# Patient Record
Sex: Female | Born: 1995 | Race: White | Hispanic: No | Marital: Married | Smoking: Former smoker
Health system: Southern US, Community
[De-identification: ages and names within clinical notes are randomized; demographics above are authoritative.]

## PROBLEM LIST (undated history)

## (undated) ENCOUNTER — Inpatient Hospital Stay: Payer: Self-pay

## (undated) DIAGNOSIS — J309 Allergic rhinitis, unspecified: Secondary | ICD-10-CM

## (undated) DIAGNOSIS — N83209 Unspecified ovarian cyst, unspecified side: Secondary | ICD-10-CM

## (undated) DIAGNOSIS — R102 Pelvic and perineal pain unspecified side: Secondary | ICD-10-CM

## (undated) DIAGNOSIS — Z8619 Personal history of other infectious and parasitic diseases: Secondary | ICD-10-CM

## (undated) DIAGNOSIS — N939 Abnormal uterine and vaginal bleeding, unspecified: Secondary | ICD-10-CM

## (undated) HISTORY — DX: Pelvic and perineal pain: R10.2

## (undated) HISTORY — DX: Unspecified ovarian cyst, unspecified side: N83.209

## (undated) HISTORY — PX: WISDOM TOOTH EXTRACTION: SHX21

## (undated) HISTORY — DX: Allergic rhinitis, unspecified: J30.9

## (undated) HISTORY — PX: TONSILLECTOMY: SUR1361

## (undated) HISTORY — PX: KNEE SURGERY: SHX244

## (undated) HISTORY — DX: Personal history of other infectious and parasitic diseases: Z86.19

## (undated) HISTORY — DX: Abnormal uterine and vaginal bleeding, unspecified: N93.9

## (undated) HISTORY — DX: Pelvic and perineal pain unspecified side: R10.20

---
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
## 2013-09-16 ENCOUNTER — Encounter: Payer: Self-pay | Admitting: *Deleted

## 2013-09-22 ENCOUNTER — Ambulatory Visit: Payer: Self-pay | Admitting: General Surgery

## 2014-03-12 ENCOUNTER — Ambulatory Visit: Payer: Self-pay | Admitting: Nurse Practitioner

## 2014-05-01 NOTE — Op Note (Signed)
PATIENT NAME:  Colleen Lang, Colleen Lang MR#:  [id number] DATE OF BIRTH:  [birth year]  DATE OF PROCEDURE:  05/09/2012  PREOPERATIVE DIAGNOSIS: Patellar subluxation, right knee.   POSTOPERATIVE DIAGNOSIS: Patellar subluxation, right knee.   PROCEDURE: Arthroscopy, lateral release, arthroscopic lateral release with open medial reefing.   ANESTHESIA:  General  SURGEON: Leitha SchullerMichael J. Meliana Canner, MD   DESCRIPTION OF PROCEDURE: The patient was brought to the operating room and after adequate anesthesia was obtained, the right leg was prepped and draped in the usual sterile fashion. Appropriate patient identification and timeout procedures were completed. Using prior arthroscopic portals, an inferolateral portal was made and the arthroscope was introduced. After initial inspection with probing, the medial and lateral menisci were intact as was the anterior cruciate ligament. There are no loose bodies. Looking at the patellofemoral joint, there was significant subluxation of the patella laterally with significant tilt. Lateral release was carried out at this point using the ArthroCare wand and this gave some improvement to allow alignment. The tourniquet was raised at the start of the case. At this point, a medial incision was made approximately 2 cm in length and the medial retinaculum incised. Sutures were placed in a pants-over-vest manner with 0 Ethibond to tighten the medial capsule. Looking at this through the arthroscope before tying, it did appear that the patella was in the midline without patellar tilt. The sutures were tightened and then oversewn with #1 Vicryl, 3-0 Vicryl subcutaneously with subcuticular 4-0 Monocryl and Dermabond for the skin incisions. Pre- and postprocedure pictures were obtained,   ESTIMATED BLOOD LOSS: Minimal.   COMPLICATIONS: None.   SPECIMEN: None.   IMPLANTS: None.   TOURNIQUET TIME: 33 minutes at 300 mmHg.     ____________________________ Leitha SchullerMichael J. Rhona Fusilier,  MD mjm:cc Lang: 05/09/2012 18:43:41 ET T: 05/09/2012 22:47:50 ET JOB#: [id number]  cc: Leitha SchullerMichael J. Aquila Delaughter, MD, <Dictator> Leitha SchullerMICHAEL J Juron Vorhees MD ELECTRONICALLY SIGNED 05/11/2012 14:18

## 2014-08-19 ENCOUNTER — Encounter: Payer: Self-pay | Admitting: Obstetrics and Gynecology

## 2014-08-19 ENCOUNTER — Ambulatory Visit (INDEPENDENT_AMBULATORY_CARE_PROVIDER_SITE_OTHER): Payer: Medicaid Other | Admitting: Obstetrics and Gynecology

## 2014-08-19 VITALS — BP 119/76 | HR 103 | Ht 66.0 in | Wt 135.9 lb

## 2014-08-19 DIAGNOSIS — N926 Irregular menstruation, unspecified: Secondary | ICD-10-CM | POA: Diagnosis not present

## 2014-08-19 DIAGNOSIS — Z8742 Personal history of other diseases of the female genital tract: Secondary | ICD-10-CM | POA: Diagnosis not present

## 2014-08-19 DIAGNOSIS — R102 Pelvic and perineal pain unspecified side: Secondary | ICD-10-CM

## 2014-08-19 DIAGNOSIS — N921 Excessive and frequent menstruation with irregular cycle: Secondary | ICD-10-CM

## 2014-08-19 LAB — POCT URINALYSIS DIPSTICK
Bilirubin, UA: NEGATIVE
Blood, UA: NEGATIVE
Glucose, UA: NEGATIVE
Ketones, UA: NEGATIVE
NITRITE UA: NEGATIVE
Protein, UA: NEGATIVE
SPEC GRAV UA: 1.02
UROBILINOGEN UA: 0.2
pH, UA: 6

## 2014-08-19 LAB — POCT URINE PREGNANCY: PREG TEST UR: NEGATIVE

## 2014-08-19 MED ORDER — LEVONORGESTREL-ETHINYL ESTRAD 0.15-30 MG-MCG PO TABS
1.0000 | ORAL_TABLET | Freq: Every day | ORAL | Status: DC
Start: 2014-08-19 — End: 2014-10-22

## 2014-08-19 NOTE — Progress Notes (Signed)
Subjective:     Patient ID: Colleen Lang is a 19 y.o. female.  Is a referral from [facility].   Chief Complaint: Dysfunctional Uterine Bleeding Patient complains of irregular menses. She had been experiencing bleeding regularly. After her last Depo Provera injection (sometime in January 2016) she began experiencing daily bleeding x 3 months.  Reports having an ultrasound done, which noted that she had an ovarian cyst (cannot recall which side).  Notes that after the Depo injection, patient was switched to OCPs (Sprintec) in March 2016.   While  On OCPs, notes having 6 periods, lasting 5 days, over the course of 2 months. Also began noting increasing dysmenorrhea.  Reports cycles are moderate in flow, without passage of clots.  (Patient's last menstrual period was 07/19/2014 (approximate).  Reports negative UPT during workup last month with PCP. Does report recent intercourse ~ 3 days ago.   Additional Complaints: Abdominal Pain Patient also with complaints for evaluation of abdominal pain. The pain is described as dull and pressure-like, and is 6/10 in intensity. Pain is located in the deep pelvis area, left>right, without radiation. Onset was gradual occurring 1 month ago. Symptoms have been unchanged since. Aggravating factors: intercourse. Alleviating factors: none. Associated symptoms: none. The patient denies anorexia, constipation, diarrhea, dysuria, fever, frequency, nausea and vomiting.   Menstrual History: Obstetric History   G1   P1   T1   P0   A0   TAB0   SAB0   E0   M0   L1     # Outcome Date GA Lbr Len/2nd Weight Sex Delivery Anes PTL Lv  1 Term 2014   9 lb 1.6 oz (4.128 kg) M Vag-Spont   Y      Menarche age: 1  Patient's last menstrual period was 07/19/2014 (approximate).   H/o chlamydia 2 years ago, treated.   Past Medical History  Diagnosis Date  . Abnormal uterine bleeding (AUB)     x 4 months  . Pelvic pain in female   . AR (allergic rhinitis)   .  Ovarian cyst     seen on u/s 6 months ago- per pt they were small  . Breast feeding status of mother     breast feeding 73 year old  . History of chlamydia infection 2014   Past Surgical History  Procedure Laterality Date  . Knee surgery      x2  . Wisdom tooth extraction    . Tonsillectomy     Family History  Problem Relation Age of Onset  . Diabetes Paternal Grandmother   . Diabetes Paternal Grandfather   . Cancer Neg Hx   . Ovarian cancer Neg Hx   . Breast cancer Neg Hx   . Heart disease Neg Hx    Social History   Social History  . Marital Status: Single    Spouse Name: N/A  . Number of Children: N/A  . Years of Education: N/A   Occupational History  . Not on file.   Social History Main Topics  . Smoking status: Light Tobacco Smoker -- 0.25 packs/day    Types: Cigarettes  . Smokeless tobacco: Not on file  . Alcohol Use: No  . Drug Use: No  . Sexual Activity: Yes    Birth Control/ Protection: Pill   Other Topics Concern  . Not on file   Social History Narrative    Outpatient Encounter Prescriptions as of 08/19/2014  Medication Sig Note  . cetirizine (ZYRTEC) 10 MG  tablet TAKE 1 TABLET ORALLY NIGHTLY AT BEDTIME FOR ALLERGY SYMPTOMS 08/19/2014: Received from: External Pharmacy  . SPRINTEC 28 0.25-35 MG-MCG tablet TAKE 1 TABLET BY MOUTH AT THE SAME TIME EACH DAY 08/19/2014: Received from: External Pharmacy    Allergies  Allergen Reactions  . Ultram [Tramadol Hcl] Anaphylaxis    Hard to breathe    Review of Systems Constitutional: negative for chills, fatigue, fevers and sweats Eyes: negative for irritation, redness and visual disturbance Ears, nose, mouth, throat, and face: negative for hearing loss, nasal congestion, snoring and tinnitus Respiratory: negative for asthma, cough, sputum Cardiovascular: negative for chest pain, dyspnea, exertional chest pressure/discomfort, irregular heart beat, palpitations and syncope Gastrointestinal: positive for  abdominal pain, Negative for change in bowel habits, nausea and vomiting Genitourinary: positive for abnormal menstrual periods, painful intercourse, genital lesions, sexual problems and vaginal discharge, dysuria and urinary incontinence Integument/breast: negative for breast lump, breast tenderness and nipple discharge Hematologic/lymphatic: negative for bleeding and easy bruising Musculoskeletal:negative for back pain and muscle weakness Neurological: negative for dizziness, headaches, vertigo and weakness Endocrine: negative for diabetic symptoms including polydipsia, polyuria and skin dryness Allergic/Immunologic: negative for hay fever and urticaria     Objective:    BP 119/76 mmHg  Pulse 103  Ht  (1.676 m)  Wt 135 lb 14.4 oz (61.644 kg)  BMI 21.95 kg/m2  LMP 07/19/2014 (Approximate) General appearance: alert and no distress Abdomen: soft, non-tender; bowel sounds normal; no masses,  no organomegaly  Pelvic: cervix normal in appearance, external genitalia normal, no cervical motion tenderness, positive findings: adnexal fullness and tenderness on left side. , rectovaginal septum normal, uterus normal size, shape, and consistency and vagina normal without discharge Extremities: extremities normal, atraumatic, no cyanosis or edema  Skin: Skin color, texture, turgor normal. No rashes or lesions Neurologic: Grossly normal    Assessment:     1. Pelvic pain in female   2. Irregular menses   3. Menometrorrhagia   4. History of ovarian cyst         Plan:   1. Pelvic pain - intermittent.  Advised to continue taking NSAIDs as needed.  2. Irregular menses - patient switched from Depo Provera, with irregular cycles, initiated on Sprintec, still with irregular bleeding. No ultrasound report received from referring PCP.  Will get records and compare with new ultrasound ordered. Switched from [employer] to [facility] [facility].  3. H/o ovarian cyst - patient cannot recall which side cyst was on.   Will note on ultrasound, if still present.  4. RTC in 1-2 weeks after ultrasound.    Hildred Laser, MD [facility] 08/19/2014 10:48 PM

## 2014-08-19 NOTE — Progress Notes (Deleted)
GYNECOLOGY PROGRESS NOTE  Subjective:    Patient ID: Colleen Lang, female    DOB: May 23, 1995, 19 y.o.   MRN: [id number]  HPI  Patient is a 19 y.o. G63P1001 female who presents for   {Common ambulatory SmartLinks:19316}  Review of Systems {ros; complete:30496}   Objective:   Blood pressure 119/76, pulse 103, height  (1.676 m), weight 135 lb 14.4 oz (61.644 kg), last menstrual period 07/19/2014. General appearance: {general exam:16600} Abdomen: {abdominal exam:16834} Pelvic: {pelvic exam:16852::"cervix normal in appearance","external genitalia normal","no adnexal masses or tenderness","no cervical motion tenderness","rectovaginal septum normal","uterus normal size, shape, and consistency","vagina normal without discharge"} Extremities: {extremity exam:5109} Neurologic: {neuro exam:17854}   Assessment:    Plan:

## 2014-08-19 NOTE — Progress Notes (Signed)
Patient ID: Colleen Lang, female   DOB: 02/15/1995, 19 y.o.   MRN: [id number] Refer from [facility]- BTB x 4-5 months- no cycle x 1 month U/s 3-4 months ago showed small ovarian cyst Using sprintec x 3 months previously on depo C/o of pelvic pain-  D/c- creamy, white, no odor- at times

## 2014-08-20 ENCOUNTER — Encounter: Payer: Self-pay | Admitting: Nurse Practitioner

## 2014-08-20 LAB — GC/CHLAMYDIA PROBE AMP
Chlamydia trachomatis, NAA: NEGATIVE
Neisseria gonorrhoeae by PCR: NEGATIVE

## 2014-08-20 LAB — URINE CULTURE: ORGANISM ID, BACTERIA: NO GROWTH

## 2014-08-21 ENCOUNTER — Ambulatory Visit: Payer: Medicaid Other

## 2014-08-21 DIAGNOSIS — R102 Pelvic and perineal pain: Secondary | ICD-10-CM | POA: Diagnosis not present

## 2014-08-21 DIAGNOSIS — N926 Irregular menstruation, unspecified: Secondary | ICD-10-CM | POA: Diagnosis not present

## 2014-08-26 ENCOUNTER — Telehealth: Payer: Self-pay | Admitting: Obstetrics and Gynecology

## 2014-08-26 NOTE — Telephone Encounter (Signed)
PT CAME IN LAST WEEK AND SAW YOU AND THEN CAME IN LAST Friday FOR [place] AND SHE WAS JUST WONDERING ABOUT THE RESULTS OF THE [place]

## 2014-08-26 NOTE — Telephone Encounter (Signed)
Patient was supposed to have an upcoming appointment either this week or  in the next week to discuss her results per my last note, but I don't see one scheduled.  Her ultrasound is negative, but she was supposed to be following up to discuss what to do regarding her abnormal bleeding.  We can inform her of her results, and then have her come in sometime in the next few weeks (non-urgent).

## 2014-08-27 NOTE — Telephone Encounter (Signed)
Called patient and let her know of the results and she is going to call in tomorrow and set appt up when she gets her work schedule

## 2014-08-27 NOTE — Telephone Encounter (Signed)
Ok. Thank you.

## 2014-09-02 ENCOUNTER — Ambulatory Visit (INDEPENDENT_AMBULATORY_CARE_PROVIDER_SITE_OTHER): Payer: Medicaid Other | Admitting: Obstetrics and Gynecology

## 2014-09-02 VITALS — BP 97/67 | HR 89 | Resp 16 | Ht 66.0 in | Wt 137.0 lb

## 2014-09-02 DIAGNOSIS — R102 Pelvic and perineal pain unspecified side: Secondary | ICD-10-CM

## 2014-09-02 DIAGNOSIS — Z8742 Personal history of other diseases of the female genital tract: Secondary | ICD-10-CM

## 2014-09-02 DIAGNOSIS — N926 Irregular menstruation, unspecified: Secondary | ICD-10-CM | POA: Diagnosis not present

## 2014-09-02 NOTE — Progress Notes (Signed)
GYNECOLOGY PROGRESS NOTE  Subjective:    Patient ID: Colleen Lang, female    DOB: 1995-09-17, 19 y.o.   MRN: [id number]  HPI  Patient is a 19 y.o. G42P1001 female who presents for f/u after ultrasound for pelvic pain, f/u of h/o ovarian cyst (patient could not recall which side) and irregular menses. Was switched from Sprintec to [place] last visit.  Has not had a menses yet on new meds.   The following portions of the patient's history were reviewed and updated as appropriate: allergies, current medications, past family history, past medical history, past social history, past surgical history and problem list.  Review of Systems Pertinent items are noted in HPI.   Objective:   Blood pressure 97/67, pulse 89, resp. rate 16, height  (1.676 m), weight 137 lb (62.143 kg), last menstrual period 07/19/2014. General appearance: alert and no distress Remainder of exam deferred.    Imaging:  Pelvic Ultrasound 08/21/2014    Findings:  The uterus measures 10.2 x 6.4 x 4.3 cm. Echo texture is homogenous without evidence of focal masses.  The Endometrium measures 11.1 mm.  Right Ovary measures 3.4 x 1.9 x 2.0 cm. It is normal in appearance. Left Ovary measures 4.0 x 2.0 x 2.5 cm. It is normal appearance. Survey of the adnexa demonstrates no adnexal masses. There is a small free fluid in the cul de sac.  Assessment:   Pelvic pain H/o ovarian cyst (unsure laterality) Irregular menses  Plan:    To continue current pain regimen No ovarian cyst noted on today's sono, has resolved.  Continue Portia (combined OCPs).  To f/u by phone in next several weeks once menses occurs to see if OCPs are helping.    Hildred Laser, MD [facility]

## 2014-10-02 ENCOUNTER — Telehealth: Payer: Self-pay | Admitting: Obstetrics and Gynecology

## 2014-10-02 MED ORDER — DOXYLAMINE-PYRIDOXINE 10-10 MG PO TBEC: 10.0000 mg | DELAYED_RELEASE_TABLET | Freq: Every day | ORAL | Status: DC

## 2014-10-02 NOTE — Telephone Encounter (Signed)
Pt in 5 weeks or so pregnant. She is having trouble sleeping, unable to eat due to nausea. NO vomiting. Feels lite headed at time. Having a pain in upper abd with a lot of heart burn. Able to tolerate liquids. One occasion of brown spotting. Pt aware to eat 5-6 small meals. Avoid greasy/fried foods. Diclegis rx erx. tums for heart burn. Pt aware if sx are not relieved she may want to come in sooner than 10/3 appt.

## 2014-10-02 NOTE — Telephone Encounter (Signed)
Pt is coming 10/3 for nurse ob intake, bur pt has pain in lower abd, both sides and undr ribs, 3 days ago light spotting. The pain is doubling over pain and wants to know can she come in to see Dr Cam Hai than 10/3

## 2014-10-07 ENCOUNTER — Ambulatory Visit: Payer: Medicaid Other | Admitting: Obstetrics and Gynecology

## 2014-10-07 ENCOUNTER — Ambulatory Visit: Payer: Medicaid Other

## 2014-10-07 DIAGNOSIS — Z3490 Encounter for supervision of normal pregnancy, unspecified, unspecified trimester: Secondary | ICD-10-CM

## 2014-10-08 ENCOUNTER — Other Ambulatory Visit: Payer: Self-pay

## 2014-10-08 LAB — URINALYSIS, ROUTINE W REFLEX MICROSCOPIC
BILIRUBIN UA: NEGATIVE
Glucose, UA: NEGATIVE
KETONES UA: NEGATIVE
LEUKOCYTES UA: NEGATIVE
Nitrite, UA: NEGATIVE
PROTEIN UA: NEGATIVE
RBC UA: NEGATIVE
SPEC GRAV UA: 1.016 (ref 1.005–1.030)
Urobilinogen, Ur: 0.2 mg/dL (ref 0.2–1.0)
pH, UA: 7 (ref 5.0–7.5)

## 2014-10-08 LAB — CBC WITH DIFFERENTIAL/PLATELET
BASOS: 0 %
Basophils Absolute: 0 10*3/uL (ref 0.0–0.2)
EOS (ABSOLUTE): 0 10*3/uL (ref 0.0–0.4)
EOS: 0 %
HEMATOCRIT: 37.4 % (ref 34.0–46.6)
HEMOGLOBIN: 13.1 g/dL (ref 11.1–15.9)
IMMATURE GRANS (ABS): 0 10*3/uL (ref 0.0–0.1)
IMMATURE GRANULOCYTES: 0 %
LYMPHS: 20 %
Lymphocytes Absolute: 1.9 10*3/uL (ref 0.7–3.1)
MCH: 33 pg (ref 26.6–33.0)
MCHC: 35 g/dL (ref 31.5–35.7)
MCV: 94 fL (ref 79–97)
MONOCYTES: 7 %
Monocytes Absolute: 0.6 10*3/uL (ref 0.1–0.9)
NEUTROS PCT: 73 %
Neutrophils Absolute: 6.9 10*3/uL (ref 1.4–7.0)
Platelets: 243 10*3/uL (ref 150–379)
RBC: 3.97 x10E6/uL (ref 3.77–5.28)
RDW: 13.3 % (ref 12.3–15.4)
WBC: 9.5 10*3/uL (ref 3.4–10.8)

## 2014-10-08 LAB — HEP, RPR, HIV PANEL
HEP B S AG: NEGATIVE
HIV SCREEN 4TH GENERATION: NONREACTIVE
RPR: NONREACTIVE

## 2014-10-08 LAB — RUBELLA SCREEN: Rubella Antibodies, IGG: 3.92 index (ref >0.99)

## 2014-10-08 LAB — BETA HCG QUANT (REF LAB): hCG Quant: 136610 m[IU]/mL

## 2014-10-08 LAB — ABO AND RH: Rh Factor: POSITIVE

## 2014-10-08 LAB — VARICELLA ZOSTER ANTIBODY, IGG: Varicella zoster IgG: 241 index (ref >165)

## 2014-10-08 LAB — TOXOPLASMA ANTIBODIES- IGG AND  IGM
Toxoplasma Antibody- IgM: <3 AU/mL (ref 0.0–7.9)
Toxoplasma IgG Ratio: <3 IU/mL (ref 0.0–7.1)

## 2014-10-08 LAB — ANTIBODY SCREEN: Antibody Screen: NEGATIVE

## 2014-10-09 ENCOUNTER — Other Ambulatory Visit: Payer: Self-pay | Admitting: Obstetrics and Gynecology

## 2014-10-09 ENCOUNTER — Ambulatory Visit: Payer: Medicaid Other

## 2014-10-09 DIAGNOSIS — Z3490 Encounter for supervision of normal pregnancy, unspecified, unspecified trimester: Secondary | ICD-10-CM

## 2014-10-09 LAB — GC/CHLAMYDIA PROBE AMP
CHLAMYDIA, DNA PROBE: NEGATIVE
Neisseria gonorrhoeae by PCR: NEGATIVE

## 2014-10-09 LAB — URINE CULTURE

## 2014-10-15 ENCOUNTER — Telehealth: Payer: Self-pay

## 2014-10-15 NOTE — Telephone Encounter (Signed)
Had brown spotting a couple weeks ago, and started again now with brown spotting when wipes. No bright red bleeding. Cramping like she wants to have a bowel movement or period like. Bowel movement 2 hrs again and some constipation. Pt reassured. I spoke with Dr. Valentino Saxon and she advised for pt to rest, not lift anything heavy, may feel more "growing pains" with twins, if any bright red vaginal bleeding or cramps more than period like to contact office.

## 2014-10-22 ENCOUNTER — Encounter: Payer: Self-pay | Admitting: Obstetrics and Gynecology

## 2014-10-22 ENCOUNTER — Ambulatory Visit (INDEPENDENT_AMBULATORY_CARE_PROVIDER_SITE_OTHER): Payer: Medicaid Other | Admitting: Obstetrics and Gynecology

## 2014-10-22 VITALS — BP 109/72 | HR 111 | Wt 133.0 lb

## 2014-10-22 DIAGNOSIS — Z3481 Encounter for supervision of other normal pregnancy, first trimester: Secondary | ICD-10-CM | POA: Diagnosis not present

## 2014-10-22 DIAGNOSIS — Z1379 Encounter for other screening for genetic and chromosomal anomalies: Secondary | ICD-10-CM

## 2014-10-22 DIAGNOSIS — O30031 Twin pregnancy, monochorionic/diamniotic, first trimester: Secondary | ICD-10-CM

## 2014-10-22 DIAGNOSIS — Z3491 Encounter for supervision of normal pregnancy, unspecified, first trimester: Secondary | ICD-10-CM

## 2014-10-22 DIAGNOSIS — O30033 Twin pregnancy, monochorionic/diamniotic, third trimester: Secondary | ICD-10-CM | POA: Insufficient documentation

## 2014-10-22 MED ORDER — VITAMIN B-6 25 MG PO TABS: 25.0000 mg | ORAL_TABLET | Freq: Three times a day (TID) | ORAL | Status: DC

## 2014-10-22 MED ORDER — DOXYLAMINE SUCCINATE (SLEEP) 25 MG PO TABS: 12.5000 mg | ORAL_TABLET | Freq: Every day | ORAL | Status: DC

## 2014-10-22 NOTE — Progress Notes (Signed)
OB/GYN INITIAL PRENATAL VISIT Subjective:    Colleen Lang is being seen today for her first obstetrical visit.  This is not a planned pregnancy. Patient was on OCPs at time of discovery of pregnancy (had switched from Depo Provera). She is a 19 y.o. G2P1001 at [redacted]w[redacted]d gestation with twin monochorionic, diamniotic pregnancy, with LMP unknown, Estimated Date of Delivery: 05/05/15 by 10 week sono. Her obstetrical history is significant for none. Relationship with FOB: spouse, living together. Patient does intend to breast feed. Pregnancy history fully reviewed.  Menstrual History: Obstetric History   G2   P1   T1   P0   A0   TAB0   SAB0   E0   M0   L1     # Outcome Date GA Lbr Len/2nd Weight Sex Delivery Anes PTL Lv  2 Current           1 Term 2014   9 lb 1.6 oz (4.128 kg) M Vag-Spont   Y     Menarche age: 18 H/o chlamydia 2 years ago, treated.  No previous pap history.    Past Medical History  Diagnosis Date  . Abnormal uterine bleeding (AUB)     x 4 months  . AR (allergic rhinitis)   . Ovarian cyst     seen on u/s 6 months ago- per pt they were small  . History of chlamydia infection 2014    Past Surgical History  Procedure Laterality Date  . Knee surgery      x2  . Wisdom tooth extraction    . Tonsillectomy      Family History  Problem Relation Age of Onset  . Diabetes Paternal Grandmother   . Diabetes Paternal Grandfather   . Cancer Neg Hx   . Ovarian cancer Neg Hx   . Breast cancer Neg Hx   . Heart disease Neg Hx    Social History   Social History  . Marital Status: Single    Spouse Name: N/A  . Number of Children: N/A  . Years of Education: N/A   Occupational History  . Not on file.   Social History Main Topics  . Smoking status: Light Tobacco Smoker -- 0.25 packs/day    Types: Cigarettes  . Smokeless tobacco: Not on file  . Alcohol Use: No  . Drug Use: No  . Sexual Activity: Yes    Birth Control/ Protection: None   Other Topics Concern  . Not on  file   Social History Narrative    Current Outpatient Prescriptions on File Prior to Visit  Medication Sig Dispense Refill  . cetirizine (ZYRTEC) 10 MG tablet TAKE 1 TABLET ORALLY NIGHTLY AT BEDTIME FOR ALLERGY SYMPTOMS  3   No current facility-administered medications on file prior to visit.    Allergies  Allergen Reactions  . Ultram [Tramadol Hcl] Anaphylaxis    Hard to breathe     Review of Systems General:Not Present- Fever, Weight Loss and Weight Gain. Skin:Not Present- Rash. HEENT:Not Present- Blurred Vision, Headache and Bleeding Gums. Respiratory:Not Present- Difficulty Breathing. Breast:Not Present- Breast Mass. Cardiovascular:Not Present- Chest Pain, Elevated Blood Pressure, Fainting / Blacking Out and Shortness of Breath. Gastrointestinal:Present- Nausea and Vomiting (mild). Not Present- Abdominal Pain, Constipation. Female Genitourinary: Present - vaginal spotting (last week, dark red blood); Not Present- Frequency, Painful Urination, Pelvic Pain, , Vaginal Discharge, Contractions, regular, Fetal Movements Decreased, Urinary Complaints and Vaginal Fluid. Musculoskeletal:Not Present- Back Pain and Leg Cramps. Neurological:Not Present- Dizziness. Psychiatric:Not Present- Depression.  Objective:    BP 109/72 mmHg  Pulse 111  Wt 133 lb (60.328 kg)  LMP  (LMP Unknown)  Body mass index is 21.48 kg/(m^2).  General Appearance:    Alert, cooperative, no distress, appears stated age  Head:    Normocephalic, without obvious abnormality, atraumatic  Eyes:    PERRL, conjunctiva/corneas clear, EOM's intact, both eyes  Ears:    Normal external ear canals, both ears  Nose:   Nares normal, septum midline, mucosa normal, no drainage or sinus tenderness  Throat:   Lips, mucosa, and tongue normal; teeth and gums normal  Neck:   Supple, symmetrical, trachea midline, no adenopathy; thyroid: no enlargement/tenderness/nodules; no carotid bruit or JVD  Back:      Symmetric, no curvature, ROM normal, no CVA tenderness  Lungs:     Clear to auscultation bilaterally, respirations unlabored  Chest Wall:    No tenderness or deformity   Heart:    Regular rate and rhythm, S1 and S2 normal, no murmur, rub or gallop  Breast Exam:    No tenderness, masses, or nipple abnormality  Abdomen:     Soft, non-tender, bowel sounds active all four quadrants, no masses, no organomegaly.  FH 13.  FHT 151 (Twin A), 160 (Twin B)  bpm.  Genitalia:    Pelvic:external genitalia normal, vagina with lesions, discharge, or tenderness, rectovaginal septum  normal. Cervix normal in appearance, no cervical motion tenderness, no adnexal masses or tenderness.  Pregnancy positive findings: uterine enlargement: 13wk size, nontender.   Rectal:    Normal external sphincter.  No hemorrhoids appreciated. Internal exam not done.   Extremities:   Extremities normal, atraumatic, no cyanosis or edema  Pulses:   2+ and symmetric all extremities  Skin:   Skin color, texture, turgor normal, no rashes or lesions  Lymph nodes:   Cervical, supraclavicular, and axillary nodes normal  Neurologic:   CNII-XII intact, normal strength, sensation and reflexes throughout    Assessment:    Pregnancy at 12 and 1/7 weeks   Monochorionic, diamniotic twin gestation Nausea/vomiting of pregnancy (mild)   Plan:   Initial labs reviewed. Prenatal vitamins recommended. Also recommend 1 mg folic acid supplementation for twin gestation. Problem list reviewed and updated. Optional genetic testing discussed, 1st trimester screen: ordered. New OB counseling:  The patient has been given an overview regarding routine prenatal care. Recommendations regarding diet, weight gain, and exercise in pregnancy were given.  Prenatal testing, and ultrasound use in pregnancy were reviewed.  Benefits of Breast Feeding were discussed. The patient is encouraged to consider nursing her baby post partum. Diclegis not covered by Medicaid,  will prescribe Doxylamine and Vitamin B6.  Follow up in 4 weeks or OB visit, 1 week for 1st trimester screen.   Hildred LaserAnika Adynn Caseres, MD [facility]

## 2014-10-23 ENCOUNTER — Encounter: Payer: Self-pay | Admitting: Obstetrics and Gynecology

## 2014-10-23 NOTE — Addendum Note (Signed)
Addended by: Jackquline DenmarkIDGEWAY, Omare Bilotta W on: 10/23/2014 01:07 PM   Modules accepted: Orders

## 2014-10-28 ENCOUNTER — Ambulatory Visit: Payer: Medicaid Other

## 2014-10-28 DIAGNOSIS — Z3491 Encounter for supervision of normal pregnancy, unspecified, first trimester: Secondary | ICD-10-CM

## 2014-10-28 DIAGNOSIS — Z1379 Encounter for other screening for genetic and chromosomal anomalies: Secondary | ICD-10-CM | POA: Diagnosis not present

## 2014-10-28 DIAGNOSIS — Z3481 Encounter for supervision of other normal pregnancy, first trimester: Secondary | ICD-10-CM | POA: Diagnosis not present

## 2014-10-29 ENCOUNTER — Other Ambulatory Visit: Payer: Self-pay | Admitting: Obstetrics and Gynecology

## 2014-10-30 ENCOUNTER — Telehealth: Payer: Self-pay | Admitting: Obstetrics and Gynecology

## 2014-10-30 LAB — PAP IG W/ RFLX HPV ASCU: PAP SMEAR COMMENT: 0

## 2014-10-30 LAB — HPV DNA PROBE HIGH RISK, AMPLIFIED: HPV, high-risk: NEGATIVE

## 2014-10-30 NOTE — Telephone Encounter (Signed)
Pt states she has had a h/a for about 2 weeks on/off. Constant for 4 days. Hurts to look at things. Taking tylenol q 4 not helping. No h/o of mirgaines. She feels ok if she is sleeping or lying down. She states she is sneezing a lot also.  ? Allergies Advsied to push fluids. Take tylenol as directed. May want to add a zyrtec. If no better in a few days she will need to be seen. Pt voices understanding.

## 2014-10-30 NOTE — Telephone Encounter (Signed)
PT CALLED AND SHE IS PREGNANT WITH TWINS, IN [place], SHE HAS HAD A MIGRAIN FOR 4 DAYS AND TODAY IT IS THE WORSE MAKING HER VISION BLURRY, SHE WANTED TO KNOW WHAT SHE CAN TAKE.

## 2014-10-31 LAB — FIRST TRIMESTER SCREEN W/NT
CRL: 67.1 mm
CROWN RUMP LENGTH TWIN B: 69.6 mm
DIA MOM: 2
DIA VALUE: 474.7 pg/mL
GEST AGE-COLLECT: 13.1 wk
HCG MOM: 2.2
Maternal Age At EDD: 21.5 years
NT MoM Twin B: 1.44
NT TWIN B: 1.8 mm
NUCHAL TRANSLUCENCY MOM: 1.22
NUMBER OF FETUSES: 2
Nuchal Translucency: 1.5 mm
PAPP-A MOM: 1.32
PAPP-A Value: 1914.8 ng/mL
WEIGHT: 133 [lb_av]
hCG Value: 196.9 IU/mL

## 2014-11-04 ENCOUNTER — Telehealth: Payer: Self-pay | Admitting: Obstetrics and Gynecology

## 2014-11-04 DIAGNOSIS — G43001 Migraine without aura, not intractable, with status migrainosus: Secondary | ICD-10-CM

## 2014-11-04 MED ORDER — ACETAMINOPHEN-CODEINE #3 300-30 MG PO TABS: 1.0000 | ORAL_TABLET | Freq: Four times a day (QID) | ORAL | Status: DC | PRN

## 2014-11-04 NOTE — Telephone Encounter (Signed)
Pt is [redacted] wks pregnant with twins, she is still having migraines and has done all she was told to do. She said it is unbareable at this point. She wants to know what is the next step.

## 2014-11-04 NOTE — Telephone Encounter (Signed)
Can have T#3 prescribed for migraines.  Can call in to pharmacy.  #30, 1 refill, q 4-6 hrs as needed.  May take 1-2 tablets.

## 2014-11-04 NOTE — Telephone Encounter (Signed)
Patient notified, medication was called in to the pharmacy

## 2014-11-19 ENCOUNTER — Other Ambulatory Visit: Payer: Self-pay | Admitting: Obstetrics and Gynecology

## 2014-11-19 ENCOUNTER — Ambulatory Visit (INDEPENDENT_AMBULATORY_CARE_PROVIDER_SITE_OTHER): Payer: Medicaid Other | Admitting: Obstetrics and Gynecology

## 2014-11-19 VITALS — BP 109/71 | HR 118 | Wt 140.3 lb

## 2014-11-19 DIAGNOSIS — O30032 Twin pregnancy, monochorionic/diamniotic, second trimester: Secondary | ICD-10-CM

## 2014-11-19 DIAGNOSIS — Z3492 Encounter for supervision of normal pregnancy, unspecified, second trimester: Secondary | ICD-10-CM

## 2014-11-19 LAB — POCT URINALYSIS DIPSTICK
Bilirubin, UA: NEGATIVE
Glucose, UA: NEGATIVE
Ketones, UA: NEGATIVE
Leukocytes, UA: NEGATIVE
Nitrite, UA: NEGATIVE
Protein, UA: NEGATIVE
Spec Grav, UA: >=1.03
Urobilinogen, UA: NEGATIVE
pH, UA: 6

## 2014-11-19 NOTE — Progress Notes (Signed)
ROB: Patient doing well, notes nausea/vomiting has resolved.  For serum AFP today.  RTC in 4 weeks.  For anatomy scan at that time.

## 2014-11-25 LAB — AFP, QUAD SCREEN
DIA Mom Value: 1.46
DIA Value (EIA): 268.93 pg/mL
DSR (By Age)    1 IN: 1139
DSR (SECOND TRIMESTER) 1 IN: 5475
GESTATIONAL AGE AFP: 16.1 wk
MATERNAL AGE AT EDD: 21.5 a
MSAFP MOM: 1.18
MSAFP: 40.2 ng/mL
MSHCG MOM: 1.23
MSHCG: 51973 m[IU]/mL
Osb Risk: 6916
T18 (By Age): 1:4436 {titer}
Test Results:: NEGATIVE
WEIGHT: 140 [lb_av]
uE3 Mom: 1.59
uE3 Value: 1.41 ng/mL

## 2014-11-27 ENCOUNTER — Telehealth: Payer: Self-pay

## 2014-11-27 NOTE — Telephone Encounter (Signed)
-----   Message from Hildred LaserAnika Cherry, MD sent at 11/25/2014 12:10 PM EST ----- Please inform patient of normal AFP screen

## 2014-11-27 NOTE — Telephone Encounter (Signed)
Pt informed of normal AFP results.

## 2014-12-06 ENCOUNTER — Emergency Department
Admission: EM | Admit: 2014-12-06 | Discharge: 2014-12-06 | Disposition: A | Discharge status: Home / Self Care | Payer: Medicaid Other | Attending: Emergency Medicine | Admitting: Emergency Medicine

## 2014-12-06 ENCOUNTER — Encounter: Payer: Self-pay | Admitting: Emergency Medicine

## 2014-12-06 DIAGNOSIS — O9989 Other specified diseases and conditions complicating pregnancy, childbirth and the puerperium: Secondary | ICD-10-CM | POA: Insufficient documentation

## 2014-12-06 DIAGNOSIS — Z3A19 19 weeks gestation of pregnancy: Secondary | ICD-10-CM | POA: Insufficient documentation

## 2014-12-06 DIAGNOSIS — Z87891 Personal history of nicotine dependence: Secondary | ICD-10-CM | POA: Insufficient documentation

## 2014-12-06 DIAGNOSIS — R55 Syncope and collapse: Secondary | ICD-10-CM | POA: Diagnosis not present

## 2014-12-06 DIAGNOSIS — Z79899 Other long term (current) drug therapy: Secondary | ICD-10-CM | POA: Diagnosis not present

## 2014-12-06 DIAGNOSIS — R000 Tachycardia, unspecified: Secondary | ICD-10-CM | POA: Insufficient documentation

## 2014-12-06 LAB — COMPREHENSIVE METABOLIC PANEL
ALBUMIN: 3.5 g/dL (ref 3.5–5.0)
ALT: 11 U/L — ABNORMAL LOW (ref 14–54)
ANION GAP: 6 (ref 5–15)
AST: 14 U/L — AB (ref 15–41)
Alkaline Phosphatase: 43 U/L (ref 38–126)
BILIRUBIN TOTAL: 0.2 mg/dL — AB (ref 0.3–1.2)
BUN: 9 mg/dL (ref 6–20)
CHLORIDE: 105 mmol/L (ref 101–111)
CO2: 25 mmol/L (ref 22–32)
Calcium: 9 mg/dL (ref 8.9–10.3)
Creatinine, Ser: 0.58 mg/dL (ref 0.44–1.00)
GFR calc Af Amer: >60 mL/min (ref >60)
GFR calc non Af Amer: >60 mL/min (ref >60)
GLUCOSE: 87 mg/dL (ref 65–99)
POTASSIUM: 3.8 mmol/L (ref 3.5–5.1)
SODIUM: 136 mmol/L (ref 135–145)
TOTAL PROTEIN: 6.5 g/dL (ref 6.5–8.1)

## 2014-12-06 LAB — CBC
HEMATOCRIT: 35.2 % (ref 35.0–47.0)
HEMOGLOBIN: 11.9 g/dL — AB (ref 12.0–16.0)
MCH: 32.7 pg (ref 26.0–34.0)
MCHC: 33.8 g/dL (ref 32.0–36.0)
MCV: 96.7 fL (ref 80.0–100.0)
PLATELETS: 231 10*3/uL (ref 150–440)
RBC: 3.64 MIL/uL — AB (ref 3.80–5.20)
RDW: 13.8 % (ref 11.5–14.5)
WBC: 11.4 10*3/uL — ABNORMAL HIGH (ref 3.6–11.0)

## 2014-12-06 LAB — URINALYSIS COMPLETE WITH MICROSCOPIC (ARMC ONLY)
Bilirubin Urine: NEGATIVE
Glucose, UA: NEGATIVE mg/dL
Hgb urine dipstick: NEGATIVE
KETONES UR: NEGATIVE mg/dL
LEUKOCYTES UA: NEGATIVE
Nitrite: NEGATIVE
PH: 7 (ref 5.0–8.0)
PROTEIN: NEGATIVE mg/dL
SPECIFIC GRAVITY, URINE: 1.002 — AB (ref 1.005–1.030)

## 2014-12-06 LAB — HCG, QUANTITATIVE, PREGNANCY: HCG, BETA CHAIN, QUANT, S: 40043 m[IU]/mL — AB (ref <5)

## 2014-12-06 MED ORDER — SODIUM CHLORIDE 0.9 % IV BOLUS (SEPSIS)
1000.0000 mL | INTRAVENOUS | Status: AC
  Administered 2014-12-06: 1000 mL via INTRAVENOUS

## 2014-12-06 NOTE — ED Notes (Signed)
Pt states she has had 2 episodes of dizziness since becoming pregnant. Denies any dizziness at this time.

## 2014-12-06 NOTE — ED Provider Notes (Signed)
[facility] Emergency Department Provider Note  ____________________________________________  Time seen: Approximately 7:02 PM  I have reviewed the triage vital signs and the nursing notes.   HISTORY  Chief Complaint Near Syncope    HPI Colleen Lang is a 19 y.o. female G2 P1 at approximately 6419 weeks gestation with a twin pregnancy [facility].  She presents because she has had 2 episodes of near-syncope today.  She denies fever/chills, chest pain, shortness of breath, abdominal pain, pelvic pain, vaginal bleeding, dysuria.  She has had a little bit of nasal congestion recently and a mild nonproductive cough but otherwise has been feeling fine.  She did not completely pass out and did not have any fall or other injury.  He describes her symptoms as mild, acute onset and intermittent.  When eating and drinking normally recently.  She has no other significant past medical history.   Past Medical History  Diagnosis Date  . Abnormal uterine bleeding (AUB)     x 4 months  . Pelvic pain in female   . AR (allergic rhinitis)   . Ovarian cyst     seen on u/s 6 months ago- per pt they were small  . Breast feeding status of mother     breast feeding 19 year old  . History of chlamydia infection 2014    Patient Active Problem List   Diagnosis Date Noted  . Monochorionic diamniotic twin gestation in first trimester 10/22/2014  . Menometrorrhagia 08/19/2014  . History of ovarian cyst 08/19/2014    Past Surgical History  Procedure Laterality Date  . Knee surgery      x2  . Wisdom tooth extraction    . Tonsillectomy      Current Outpatient Rx  Name  Route  Sig  Dispense  Refill  . acetaminophen-codeine (TYLENOL #3) 300-30 MG tablet   Oral   Take 1 tablet by mouth every 6 (six) hours as needed for moderate pain.   30 tablet   1   . cetirizine (ZYRTEC) 10 MG tablet      TAKE 1 TABLET ORALLY NIGHTLY AT BEDTIME FOR ALLERGY  SYMPTOMS      3   . doxylamine, Sleep, (UNISOM) 25 MG tablet   Oral   Take 0.5 tablets (12.5 mg total) by mouth at bedtime. Patient not taking: Reported on 11/19/2014   30 tablet   0   . Prenatal Vit-Fe Fumarate-FA (PRENATAL VITAMIN PO)   Oral   Take by mouth.         . vitamin B-6 (PYRIDOXINE) 25 MG tablet   Oral   Take 1 tablet (25 mg total) by mouth 3 (three) times daily. Patient not taking: Reported on 11/19/2014   90 tablet   1     Dispense as written.     Allergies Ultram and Other  Family History  Problem Relation Age of Onset  . Diabetes Paternal Grandmother   . Diabetes Paternal Grandfather   . Cancer Neg Hx   . Ovarian cancer Neg Hx   . Breast cancer Neg Hx   . Heart disease Neg Hx     Social History Social History  Substance Use Topics  . Smoking status: Former Smoker -- 0.25 packs/day    Types: Cigarettes  . Smokeless tobacco: None  . Alcohol Use: No    Review of Systems Constitutional: No fever/chills Eyes: No visual changes. ENT: No sore throat. Cardiovascular: Denies chest pain. Respiratory: Denies shortness of  breath. Gastrointestinal: No abdominal pain.  No nausea, no vomiting.  No diarrhea.  No constipation. Genitourinary: Negative for dysuria. Musculoskeletal: Negative for back pain. Skin: Negative for rash. Neurological: Negative for headaches, focal weakness or numbness.  2 episodes of near-syncope today  10-point ROS otherwise negative.  ____________________________________________   PHYSICAL EXAM:  VITAL SIGNS: ED Triage Vitals  Enc Vitals Group     BP 12/06/14 1720 120/76 mmHg     Pulse Rate 12/06/14 1720 117     Resp 12/06/14 1720 20     Temp 12/06/14 1720 98.1 F (36.7 C)     Temp Source 12/06/14 1720 Oral     SpO2 12/06/14 1720 97 %     Weight 12/06/14 1720 145 lb (65.772 kg)     Height 12/06/14 1720  (1.702 m)     Head Cir --      Peak Flow --      Pain Score --      Pain Loc --      Pain Edu? --       Excl. in GC? --     Constitutional: Alert and oriented. Well appearing and in no acute distress. Eyes: Conjunctivae are normal. PERRL. EOMI. Head: Atraumatic. Nose: No congestion/rhinnorhea that I can appreciate on exam. Mouth/Throat: Mucous membranes are moist.  Oropharynx non-erythematous. Neck: No stridor.   Cardiovascular: Mild tachycardia, regular rhythm. Grossly normal heart sounds.  Good peripheral circulation. Respiratory: Normal respiratory effort.  No retractions. Lungs CTAB. Gastrointestinal: Soft and nontender. No distention. No abdominal bruits. No CVA tenderness. Musculoskeletal: No lower extremity tenderness nor edema.  No joint effusions. Neurologic:  Normal speech and language. No gross focal neurologic deficits are appreciated.  Skin:  Skin is warm, dry and intact. No rash noted.   ____________________________________________   LABS (all labs ordered are listed, but only abnormal results are displayed)  Labs Reviewed  CBC - Abnormal; Notable for the following:    WBC 11.4 (*)    RBC 3.64 (*)    Hemoglobin 11.9 (*)    All other components within normal limits  HCG, QUANTITATIVE, PREGNANCY - Abnormal; Notable for the following:    hCG, Beta Chain, Quant, S 40043 (*)    All other components within normal limits  COMPREHENSIVE METABOLIC PANEL - Abnormal; Notable for the following:    AST 14 (*)    ALT 11 (*)    Total Bilirubin 0.2 (*)    All other components within normal limits  URINALYSIS COMPLETEWITH MICROSCOPIC ([facility] ONLY) - Abnormal; Notable for the following:    Color, Urine STRAW (*)    APPearance CLEAR (*)    Specific Gravity, Urine 1.002 (*)    Bacteria, UA RARE (*)    Squamous Epithelial / LPF 6-30 (*)    All other components within normal limits   ____________________________________________  EKG  Not indicated ____________________________________________  RADIOLOGY   No results  found.  ____________________________________________   PROCEDURES  Procedure(s) performed: None  Critical Care performed: No ____________________________________________   INITIAL IMPRESSION / ASSESSMENT AND PLAN / ED COURSE  Pertinent labs & imaging results that were available during my care of the patient were reviewed by me and considered in my medical decision making (see chart for details).  The patient has mild tachycardia and 2 near syncopal episodes today at [redacted] weeks gestation with a twin pregnancy.  She has no other signs or symptoms that are concerning for serious or emergent medical condition.  I am giving her  a liter of normal saline while checking basic labs and I will reassess, but I suspect her symptoms are result of mild volume depletion.  The patient understands and agrees with this plan of care.  ----------------------------------------- 8:07 PM on 12/06/2014 -----------------------------------------  Labs unremarkable.  Patient states she feels better after a liter of fluids.  I gave my usual and customary return precautions.     ____________________________________________  FINAL CLINICAL IMPRESSION(S) / ED DIAGNOSES  Final diagnoses:  Near syncope      NEW MEDICATIONS STARTED DURING THIS VISIT:  New Prescriptions   No medications on file     Loleta Rose, MD 12/06/14 2007

## 2014-12-06 NOTE — Discharge Instructions (Signed)
You have been seen today in the Emergency Department (ED)  for nearly passing out.  Your workup was reassuring.  Your symptoms may be due to dehydration, so it is important that you drink plenty of non-alcoholic fluids.  We gave you a liter of IV fluids in the Emergency Department.  Please call your regular doctor as soon as possible to schedule the next available clinic appointment to follow up with him/her regarding your visit to the ED and your symptoms.  Return to the Emergency Department (ED)  if you develop any new or worsening symptoms that concern you.

## 2014-12-06 NOTE — ED Notes (Signed)
States had nausea while at store, felt weak in line but no syncope, second episode at home. [redacted] weeks pregnant with twins.

## 2014-12-08 ENCOUNTER — Ambulatory Visit (INDEPENDENT_AMBULATORY_CARE_PROVIDER_SITE_OTHER): Payer: Medicaid Other | Admitting: Obstetrics and Gynecology

## 2014-12-08 VITALS — BP 96/60 | HR 101 | Wt 147.0 lb

## 2014-12-08 DIAGNOSIS — O2653 Maternal hypotension syndrome, third trimester: Secondary | ICD-10-CM | POA: Insufficient documentation

## 2014-12-08 DIAGNOSIS — E860 Dehydration: Secondary | ICD-10-CM | POA: Diagnosis not present

## 2014-12-08 DIAGNOSIS — O2652 Maternal hypotension syndrome, second trimester: Secondary | ICD-10-CM | POA: Diagnosis not present

## 2014-12-08 LAB — POCT URINALYSIS DIPSTICK
Bilirubin, UA: NEGATIVE
Glucose, UA: NEGATIVE
Ketones, UA: NEGATIVE
NITRITE UA: NEGATIVE
PH UA: 7
Protein, UA: NEGATIVE
SPEC GRAV UA: 1.015
UROBILINOGEN UA: NEGATIVE

## 2014-12-08 NOTE — Progress Notes (Signed)
Problem OB:  Patient following up from an ER visit in which [place] [employer] [place]  Patient states that while seen in the ER she was noted to be dehydrated (received IVF) and BPs were elevated. Review of notes and vitals today note opposite (low BPs).  Discussed hypotension in pregnancy, staying adequately hydrated, being aware of recurrent symptoms and sitting down quickly when onset of symptoms occurs.  RTC in 2 weeks for regularly scheduled visit and anatomy scan.

## 2014-12-08 NOTE — Patient Instructions (Signed)

## 2014-12-18 ENCOUNTER — Ambulatory Visit (INDEPENDENT_AMBULATORY_CARE_PROVIDER_SITE_OTHER): Payer: Medicaid Other

## 2014-12-18 ENCOUNTER — Ambulatory Visit (INDEPENDENT_AMBULATORY_CARE_PROVIDER_SITE_OTHER): Payer: Medicaid Other | Admitting: Obstetrics and Gynecology

## 2014-12-18 VITALS — BP 102/63 | HR 92 | Wt 140.4 lb

## 2014-12-18 DIAGNOSIS — Z3482 Encounter for supervision of other normal pregnancy, second trimester: Secondary | ICD-10-CM

## 2014-12-18 DIAGNOSIS — O30032 Twin pregnancy, monochorionic/diamniotic, second trimester: Secondary | ICD-10-CM | POA: Diagnosis not present

## 2014-12-18 DIAGNOSIS — Z3492 Encounter for supervision of normal pregnancy, unspecified, second trimester: Secondary | ICD-10-CM

## 2014-12-18 LAB — POCT URINALYSIS DIPSTICK
Bilirubin, UA: NEGATIVE
Blood, UA: NEGATIVE
Glucose, UA: NEGATIVE
KETONES UA: NEGATIVE
NITRITE UA: NEGATIVE
PH UA: 8.5
PROTEIN UA: NEGATIVE
Spec Grav, UA: 1.01
UROBILINOGEN UA: NEGATIVE

## 2014-12-18 NOTE — Progress Notes (Signed)
ROB: Doing well, no complaints.  S/p normal anatomy scan today.  RTC in 4 weeks.  Will need to begin growth scans and weekly NSTs for mono-di twin pregnancy.

## 2015-01-10 NOTE — L&D Delivery Note (Signed)
Delivery Summary for Margareth D Abdella  Labor Events:   Preterm labor:   Rupture date:   Rupture time:   Rupture type: Artificial  Fluid Color: Clear  Induction:   Augmentation:   Complications:   Cervical ripening:          Delivery:   Episiotomy:   Lacerations:   Repair suture:   Repair # of packets:   Blood loss (ml): 250   Information for the patient's newborn:  Karlyne GreenspanReams, GirlA Britney [[id number]][[id number]]    Delivery 04/09/2015 5:13 PM by  Vaginal, Spontaneous Delivery Sex:  female Gestational Age: 5256w2d Delivery Clinician:  Hildred LaserAnika Sidra [place] Living?: Yes        APGARS  One minute Five minutes Ten minutes  Skin color: 1   1      Heart rate: 2   2      Grimace: 2   2      Muscle tone: 2   2      Breathing: 2   2      Totals: 9  9      Presentation/position: Vertex     Resuscitation: None  Cord information: 3 vessels   Disposition of cord blood: No    Blood gases sent? No Complications: None  Placenta: Delivered: 04/09/2015 5:26 PM  Spontaneous  Intact appearance Newborn Measurements: Weight: 5 lb 5.2 oz (2415 g)  Height: 18.5"  Head circumference:    Chest circumference:    Other providers: Delivery Nurse Vergie LivingJillian C Maricle  Additional  information: Forceps:   Vacuum:   Breech:   Observed anomalies      Information for the patient's newborn:  Hebert SohoReams, GirlB Humaira [[id number]][[id number]]    Delivery 04/09/2015 5:21 PM by  Vaginal, Spontaneous Delivery Sex:  female Gestational Age: 3656w2d Delivery Clinician:  Hildred LaserAnika Zakhi [place] Living?: Yes        APGARS  One minute Five minutes Ten minutes  Skin color: 1   1      Heart rate: 2   2      Grimace: 2   2      Muscle tone: 2   2      Breathing: 2   2      Totals: 9  9      Presentation/position: Vertex     Resuscitation: None  Cord information: 3 vessels   Disposition of cord blood:     Blood gases sent?  Complications:   Placenta: Delivered: 04/09/2015 5:26 PM  Spontaneous  Intact appearance Newborn Measurements: Weight: 6 lb 7.4 oz  (2930 g)  Height: 19.49"  Head circumference:    Chest circumference:    Other providers: Delivery Nurse Transition RN Vergie LivingJillian C Maricle   Additional  information: Forceps:   Vacuum:   Breech:   Observed anomalies          Karlyne GreenspanReams, GirlA Phaedra [[id number]][[id number]]  Delivery Note At 5:13 PM a viable and healthy female was delivered via Vaginal, Spontaneous Delivery (Presentation: Vertex; LOA position).  APGAR: 9, 9; weight 5 lb 5.2 oz (2415 g).   Placenta status: Intact, Spontaneous.  Cord: 3 vessels with the following complications: None.  Cord pH: not obtained  Anesthesia: None  Episiotomy: None Lacerations: None Suture Repair: None Est. Blood Loss (mL):  250 ml (for total delivery)  Mom to postpartum.  Baby to Couplet care / Skin to Skin.  Hildred Lasernika Ezequiel Macauley 04/09/2015, 7:02 PM     Hebert SohoReams, GirlB Adithi [[id number]][[id number]]  Delivery Note At 5:21 PM  a viable and healthy female was delivered via Vaginal, Spontaneous Delivery (Presentation: Vertex; LOA position).  APGAR: 9, 9; weight 6 lb 7.4 oz (2930 g).   Placenta status: Intact, Spontaneous.  Cord: 3 vessels with the following complications: .  Cord pH: Not obtained  Anesthesia: None  Episiotomy: None Lacerations: None Suture Repair: None Est. Blood Loss (mL):  250 ml (for total delivery)  Mom to postpartum.  Baby to Couplet care / Skin to Skin.  Hildred Laser 04/09/2015, 7:02 PM

## 2015-01-12 ENCOUNTER — Ambulatory Visit (INDEPENDENT_AMBULATORY_CARE_PROVIDER_SITE_OTHER): Payer: Medicaid Other | Admitting: Obstetrics and Gynecology

## 2015-01-12 VITALS — BP 100/64 | HR 121 | Wt 158.5 lb

## 2015-01-12 DIAGNOSIS — O30032 Twin pregnancy, monochorionic/diamniotic, second trimester: Secondary | ICD-10-CM

## 2015-01-12 DIAGNOSIS — R000 Tachycardia, unspecified: Secondary | ICD-10-CM | POA: Insufficient documentation

## 2015-01-12 LAB — POCT URINALYSIS DIPSTICK
Bilirubin, UA: NEGATIVE
Glucose, UA: NEGATIVE
Ketones, UA: NEGATIVE
LEUKOCYTES UA: NEGATIVE
NITRITE UA: NEGATIVE
PH UA: 6
RBC UA: NEGATIVE
Spec Grav, UA: 1.025
Urobilinogen, UA: NEGATIVE

## 2015-01-12 MED ORDER — FOLIC ACID 1 MG PO TABS: 1.0000 mg | ORAL_TABLET | Freq: Every day | ORAL | Status: DC

## 2015-01-12 MED ORDER — ASPIRIN EC 81 MG PO TBEC: 81.0000 mg | DELAYED_RELEASE_TABLET | Freq: Every day | ORAL | Status: DC

## 2015-01-12 NOTE — Progress Notes (Signed)
ROB: Patient notes episodes of increased fatigue, and then alternatively notes days of increased energy.  Denies any associated symptoms.  Answered questions regarding serial growth ultrasounds, and NSTs for pregnancy to start at 24 weeks. Discussed delivery plan of 34-[redacted] weeks gestation. Discussed delivery of twins based on fetal presentation.   [facility] and possibly co-management based on ultrasound and NST findings. Reiterated use of daily low dose aspirin. Also discussed weight monitoring (gained 18 lbs in 4 weeks). RTC in 4 weeks, for glucola, Tdap, and blood consents at that time.

## 2015-01-13 ENCOUNTER — Other Ambulatory Visit: Payer: Self-pay | Admitting: Obstetrics and Gynecology

## 2015-01-13 ENCOUNTER — Ambulatory Visit (INDEPENDENT_AMBULATORY_CARE_PROVIDER_SITE_OTHER): Payer: Medicaid Other

## 2015-01-13 DIAGNOSIS — O30032 Twin pregnancy, monochorionic/diamniotic, second trimester: Secondary | ICD-10-CM

## 2015-01-13 DIAGNOSIS — Z1379 Encounter for other screening for genetic and chromosomal anomalies: Secondary | ICD-10-CM

## 2015-01-13 DIAGNOSIS — Z3491 Encounter for supervision of normal pregnancy, unspecified, first trimester: Secondary | ICD-10-CM

## 2015-01-19 ENCOUNTER — Telehealth: Payer: Self-pay | Admitting: Obstetrics and Gynecology

## 2015-01-19 NOTE — Telephone Encounter (Signed)
Called pt she states that there was an rx at the pharmacy for her for Folic Acid and she wasn't sure what this was or why she needed to take it, also she questions if she needs this plus baby aspirin. Explained to pt that she needs the extra folic acid because she is a twin pregnancy and that she does need to take the baby aspirin also as these 2 medications are for 2 different indications. Pt gave verbal understanding.

## 2015-01-19 NOTE — Telephone Encounter (Signed)
PT CALLED AND DR CHERRY TOLD HER TO GO AND PICK UP BABY ASPRIN BUT SHE JUST GOT AROUND TO DOING THAT DUE TO THE SNOW BUT WHEN SHE WENT THERE IT WAS FOLIC ACID THAT WAS CALLED IN, SO DOES SHE NEED TO GET THE ASPRIN TOO OR JUST TAKE THE FOLIC ACID?

## 2015-01-21 ENCOUNTER — Inpatient Hospital Stay
Admission: RE | Admit: 2015-01-21 | Discharge: 2015-01-21 | Disposition: A | Discharge status: Home / Self Care | Payer: No Typology Code available for payment source | Attending: Obstetrics and Gynecology | Admitting: Obstetrics and Gynecology

## 2015-01-21 DIAGNOSIS — Z029 Encounter for administrative examinations, unspecified: Secondary | ICD-10-CM | POA: Insufficient documentation

## 2015-02-01 ENCOUNTER — Ambulatory Visit: Payer: No Typology Code available for payment source

## 2015-02-04 ENCOUNTER — Observation Stay
Admission: RE | Admit: 2015-02-04 | Discharge: 2015-02-04 | Disposition: A | Discharge status: Home / Self Care | Payer: Medicaid Other | Attending: Obstetrics and Gynecology | Admitting: Obstetrics and Gynecology

## 2015-02-04 ENCOUNTER — Encounter: Payer: Self-pay | Admitting: *Deleted

## 2015-02-04 DIAGNOSIS — Z3A27 27 weeks gestation of pregnancy: Secondary | ICD-10-CM | POA: Diagnosis not present

## 2015-02-04 DIAGNOSIS — O30032 Twin pregnancy, monochorionic/diamniotic, second trimester: Principal | ICD-10-CM | POA: Insufficient documentation

## 2015-02-04 DIAGNOSIS — O30033 Twin pregnancy, monochorionic/diamniotic, third trimester: Secondary | ICD-10-CM | POA: Diagnosis present

## 2015-02-04 NOTE — OB Triage Note (Addendum)
Pt. Here for scheduled bi-weekly NST; high risk mono, di di twins.

## 2015-02-04 NOTE — Progress Notes (Deleted)
Erroneous Note

## 2015-02-04 NOTE — Progress Notes (Addendum)
MD notified of increased pulse (120's-130's), pt. With no c/o of chest pains, SOB, Vaginal bleeding or discomfort. Pt. Is not sure why she has an increase pulse.  MD reviewed fetal tracing, reactive, pt. Has no complaints. Twin A has baseline rate in 150 bpm, Twin B has a baseline of 125 bpm Twin A & Twin B mod. varibility with 10X10 and 15X15 accel.; Uterine irritability noted.  Pt. Discharged to home.

## 2015-02-06 NOTE — Final Progress Note (Signed)
L&D OB Triage Note  Colleen Lang is a 20 y.o. G32P1001 female at [redacted]w[redacted]d, EDD Estimated Date of Delivery: 05/05/15 who presented to triage for scheduled NST for monochorionic diamniotic twin gestation. Vital signs stable. An NST was performed and has been reviewed by MD.  NST INTERPRETATION: Indications: multiple gestation  Mode: External Baseline Rate (A): 150 bpm. Baseline Rate (B): 145 bpm. Variability: Moderate (x 2) Accelerations: 15 x 15 (x 2)       Contraction Frequency (min): irritability   Impression: reactive   Plan: NST performed was reviewed and was found to be reactive. She was discharged home   Continue routine prenatal care. Follow up with OB/GYN as previously scheduled.  Continue twice weekly NSTs.     Hildred Laser, MD

## 2015-02-08 ENCOUNTER — Observation Stay
Admission: RE | Admit: 2015-02-08 | Discharge: 2015-02-08 | Disposition: A | Discharge status: Home / Self Care | Payer: Medicaid Other | Attending: Obstetrics and Gynecology | Admitting: Obstetrics and Gynecology

## 2015-02-08 ENCOUNTER — Encounter: Payer: Self-pay | Admitting: *Deleted

## 2015-02-08 DIAGNOSIS — O30032 Twin pregnancy, monochorionic/diamniotic, second trimester: Principal | ICD-10-CM | POA: Insufficient documentation

## 2015-02-08 DIAGNOSIS — Z3A27 27 weeks gestation of pregnancy: Secondary | ICD-10-CM | POA: Diagnosis not present

## 2015-02-08 NOTE — OB Triage Note (Signed)
Weekly NST. Elaina Hoops

## 2015-02-09 ENCOUNTER — Ambulatory Visit (INDEPENDENT_AMBULATORY_CARE_PROVIDER_SITE_OTHER): Payer: Medicaid Other | Admitting: Obstetrics and Gynecology

## 2015-02-09 ENCOUNTER — Other Ambulatory Visit: Payer: Self-pay | Admitting: Obstetrics and Gynecology

## 2015-02-09 VITALS — BP 96/61 | HR 100 | Wt 169.6 lb

## 2015-02-09 DIAGNOSIS — O30032 Twin pregnancy, monochorionic/diamniotic, second trimester: Secondary | ICD-10-CM

## 2015-02-09 DIAGNOSIS — Z23 Encounter for immunization: Secondary | ICD-10-CM | POA: Diagnosis not present

## 2015-02-09 LAB — POCT URINALYSIS DIPSTICK
BILIRUBIN UA: NEGATIVE
Blood, UA: NEGATIVE
Glucose, UA: NEGATIVE
KETONES UA: NEGATIVE
NITRITE UA: NEGATIVE
PH UA: 6
Spec Grav, UA: >=1.03
Urobilinogen, UA: NEGATIVE

## 2015-02-09 MED ORDER — CONCEPT DHA 53.5-38-1 MG PO CAPS: 1.0000 | ORAL_CAPSULE | Freq: Every day | ORAL | Status: DC

## 2015-02-09 NOTE — Final Progress Note (Signed)
L&D OB Triage Note  MAUD RUBENDALL is a 20 y.o. G79P1001 female at [redacted]w[redacted]d, EDD Estimated Date of Delivery: 05/05/15 who presented to triage for scheduled NST for monochorionic diamniotic twin gestation. Vital signs stable except for noted tachycardia (120s).  Patient asymptomatic. An NST was performed and has been reviewed by MD.  NST INTERPRETATION: Indications: multiple gestation  Mode: External Baseline Rate (A): 135 bpm. Baseline Rate (B): 140 bpm. Variability: Moderate (x 2) Accelerations: 15 x 15 (x 2)     Contraction Frequency (min): irritability   Impression: reactive   Plan: NST performed was reviewed and was found to be reactive. She was discharged home   Continue routine prenatal care. Follow up with OB/GYN as previously scheduled. If tachycardia is persistent, will have patient f/u outpatient with Cardiology. Continue twice weekly NSTs.     Hildred Laser, MD

## 2015-02-09 NOTE — Progress Notes (Signed)
ROB: Patient notes occasional palpitations.  Notes son recently admitted to hospital for asthma/pneumonia.  Denies any URI symptoms currently. Missed [facility] appt due to her son's hospitalization.  Rescheduled for this Thursday.  For growth scan in 1 week.  for glucola, Tdap, and blood consents. RTC in 2 weeks. If tachycardia/palpitations persist, may benefit from referral to Cardiology.  Continue twice weekly NSTs on L&D.

## 2015-02-10 ENCOUNTER — Other Ambulatory Visit: Payer: Self-pay | Admitting: Obstetrics and Gynecology

## 2015-02-10 DIAGNOSIS — O99019 Anemia complicating pregnancy, unspecified trimester: Secondary | ICD-10-CM | POA: Insufficient documentation

## 2015-02-10 LAB — HEMOGLOBIN AND HEMATOCRIT, BLOOD
Hematocrit: 29.6 % — ABNORMAL LOW (ref 34.0–46.6)
Hemoglobin: 10 g/dL — ABNORMAL LOW (ref 11.1–15.9)

## 2015-02-10 LAB — GLUCOSE, 1 HOUR GESTATIONAL: GESTATIONAL DIABETES SCREEN: 91 mg/dL (ref 65–139)

## 2015-02-10 MED ORDER — FUSION PLUS PO CAPS: 1.0000 | ORAL_CAPSULE | Freq: Every day | ORAL | Status: DC

## 2015-02-11 ENCOUNTER — Ambulatory Visit
Admission: RE | Admit: 2015-02-11 | Discharge: 2015-02-11 | Disposition: A | Discharge status: Home / Self Care | Payer: Medicaid Other | Source: Ambulatory Visit | Attending: Maternal & Fetal Medicine | Admitting: Maternal & Fetal Medicine

## 2015-02-11 ENCOUNTER — Ambulatory Visit: Payer: Medicaid Other

## 2015-02-11 ENCOUNTER — Telehealth: Payer: Self-pay | Admitting: *Deleted

## 2015-02-11 VITALS — BP 116/66 | HR 94 | Wt 170.6 lb

## 2015-02-11 DIAGNOSIS — Z833 Family history of diabetes mellitus: Secondary | ICD-10-CM | POA: Insufficient documentation

## 2015-02-11 DIAGNOSIS — Z87891 Personal history of nicotine dependence: Secondary | ICD-10-CM | POA: Diagnosis not present

## 2015-02-11 DIAGNOSIS — O99019 Anemia complicating pregnancy, unspecified trimester: Secondary | ICD-10-CM | POA: Diagnosis not present

## 2015-02-11 DIAGNOSIS — R000 Tachycardia, unspecified: Secondary | ICD-10-CM | POA: Insufficient documentation

## 2015-02-11 DIAGNOSIS — O30003 Twin pregnancy, unspecified number of placenta and unspecified number of amniotic sacs, third trimester: Secondary | ICD-10-CM

## 2015-02-11 DIAGNOSIS — O30033 Twin pregnancy, monochorionic/diamniotic, third trimester: Secondary | ICD-10-CM | POA: Insufficient documentation

## 2015-02-11 DIAGNOSIS — Z0189 Encounter for other specified special examinations: Secondary | ICD-10-CM | POA: Insufficient documentation

## 2015-02-11 DIAGNOSIS — Z3A28 28 weeks gestation of pregnancy: Secondary | ICD-10-CM | POA: Diagnosis not present

## 2015-02-11 DIAGNOSIS — O09893 Supervision of other high risk pregnancies, third trimester: Secondary | ICD-10-CM | POA: Diagnosis not present

## 2015-02-11 LAB — TSH: TSH: 3.118 u[IU]/mL (ref 0.350–4.500)

## 2015-02-11 MED ORDER — FERROUS SULFATE 325 (65 FE) MG PO TBEC: 325.0000 mg | DELAYED_RELEASE_TABLET | Freq: Three times a day (TID) | ORAL | Status: DC

## 2015-02-11 NOTE — Telephone Encounter (Signed)
Notified pt, she states [facility] had called over to our office and changed medication

## 2015-02-11 NOTE — Progress Notes (Addendum)
[facility] Consultation   Chief Complaint: monochorionic diamniotic twin gestation  HPI: [name] is a 20 y.o. G2P1001 at [redacted]w[redacted]d by [redacted]w[redacted]d ultrasound performed at [facility] who presents in consultation from  Dr. Valentino Lang for monochorionic diamniotic twin gestation  This pregnancy the patient had normal first trimester screening on 10/28/2014.  She had normal quad screening on 11/27/2014.  Her last ultrasound for growth was 01/13/2015.  The patient is currently complaining of light-headedness accompanied by a high pulse rate.    Obstetric History:  Obstetric History   G2   P1   T1   P0   A0   TAB0   SAB0   E0   M0   L1     # Outcome Date GA Lbr Len/2nd Weight Sex Delivery Anes PTL Lv  2 Current           1 Term 2014   9 lb 1.6 oz (4.128 kg) M Vag-Spont   Y      Gynecologic History:   Abnormal uterine bleeding, pelvic pain, ovarian cyst, history of chlamydia infection 2014.  Past Medical History: Patient  has a past medical history of Abnormal uterine bleeding (AUB); Pelvic pain in female; AR (allergic rhinitis); Ovarian cyst; Breast feeding status of mother; and History of chlamydia infection (2014).   Past Surgical History: She  has past surgical history that includes Knee surgery; Wisdom tooth extraction; and Tonsillectomy.   Medications: PN vitamins.     Allergies: Patient is allergic to ultram and other.   Social History: Patient  reports that she has quit smoking. Her smoking use included Cigarettes. She smoked 0.25 packs per day. She does not have any smokeless tobacco history on file. She reports that she does not drink alcohol or use illicit drugs.   Family History: family history includes Diabetes in her paternal grandfather and paternal grandmother. There is no history of Cancer, Ovarian cancer, Breast cancer, or Heart disease.   Review of Systems A full 12 point review of systems was negative or as noted in the History of Present  Illness.  Physical Exam: BP 116/66 mmHg  Pulse 94  Wt 170 lb 9.6 oz (77.384 kg)  SpO2 98%  LMP  (LMP Unknown)   [place] today: Twin A:  BREECH, EFW  1128 g  30th %tile, MVP - 4.1cm.   Normal anatomy or visualized previously.  No previa.  No evidence of hydrops. Twin B:  Transverse, EFW 1220  g  47th %tile, MVP - 4.9  Normal anatomy or visualized previously.  No previa. No evidence of hydrops.   No evidence of TTTS  02/09/2015 - Glucose screen -91; Hb 10.0, Hct 29.6  Asessement: 20 yo gravida 2 para 1001 at [redacted]w[redacted]d with: 1. Monodi twins . Maternal tachycardia in setting of anemia with Hb of 10  Recommendations: 1. Monodi twins -Discussed risks of pregnancy as follows: -TTTS  which is less likely to develop the further the pregnancy progresses. -Increased risk of preeclampsia and GHTN as well as PTD with twin gestation -9 fold increased risk of congenital heart anomalies in [facility] twins -Discussed management plan as follows: -Hydrops check q2 weeks for TTTS -Growth q4 weeks -NST/AFI at 32 weeks, at least once weekly (she is currently undergoing twice weekly testing at [facility]) -Fetal echo  -Delivery 37+ weeks -As she has had one prior SVD, she would be a good candidate for VD and breech extraction as necessary. Can discuss further as pregnancy continues or with her  providers. 2. Maternal tachycardia in setting of anemia with Hb of 10 - TSH today - EKG  - FeSO4 325 mg up to tid as tolerated  Total time spent with the patient was 40 minutes with greater than 50% spent in counseling and coordination of care.  We appreciate this inconsult and will be happy to be involved in the ongoing care of Colleen Lang in anyway her obstetricians desire.  Argentina Ponder, MD [facility]

## 2015-02-11 NOTE — Telephone Encounter (Signed)
-----   Message from [place], [place] sent at 02/10/2015  7:11 PM EST ----- Please let her know she passed her gluola but is anemic- I sent in Rx for fusion plus, I want her to take one daily Monday thru Friday each week until delivery

## 2015-02-11 NOTE — Addendum Note (Signed)
Encounter addended by: Lady Deutscher, MD on: 02/11/2015  1:05 PM<BR>     Documentation filed: Notes Section

## 2015-02-12 ENCOUNTER — Ambulatory Visit
Admission: RE | Admit: 2015-02-12 | Discharge: 2015-02-12 | Disposition: A | Discharge status: Home / Self Care | Payer: Medicaid Other | Source: Ambulatory Visit | Attending: Nurse Practitioner | Admitting: Nurse Practitioner

## 2015-02-12 ENCOUNTER — Other Ambulatory Visit: Payer: Self-pay | Admitting: Obstetrics and Gynecology

## 2015-02-12 DIAGNOSIS — Z029 Encounter for administrative examinations, unspecified: Secondary | ICD-10-CM | POA: Insufficient documentation

## 2015-02-12 DIAGNOSIS — Z0374 Encounter for suspected problem with fetal growth ruled out: Secondary | ICD-10-CM

## 2015-02-17 ENCOUNTER — Ambulatory Visit (INDEPENDENT_AMBULATORY_CARE_PROVIDER_SITE_OTHER): Payer: Medicaid Other

## 2015-02-17 DIAGNOSIS — O30032 Twin pregnancy, monochorionic/diamniotic, second trimester: Secondary | ICD-10-CM | POA: Diagnosis not present

## 2015-02-17 DIAGNOSIS — Z0374 Encounter for suspected problem with fetal growth ruled out: Secondary | ICD-10-CM

## 2015-02-19 DIAGNOSIS — Z3493 Encounter for supervision of normal pregnancy, unspecified, third trimester: Secondary | ICD-10-CM | POA: Insufficient documentation

## 2015-02-22 ENCOUNTER — Other Ambulatory Visit: Payer: Self-pay

## 2015-02-22 DIAGNOSIS — O30033 Twin pregnancy, monochorionic/diamniotic, third trimester: Secondary | ICD-10-CM

## 2015-02-24 ENCOUNTER — Ambulatory Visit (INDEPENDENT_AMBULATORY_CARE_PROVIDER_SITE_OTHER): Payer: Medicaid Other | Admitting: Obstetrics and Gynecology

## 2015-02-24 ENCOUNTER — Telehealth: Payer: Self-pay | Admitting: Obstetrics and Gynecology

## 2015-02-24 VITALS — BP 91/65 | HR 105 | Wt 171.4 lb

## 2015-02-24 DIAGNOSIS — O30033 Twin pregnancy, monochorionic/diamniotic, third trimester: Secondary | ICD-10-CM

## 2015-02-24 DIAGNOSIS — O2653 Maternal hypotension syndrome, third trimester: Secondary | ICD-10-CM

## 2015-02-24 DIAGNOSIS — O99019 Anemia complicating pregnancy, unspecified trimester: Secondary | ICD-10-CM

## 2015-02-24 LAB — POCT URINALYSIS DIPSTICK
Bilirubin, UA: NEGATIVE
GLUCOSE UA: NEGATIVE
Ketones, UA: NEGATIVE
LEUKOCYTES UA: NEGATIVE
NITRITE UA: NEGATIVE
Protein, UA: 30
RBC UA: NEGATIVE
Spec Grav, UA: >=1.03
UROBILINOGEN UA: NEGATIVE
pH, UA: 6.5

## 2015-02-24 MED ORDER — PANTOPRAZOLE SODIUM 20 MG PO TBEC: 20.0000 mg | DELAYED_RELEASE_TABLET | Freq: Two times a day (BID) | ORAL | Status: DC

## 2015-02-24 NOTE — Telephone Encounter (Signed)
Patient called stating she is started spotting while she was shopping at [employer]. She is also having period like cramps and braxton hicks contractions on and off. Please Advise.

## 2015-02-24 NOTE — Progress Notes (Signed)
ROB: Patient notes lower pelvic pain and pressure, as well as back pain.  Advised on Tylenol, warm baths, and pregnancy girdle.  Is s/p visit with [facility] who recommend holding on NSTs until 32 weeks (can perform once or twice weekly).  Also recommend weekly AFI's, growth scans q 4 weeks, and hydrops scan q [redacted] weeks along with fetal echo.  Also ordered EKG and TSH for persistent tachycardia.  TSH wnl, EKG with sinus tachycardia.  Of note, patient does report h/o anxiety disorder, and states that she does feel like she may be having mild panic attacks.  Discussed behavioral management.  Has been seen by a therapist in the past.  Does not want to initiate medications due to needing to wean shortly after initiation for delivery.  Also discussed delivery plans again, with anticipated SVD unless breech presentation of Twin A.  Will continue to plan for delivery between 34-36 weeks.  Will likely administer antenatal steroids at ~32-34 weeks in case of earlier delivery. Last growth scan last week wnl.

## 2015-02-25 ENCOUNTER — Ambulatory Visit
Admission: RE | Admit: 2015-02-25 | Discharge: 2015-02-25 | Disposition: A | Discharge status: Home / Self Care | Payer: Medicaid Other | Source: Ambulatory Visit | Attending: Maternal & Fetal Medicine | Admitting: Maternal & Fetal Medicine

## 2015-02-25 VITALS — BP 117/62 | HR 107 | Wt 173.2 lb

## 2015-02-25 DIAGNOSIS — O30033 Twin pregnancy, monochorionic/diamniotic, third trimester: Secondary | ICD-10-CM | POA: Insufficient documentation

## 2015-02-25 DIAGNOSIS — O99019 Anemia complicating pregnancy, unspecified trimester: Secondary | ICD-10-CM

## 2015-02-25 DIAGNOSIS — Z3A30 30 weeks gestation of pregnancy: Secondary | ICD-10-CM | POA: Diagnosis not present

## 2015-02-25 NOTE — Telephone Encounter (Signed)
Pt calls and states she had 1 episode of spotting after being her on feet at [employer]. Pt states that the spotting was scant in her panties and she noticed it more so when she wiped. After speaking with Dr.cherry, I advised pt that this level of spotting was acceptable. Advised pt to seek care here or at the ER if bleeding became heavy period like bleeding, if she began leaking fluid, or experienced contractions. Pt gave verbal understanding.

## 2015-03-01 ENCOUNTER — Encounter: Payer: Self-pay | Admitting: Nurse Practitioner

## 2015-03-03 ENCOUNTER — Telehealth: Payer: Self-pay | Admitting: Obstetrics and Gynecology

## 2015-03-03 NOTE — Telephone Encounter (Signed)
Contacted patient who notes that she is no longer vomiting, however still feeling pain in her back that radiates to her abdomen and pelvis.  Denies contractions.  Notes that nothing has helped with the pain except warm baths.  Has taken 4 baths today.  Has not tried any medications.  Advised on either ES Tylenol, or can take a T#3 that she uses for migraines.  If pain still significant after these interventions, recommend f/u on L&D for further assessment.

## 2015-03-03 NOTE — Telephone Encounter (Signed)
Please advise 

## 2015-03-03 NOTE — Telephone Encounter (Signed)
Colleen Lang went out to eat last night, she starting throwing up at 230 am till 730 am, very nauseated, heart rate shooting up and chest is tight. Pain in your back stomach and legs. constant pain

## 2015-03-11 ENCOUNTER — Other Ambulatory Visit: Payer: Medicaid Other

## 2015-03-11 ENCOUNTER — Encounter: Payer: Self-pay | Admitting: Obstetrics and Gynecology

## 2015-03-11 ENCOUNTER — Ambulatory Visit (INDEPENDENT_AMBULATORY_CARE_PROVIDER_SITE_OTHER): Payer: Medicaid Other | Admitting: Obstetrics and Gynecology

## 2015-03-11 ENCOUNTER — Ambulatory Visit
Admission: RE | Admit: 2015-03-11 | Discharge: 2015-03-11 | Disposition: A | Discharge status: Home / Self Care | Payer: Medicaid Other | Source: Ambulatory Visit | Attending: Obstetrics and Gynecology | Admitting: Obstetrics and Gynecology

## 2015-03-11 ENCOUNTER — Other Ambulatory Visit: Payer: Self-pay

## 2015-03-11 VITALS — BP 107/66 | HR 91 | Wt 180.2 lb

## 2015-03-11 VITALS — BP 110/65 | HR 91 | Temp 98.4°F | Resp 18 | Wt 181.6 lb

## 2015-03-11 DIAGNOSIS — O30033 Twin pregnancy, monochorionic/diamniotic, third trimester: Secondary | ICD-10-CM

## 2015-03-11 DIAGNOSIS — O30003 Twin pregnancy, unspecified number of placenta and unspecified number of amniotic sacs, third trimester: Secondary | ICD-10-CM

## 2015-03-11 DIAGNOSIS — O1203 Gestational edema, third trimester: Secondary | ICD-10-CM

## 2015-03-11 DIAGNOSIS — Z3A32 32 weeks gestation of pregnancy: Secondary | ICD-10-CM | POA: Diagnosis not present

## 2015-03-11 DIAGNOSIS — O99019 Anemia complicating pregnancy, unspecified trimester: Secondary | ICD-10-CM

## 2015-03-11 DIAGNOSIS — O30032 Twin pregnancy, monochorionic/diamniotic, second trimester: Secondary | ICD-10-CM

## 2015-03-11 LAB — POCT URINALYSIS DIPSTICK
Bilirubin, UA: NEGATIVE
Glucose, UA: NEGATIVE
Ketones, UA: NEGATIVE
Leukocytes, UA: NEGATIVE
Nitrite, UA: NEGATIVE
PH UA: 6.5
SPEC GRAV UA: 1.02
UROBILINOGEN UA: NEGATIVE

## 2015-03-11 NOTE — Patient Instructions (Signed)
Contraception Choices Contraception (birth control) is the use of any methods or devices to prevent pregnancy. Below are some methods to help avoid pregnancy. HORMONAL METHODS   Contraceptive implant. This is a thin, plastic tube containing progesterone hormone. It does not contain estrogen hormone. Your health care provider inserts the tube in the inner part of the upper arm. The tube can remain in place for up to 3 years. After 3 years, the implant must be removed. The implant prevents the ovaries from releasing an egg (ovulation), thickens the cervical mucus to prevent sperm from entering the uterus, and thins the lining of the inside of the uterus.  Progesterone-only injections. These injections are given every 3 months by your health care provider to prevent pregnancy. This synthetic progesterone hormone stops the ovaries from releasing eggs. It also thickens cervical mucus and changes the uterine lining. This makes it harder for sperm to survive in the uterus.  Birth control pills. These pills contain estrogen and progesterone hormone. They work by preventing the ovaries from releasing eggs (ovulation). They also cause the cervical mucus to thicken, preventing the sperm from entering the uterus. Birth control pills are prescribed by a health care provider.Birth control pills can also be used to treat heavy periods.  Minipill. This type of birth control pill contains only the progesterone hormone. They are taken every day of each month and must be prescribed by your health care provider.  Birth control patch. The patch contains hormones similar to those in birth control pills. It must be changed once a week and is prescribed by a health care provider.  Vaginal ring. The ring contains hormones similar to those in birth control pills. It is left in the vagina for 3 weeks, removed for 1 week, and then a new one is put back in place. The patient must be comfortable inserting and removing the ring  from the vagina.A health care provider's prescription is necessary.  Emergency contraception. Emergency contraceptives prevent pregnancy after unprotected sexual intercourse. This pill can be taken right after sex or up to 5 days after unprotected sex. It is most effective the sooner you take the pills after having sexual intercourse. Most emergency contraceptive pills are available without a prescription. Check with your pharmacist. Do not use emergency contraception as your only form of birth control. BARRIER METHODS   Female condom. This is a thin sheath (latex or rubber) that is worn over the penis during sexual intercourse. It can be used with spermicide to increase effectiveness.  Female condom. This is a soft, loose-fitting sheath that is put into the vagina before sexual intercourse.  Diaphragm. This is a soft, latex, dome-shaped barrier that must be fitted by a health care provider. It is inserted into the vagina, along with a spermicidal jelly. It is inserted before intercourse. The diaphragm should be left in the vagina for 6 to 8 hours after intercourse.  Cervical cap. This is a round, soft, latex or plastic cup that fits over the cervix and must be fitted by a health care provider. The cap can be left in place for up to 48 hours after intercourse.  Sponge. This is a soft, circular piece of polyurethane foam. The sponge has spermicide in it. It is inserted into the vagina after wetting it and before sexual intercourse.  Spermicides. These are chemicals that kill or block sperm from entering the cervix and uterus. They come in the form of creams, jellies, suppositories, foam, or tablets. They do not require a   prescription. They are inserted into the vagina with an applicator before having sexual intercourse. The process must be repeated every time you have sexual intercourse. INTRAUTERINE CONTRACEPTION  Intrauterine device (IUD). This is a T-shaped device that is put in a woman's uterus  during a menstrual period to prevent pregnancy. There are 2 types:  Copper IUD. This type of IUD is wrapped in copper wire and is placed inside the uterus. Copper makes the uterus and fallopian tubes produce a fluid that kills sperm. It can stay in place for 10 years.  Hormone IUD. This type of IUD contains the hormone progestin (synthetic progesterone). The hormone thickens the cervical mucus and prevents sperm from entering the uterus, and it also thins the uterine lining to prevent implantation of a fertilized egg. The hormone can weaken or kill the sperm that get into the uterus. It can stay in place for 3-5 years, depending on which type of IUD is used. PERMANENT METHODS OF CONTRACEPTION  Female tubal ligation. This is when the woman's fallopian tubes are surgically sealed, tied, or blocked to prevent the egg from traveling to the uterus.  Hysteroscopic sterilization. This involves placing a small coil or insert into each fallopian tube. Your doctor uses a technique called hysteroscopy to do the procedure. The device causes scar tissue to form. This results in permanent blockage of the fallopian tubes, so the sperm cannot fertilize the egg. It takes about 3 months after the procedure for the tubes to become blocked. You must use another form of birth control for these 3 months.  Female sterilization. This is when the female has the tubes that carry sperm tied off (vasectomy).This blocks sperm from entering the vagina during sexual intercourse. After the procedure, the man can still ejaculate fluid (semen). NATURAL PLANNING METHODS  Natural family planning. This is not having sexual intercourse or using a barrier method (condom, diaphragm, cervical cap) on days the woman could become pregnant.  Calendar method. This is keeping track of the length of each menstrual cycle and identifying when you are fertile.  Ovulation method. This is avoiding sexual intercourse during ovulation.  Symptothermal  method. This is avoiding sexual intercourse during ovulation, using a thermometer and ovulation symptoms.  Post-ovulation method. This is timing sexual intercourse after you have ovulated. Regardless of which type or method of contraception you choose, it is important that you use condoms to protect against the transmission of sexually transmitted infections (STIs). Talk with your health care provider about which form of contraception is most appropriate for you.   This information is not intended to replace advice given to you by your health care provider. Make sure you discuss any questions you have with your health care provider.   Document Released: 12/26/2004 Document Revised: 12/31/2012 Document Reviewed: 06/20/2012 Elsevier Interactive Patient Education 2016 Elsevier Inc.  

## 2015-03-11 NOTE — Progress Notes (Signed)
ROB: Patient complains of swelling in feet bilaterally, painful.  Advised on elevating legs, compression stockings. Nonpitting edema noted on exam. Will begin twice weekly NSTs starting tomorrow (was supposed to perform today after visit, however has growth scan with [facility] following this appointment). Still planning for delivery between 34-36 weeks.  Will administer antenatal steroids at next visit. To perform 3rd trimester labs next visit. Desires to breastfeed. Unsure of contraceptive desires.

## 2015-03-12 ENCOUNTER — Inpatient Hospital Stay
Admission: RE | Admit: 2015-03-12 | Discharge: 2015-03-12 | Disposition: A | Discharge status: Home / Self Care | Payer: Medicaid Other | Attending: Obstetrics and Gynecology | Admitting: Obstetrics and Gynecology

## 2015-03-12 DIAGNOSIS — Z3A34 34 weeks gestation of pregnancy: Secondary | ICD-10-CM | POA: Diagnosis not present

## 2015-03-12 DIAGNOSIS — Z3493 Encounter for supervision of normal pregnancy, unspecified, third trimester: Secondary | ICD-10-CM | POA: Diagnosis not present

## 2015-03-12 DIAGNOSIS — O99019 Anemia complicating pregnancy, unspecified trimester: Secondary | ICD-10-CM

## 2015-03-12 NOTE — Discharge Summary (Signed)
Discharge instructions reviewed with patient including need to schedule routine NST at [facility], fetal kick counts, and when to seek medical attention. All questions answered. Patient discharged home in stable condition, ambulatory with steady gait.

## 2015-03-17 ENCOUNTER — Telehealth: Payer: Self-pay | Admitting: Obstetrics and Gynecology

## 2015-03-17 NOTE — Telephone Encounter (Signed)
Called pt she states that she is having spotting, initially was bright red, then pink, then a brownish color and has now become bright red again. Pt denies contractions, notes that she is a little more crampy today than usual. Bleeding has been occuring since 3am. Of note pt mentions that she had intercourse last night. Pt states that she has changed her panty liner 6 times today. Notes that this has happened before but did not last as long, this concerns pt. Advised pt that bleeding after intercourse was normal. Pt wants reassurance from provider. Please advise.

## 2015-03-17 NOTE — Telephone Encounter (Signed)
Called pt informed her of the information below.  

## 2015-03-17 NOTE — Telephone Encounter (Signed)
Patient called stating she is 33 weeks with twins and bleeding. She wanted to know if it is normal.Please Advise

## 2015-03-17 NOTE — Telephone Encounter (Signed)
Yes, please advise that it is normal after intercourse. The further along she is, the mores vascular her cervix becomes (increased blood flow).  She also may have cramping and pressure.  Can last up to 24 hours. Please advise patient that if the bleeding becomes heavier (more than just spotting), or the cramping intensifies, she should call back to the office, or go to L&D for further evaluation.

## 2015-03-18 ENCOUNTER — Telehealth: Payer: Self-pay | Admitting: Obstetrics and Gynecology

## 2015-03-18 NOTE — Telephone Encounter (Signed)
Please advise. Told pt yesterday that bleeding could last for 24hrs, pt did not mention chest pain yesterday.

## 2015-03-18 NOTE — Telephone Encounter (Signed)
Colleen Lang was told to call back today if she was still bleeding. She woke up light headed and feeling like something was sitting on her chest. She is still bleeding but it has slowed down. Cramping more today. She called at 1:10  Please call her back.

## 2015-03-19 ENCOUNTER — Ambulatory Visit (INDEPENDENT_AMBULATORY_CARE_PROVIDER_SITE_OTHER): Payer: Medicaid Other | Admitting: Obstetrics and Gynecology

## 2015-03-19 VITALS — BP 119/70 | HR 122 | Wt 175.4 lb

## 2015-03-19 DIAGNOSIS — Z36 Encounter for antenatal screening of mother: Secondary | ICD-10-CM

## 2015-03-19 DIAGNOSIS — Z369 Encounter for antenatal screening, unspecified: Secondary | ICD-10-CM

## 2015-03-19 DIAGNOSIS — O30033 Twin pregnancy, monochorionic/diamniotic, third trimester: Secondary | ICD-10-CM | POA: Diagnosis not present

## 2015-03-19 LAB — POCT URINALYSIS DIPSTICK
BILIRUBIN UA: NEGATIVE
Blood, UA: NEGATIVE
GLUCOSE UA: NEGATIVE
KETONES UA: NEGATIVE
LEUKOCYTES UA: NEGATIVE
Nitrite, UA: NEGATIVE
Protein, UA: NEGATIVE
Spec Grav, UA: 1.015
Urobilinogen, UA: NEGATIVE
pH, UA: 6

## 2015-03-19 NOTE — Progress Notes (Signed)
NONSTRESS TEST INTERPRETATION  INDICATIONS: TWINS, PERSISTENT TACHYCARDIA  FHR baseline: BABY A:  HR-120; BABY B: HR-130-140 RESULTS: REACTIVE COMMENTS:    PLAN: 1. Continue fetal kick counts twice a day. 2. Continue antepartum testing as scheduled-Biweekly   Fenton Mallingebbie Nishan Ovens, LPN

## 2015-03-22 ENCOUNTER — Ambulatory Visit (INDEPENDENT_AMBULATORY_CARE_PROVIDER_SITE_OTHER): Payer: Medicaid Other | Admitting: Obstetrics and Gynecology

## 2015-03-22 VITALS — BP 116/68 | HR 108 | Wt 178.5 lb

## 2015-03-22 DIAGNOSIS — Z3490 Encounter for supervision of normal pregnancy, unspecified, unspecified trimester: Secondary | ICD-10-CM

## 2015-03-22 DIAGNOSIS — O30033 Twin pregnancy, monochorionic/diamniotic, third trimester: Secondary | ICD-10-CM | POA: Diagnosis not present

## 2015-03-22 DIAGNOSIS — Z331 Pregnant state, incidental: Secondary | ICD-10-CM

## 2015-03-22 NOTE — Progress Notes (Signed)
NONSTRESS TEST INTERPRETATION  INDICATIONS: TWINS, PERSISTENT TACHYCARDIA  FHR baseline: BABY A: 130; BABY B: 120  RESULTS: REACTIVE COMMENTS: Dr. Valentino Saxonherry verified NST was okay and said pt could go home.    PLAN: 1. Continue fetal kick counts twice a day. 2. Continue antepartum testing as scheduled-Biweekly   Fenton Mallingebbie Novella Abraha, LPN

## 2015-03-22 NOTE — Progress Notes (Signed)
NST performed today was reviewed and was found to be reactive x 2. Few spontaneous shallow variable decelerations noted on Baby A tracing with good variability and quick return to baseline.   Continue recommended antenatal testing and prenatal care.

## 2015-03-22 NOTE — Telephone Encounter (Signed)
If patient experiencing chest pain, would recommend evaluation in the Emergency Room.

## 2015-03-24 ENCOUNTER — Encounter: Payer: Self-pay | Admitting: Obstetrics and Gynecology

## 2015-03-24 ENCOUNTER — Ambulatory Visit (INDEPENDENT_AMBULATORY_CARE_PROVIDER_SITE_OTHER): Payer: Medicaid Other | Admitting: Obstetrics and Gynecology

## 2015-03-24 VITALS — BP 121/79 | HR 108 | Wt 181.9 lb

## 2015-03-24 DIAGNOSIS — Z113 Encounter for screening for infections with a predominantly sexual mode of transmission: Secondary | ICD-10-CM

## 2015-03-24 DIAGNOSIS — Z369 Encounter for antenatal screening, unspecified: Secondary | ICD-10-CM

## 2015-03-24 DIAGNOSIS — O4690 Antepartum hemorrhage, unspecified, unspecified trimester: Secondary | ICD-10-CM | POA: Insufficient documentation

## 2015-03-24 DIAGNOSIS — Z36 Encounter for antenatal screening of mother: Secondary | ICD-10-CM

## 2015-03-24 DIAGNOSIS — O30033 Twin pregnancy, monochorionic/diamniotic, third trimester: Secondary | ICD-10-CM

## 2015-03-24 LAB — POCT URINALYSIS DIPSTICK
Bilirubin, UA: NEGATIVE
GLUCOSE UA: NEGATIVE
Ketones, UA: NEGATIVE
Nitrite, UA: NEGATIVE
PROTEIN UA: NEGATIVE
Spec Grav, UA: 1.01
UROBILINOGEN UA: NEGATIVE
pH, UA: 7

## 2015-03-24 LAB — OB RESULTS CONSOLE GBS: STREP GROUP B AG: POSITIVE

## 2015-03-24 NOTE — Progress Notes (Signed)
ROB: Patient complains of light vaginal bleeding daily over the past week. Notes stronger Deberah PeltonBraxton Hicks. States that she lost her mucus plug ~ 2 days ago. PTL and bleeding precautions given.  [facility], noting appropriate growth (discordance of 10%), both fetuses cephalic, no evidence of TTTS. Recommend delivery between 36-37 weeks barring no complications.  For repeat scan in 1-2 weeks.  Continue antenatal testing. Given dose of antenatal steroids today in light of symptoms and cervical dilation, will return tomorrow for 2nd dose. 3rd trimester labs performed today. RTC in 1 week.

## 2015-03-25 ENCOUNTER — Other Ambulatory Visit: Payer: Self-pay | Admitting: Obstetrics and Gynecology

## 2015-03-25 ENCOUNTER — Ambulatory Visit
Admission: RE | Admit: 2015-03-25 | Discharge: 2015-03-25 | Disposition: A | Discharge status: Home / Self Care | Payer: Medicaid Other | Source: Ambulatory Visit | Attending: Maternal & Fetal Medicine | Admitting: Maternal & Fetal Medicine

## 2015-03-25 ENCOUNTER — Ambulatory Visit (INDEPENDENT_AMBULATORY_CARE_PROVIDER_SITE_OTHER): Payer: Medicaid Other | Admitting: Obstetrics and Gynecology

## 2015-03-25 VITALS — BP 121/71 | HR 123 | Wt 180.5 lb

## 2015-03-25 VITALS — BP 128/76 | HR 110 | Temp 97.8°F | Resp 18 | Wt 182.0 lb

## 2015-03-25 DIAGNOSIS — Z3A34 34 weeks gestation of pregnancy: Secondary | ICD-10-CM | POA: Insufficient documentation

## 2015-03-25 DIAGNOSIS — Z36 Encounter for antenatal screening of mother: Secondary | ICD-10-CM

## 2015-03-25 DIAGNOSIS — O30033 Twin pregnancy, monochorionic/diamniotic, third trimester: Secondary | ICD-10-CM

## 2015-03-25 DIAGNOSIS — Z331 Pregnant state, incidental: Secondary | ICD-10-CM | POA: Diagnosis not present

## 2015-03-25 DIAGNOSIS — Z369 Encounter for antenatal screening, unspecified: Secondary | ICD-10-CM

## 2015-03-25 DIAGNOSIS — O99019 Anemia complicating pregnancy, unspecified trimester: Secondary | ICD-10-CM

## 2015-03-25 DIAGNOSIS — O30032 Twin pregnancy, monochorionic/diamniotic, second trimester: Secondary | ICD-10-CM

## 2015-03-25 DIAGNOSIS — O3433 Maternal care for cervical incompetence, third trimester: Secondary | ICD-10-CM | POA: Diagnosis not present

## 2015-03-25 LAB — POCT URINALYSIS DIPSTICK
Bilirubin, UA: NEGATIVE
GLUCOSE UA: NEGATIVE
Ketones, UA: NEGATIVE
Nitrite, UA: NEGATIVE
Protein, UA: NEGATIVE
Spec Grav, UA: 1.025
UROBILINOGEN UA: NEGATIVE
pH, UA: 6.5

## 2015-03-25 LAB — GC/CHLAMYDIA PROBE AMP
CHLAMYDIA, DNA PROBE: NEGATIVE
Neisseria gonorrhoeae by PCR: NEGATIVE

## 2015-03-25 MED ORDER — BETAMETHASONE SOD PHOS & ACET 6 (3-3) MG/ML IJ SUSP
12.0000 mg | Freq: Once | INTRAMUSCULAR | Status: AC
  Administered 2015-03-25: 12 mg via INTRAMUSCULAR

## 2015-03-25 NOTE — Progress Notes (Signed)
  NONSTRESS TEST INTERPRETATION  INDICATIONS:  MONO-DI TWINS,  FHR baseline:  BABY A: 130     BABY B: 135 RESULTS: REACTIVE COMMENTS:    PLAN: 1. Continue fetal kick counts twice a day. 2. Continue antepartum testing as scheduled-Biweekly   Fenton Mallingebbie Camry Robello, LPN

## 2015-03-27 LAB — CULTURE, BETA STREP (GROUP B ONLY): Strep Gp B Culture: POSITIVE — AB

## 2015-03-28 NOTE — Progress Notes (Signed)
NST performed today was reviewed and was found to be reactive.  Continue recommended antenatal testing and prenatal care.  Patient received 2nd dose of antenatal steroids today. To plan for delivery at 36 weeks.

## 2015-03-29 ENCOUNTER — Ambulatory Visit (INDEPENDENT_AMBULATORY_CARE_PROVIDER_SITE_OTHER): Payer: Medicaid Other | Admitting: Obstetrics and Gynecology

## 2015-03-29 VITALS — BP 118/72 | HR 128 | Wt 185.4 lb

## 2015-03-29 DIAGNOSIS — O4690 Antepartum hemorrhage, unspecified, unspecified trimester: Secondary | ICD-10-CM | POA: Diagnosis not present

## 2015-03-29 DIAGNOSIS — O30033 Twin pregnancy, monochorionic/diamniotic, third trimester: Secondary | ICD-10-CM

## 2015-03-29 NOTE — Progress Notes (Signed)
NONSTRESS TEST INTERPRETATION  INDICATIONS: Twin gestation  FHR baseline: Baby A 140bpm   Baby B 130bpm RESULTS: Reactive  COMMENTS: Initial BP reading elevated repeat wnl    PLAN: 1. Continue fetal kick counts twice a day. 2. Continue antepartum testing as scheduled-Biweekly 3. Pt c/o bilateral edema in feet and ankles, pt also notes that yesterday she was very flushed and red on her chest and face. Pt also notes that she is still having vaginal bleeding. Reiterated labor precautions.   Debbe [place] Zalia Hautala, CMA

## 2015-03-31 ENCOUNTER — Ambulatory Visit (INDEPENDENT_AMBULATORY_CARE_PROVIDER_SITE_OTHER): Payer: Medicaid Other | Admitting: Obstetrics and Gynecology

## 2015-03-31 VITALS — BP 105/71 | HR 97 | Wt 183.2 lb

## 2015-03-31 DIAGNOSIS — R519 Headache, unspecified: Secondary | ICD-10-CM

## 2015-03-31 DIAGNOSIS — O4693 Antepartum hemorrhage, unspecified, third trimester: Secondary | ICD-10-CM

## 2015-03-31 DIAGNOSIS — O30033 Twin pregnancy, monochorionic/diamniotic, third trimester: Secondary | ICD-10-CM

## 2015-03-31 DIAGNOSIS — Z22330 Carrier of Group B streptococcus: Secondary | ICD-10-CM

## 2015-03-31 DIAGNOSIS — O99820 Streptococcus B carrier state complicating pregnancy: Secondary | ICD-10-CM

## 2015-03-31 DIAGNOSIS — R51 Headache: Secondary | ICD-10-CM

## 2015-03-31 LAB — POCT URINALYSIS DIPSTICK
Bilirubin, UA: NEGATIVE
Glucose, UA: NEGATIVE
Ketones, UA: NEGATIVE
NITRITE UA: NEGATIVE
PROTEIN UA: NEGATIVE
SPEC GRAV UA: 1.01
UROBILINOGEN UA: NEGATIVE
pH, UA: 7

## 2015-03-31 NOTE — Progress Notes (Signed)
NST performed today for mono-di twin gestation was reviewed and was found to be reactive.  Continue recommended antenatal testing and prenatal care.  Hildred LaserAnika Osten Janek, MD [facility]

## 2015-03-31 NOTE — Progress Notes (Signed)
ROB: Patient notes bad headache over past 2 days.  Did not take any medication for it, just laid down.  Also reports elevated BP at home 140s/80s. Currently headache is mild.  BPs wnl.  Advised on Tylenol or T#3. Continues to note vaginal bleeding, slightly increased over the weekend, but now none noted today.   Scheduled for IOL on 04/09/2015. Has next [facility] appointment and scan next week.  Continue twice weekly NSTs. Given bleeding, labor precautions.  GBS positive.  Will need antibiotics during labor. RTC in 1 week.

## 2015-03-31 NOTE — Patient Instructions (Signed)
Group B streptococcus (GBS) is a type of bacteria often found in healthy women. GBS is not the same as the bacteria that causes strep throat. You may have GBS in your vagina, rectum, or bladder. GBS does not spread through sexual contact, but it can be passed to a baby during childbirth. This can be dangerous for your baby. It is not dangerous to you and usually does not cause any symptoms. Your health care provider may test you for GBS when your pregnancy is between 35 and 37 weeks. GBS is dangerous only during birth, so there is no need to test for it earlier. It is possible to have GBS during pregnancy and never pass it to your baby. If your test results are positive for GBS, your health care provider may recommend giving you antibiotic medicine during delivery to make sure your baby stays healthy. RISK FACTORS You are more likely to pass GBS to your baby if:   Your water breaks (ruptured membrane) or you go into labor before 37 weeks.  Your water breaks 18 hours before you deliver.  You passed GBS during a previous pregnancy.  You have a urinary tract infection caused by GBS any time during pregnancy.  You have a fever during labor. SYMPTOMS Most women who have GBS do not have any symptoms. If you have a urinary tract infection caused by GBS, you might have frequent or painful urination and fever. Babies who get GBS usually show symptoms within 7 days of birth. Symptoms may include:   Breathing problems.  Heart and blood pressure problems.  Digestive and kidney problems. DIAGNOSIS Routine screening for GBS is recommended for all pregnant women. A health care provider takes a sample of the fluid in your vagina and rectum with a swab. It is then sent to a lab to be checked for GBS. A sample of your urine may also be checked for the bacteria.  TREATMENT If you test positive for GBS, you may need treatment with an antibiotic medicine during labor. As soon as you go into labor, or as soon as  your membranes rupture, you will get the antibiotic medicine through an IV access. You will continue to get the medicine until after you give birth. You do not need antibiotic medicine if you are having a cesarean delivery.If your baby shows signs or symptoms of GBS after birth, your baby can also be treated with an antibiotic medicine. HOME CARE INSTRUCTIONS   Take all antibiotic medicine as prescribed by your health care provider. Only take medicine as directed.   Continue with prenatal visits and care.   Keep all follow-up appointments.  SEEK MEDICAL CARE IF:   You have pain when you urinate.   You have to urinate frequently.   You have a fever.  SEEK IMMEDIATE MEDICAL CARE IF:   Your membranes rupture.  You go into labor.   This information is not intended to replace advice given to you by your health care provider. Make sure you discuss any questions you have with your health care provider.   Document Released: 04/04/2007 Document Revised: 12/31/2012 Document Reviewed: 10/18/2012 Elsevier Interactive Patient Education 2016 Elsevier Inc.  

## 2015-04-01 ENCOUNTER — Ambulatory Visit (INDEPENDENT_AMBULATORY_CARE_PROVIDER_SITE_OTHER): Payer: Medicaid Other | Admitting: Obstetrics and Gynecology

## 2015-04-01 ENCOUNTER — Encounter: Payer: Medicaid Other | Admitting: Obstetrics and Gynecology

## 2015-04-01 VITALS — BP 117/68 | HR 128 | Wt 188.4 lb

## 2015-04-01 DIAGNOSIS — Z36 Encounter for antenatal screening of mother: Secondary | ICD-10-CM

## 2015-04-01 DIAGNOSIS — Z3490 Encounter for supervision of normal pregnancy, unspecified, unspecified trimester: Secondary | ICD-10-CM

## 2015-04-01 DIAGNOSIS — O30033 Twin pregnancy, monochorionic/diamniotic, third trimester: Secondary | ICD-10-CM | POA: Diagnosis not present

## 2015-04-01 DIAGNOSIS — Z331 Pregnant state, incidental: Secondary | ICD-10-CM

## 2015-04-01 DIAGNOSIS — O4690 Antepartum hemorrhage, unspecified, unspecified trimester: Secondary | ICD-10-CM | POA: Diagnosis not present

## 2015-04-01 DIAGNOSIS — Z369 Encounter for antenatal screening, unspecified: Secondary | ICD-10-CM

## 2015-04-01 LAB — POCT URINALYSIS DIPSTICK
Bilirubin, UA: NEGATIVE
Glucose, UA: NEGATIVE
KETONES UA: NEGATIVE
NITRITE UA: NEGATIVE
PH UA: 6
PROTEIN UA: NEGATIVE
Spec Grav, UA: 1.01
Urobilinogen, UA: NEGATIVE

## 2015-04-01 NOTE — Progress Notes (Signed)
NONSTRESS TEST INTERPRETATION  INDICATIONS: MONO-Di TWINS  FHR baseline: Baby A: 130-150    Baby B: 130-140 RESULTS:  reactive COMMENTS:  Contraction x1   PLAN: 1. Continue fetal kick counts twice a day. 2. Continue antepartum testing as scheduled-Biweekly   Fenton Mallingebbie Winn Muehl, LPN

## 2015-04-05 ENCOUNTER — Ambulatory Visit (INDEPENDENT_AMBULATORY_CARE_PROVIDER_SITE_OTHER): Payer: Medicaid Other | Admitting: Obstetrics and Gynecology

## 2015-04-05 VITALS — BP 124/75 | HR 124 | Wt 185.2 lb

## 2015-04-05 DIAGNOSIS — Z1389 Encounter for screening for other disorder: Secondary | ICD-10-CM | POA: Diagnosis not present

## 2015-04-05 DIAGNOSIS — R109 Unspecified abdominal pain: Secondary | ICD-10-CM

## 2015-04-05 DIAGNOSIS — R82998 Other abnormal findings in urine: Secondary | ICD-10-CM

## 2015-04-05 DIAGNOSIS — O30033 Twin pregnancy, monochorionic/diamniotic, third trimester: Secondary | ICD-10-CM

## 2015-04-05 DIAGNOSIS — N390 Urinary tract infection, site not specified: Secondary | ICD-10-CM

## 2015-04-05 LAB — POCT URINALYSIS DIPSTICK
BILIRUBIN UA: NEGATIVE
Glucose, UA: NEGATIVE
KETONES UA: NEGATIVE
Nitrite, UA: NEGATIVE
PROTEIN UA: NEGATIVE
Spec Grav, UA: 1.01
Urobilinogen, UA: NEGATIVE
pH, UA: 5

## 2015-04-05 NOTE — Progress Notes (Signed)
   NONSTRESS TEST INTERPRETATION  INDICATIONS: MONO-DI TWINS  FHR baseline: Baby A: 130-140  Baby B: 130-140 RESULTS: reactive COMMENTS: pt c/o some cramping, scant amt of bleeding. UC sent to lab. Moderate blood and large leuks.   PLAN: 1. Continue fetal kick counts twice a day. 2. Continue antepartum testing as scheduled-Biweekly   Fenton Mallingebbie Cherlynn Popiel, LPN

## 2015-04-07 LAB — CULTURE, OB URINE

## 2015-04-07 LAB — URINE CULTURE, OB REFLEX

## 2015-04-08 ENCOUNTER — Ambulatory Visit
Admission: RE | Admit: 2015-04-08 | Discharge: 2015-04-08 | Disposition: A | Discharge status: Home / Self Care | Payer: Medicaid Other | Source: Ambulatory Visit | Attending: Obstetrics and Gynecology | Admitting: Obstetrics and Gynecology

## 2015-04-08 ENCOUNTER — Ambulatory Visit (INDEPENDENT_AMBULATORY_CARE_PROVIDER_SITE_OTHER): Payer: Medicaid Other | Admitting: Obstetrics and Gynecology

## 2015-04-08 ENCOUNTER — Encounter: Payer: Medicaid Other | Admitting: Obstetrics and Gynecology

## 2015-04-08 ENCOUNTER — Encounter: Payer: Self-pay | Admitting: Obstetrics and Gynecology

## 2015-04-08 VITALS — BP 127/70 | HR 111 | Wt 185.2 lb

## 2015-04-08 DIAGNOSIS — Z22330 Carrier of Group B streptococcus: Secondary | ICD-10-CM

## 2015-04-08 DIAGNOSIS — O30032 Twin pregnancy, monochorionic/diamniotic, second trimester: Secondary | ICD-10-CM

## 2015-04-08 DIAGNOSIS — Z3A36 36 weeks gestation of pregnancy: Secondary | ICD-10-CM

## 2015-04-08 DIAGNOSIS — Z3493 Encounter for supervision of normal pregnancy, unspecified, third trimester: Secondary | ICD-10-CM

## 2015-04-08 DIAGNOSIS — Z3483 Encounter for supervision of other normal pregnancy, third trimester: Secondary | ICD-10-CM

## 2015-04-08 DIAGNOSIS — O30033 Twin pregnancy, monochorionic/diamniotic, third trimester: Secondary | ICD-10-CM

## 2015-04-08 DIAGNOSIS — Z36 Encounter for antenatal screening of mother: Secondary | ICD-10-CM

## 2015-04-08 DIAGNOSIS — O43022 Fetus-to-fetus placental transfusion syndrome, second trimester: Secondary | ICD-10-CM

## 2015-04-08 DIAGNOSIS — O479 False labor, unspecified: Secondary | ICD-10-CM

## 2015-04-08 DIAGNOSIS — O99820 Streptococcus B carrier state complicating pregnancy: Secondary | ICD-10-CM

## 2015-04-08 DIAGNOSIS — Z1389 Encounter for screening for other disorder: Secondary | ICD-10-CM

## 2015-04-08 LAB — POCT URINALYSIS DIPSTICK
Bilirubin, UA: NEGATIVE
GLUCOSE UA: NEGATIVE
Ketones, UA: NEGATIVE
NITRITE UA: NEGATIVE
PROTEIN UA: NEGATIVE
RBC UA: NEGATIVE
Spec Grav, UA: 1.02
UROBILINOGEN UA: NEGATIVE
pH, UA: 7

## 2015-04-08 NOTE — Progress Notes (Addendum)
ROB: Patient still noting irregular contractions.  Seen by [facility] earlier today, notes ultrasound was normal, growth normal for both twins, and vertex/vertex position.  Awaiting report in EPIC. NST performed today was reviewed and was found to be reactive.  Scheduled for IOL tomorrow morning for mono-di twin gestation. Labor and fetal kick count precautions given.   NONSTRESS TEST INTERPRETATION  INDICATIONS: MONO-DI TWINS  FHR baseline: Baby A: 145 Baby B: 140  RESULTS: reactive  COMMENTS: Irregular contractions noted on monitor, q 5-8 min.   PLAN:  1. Continue fetal kick counts twice a day.  2. Scheduled for IOL tomorrow.

## 2015-04-09 ENCOUNTER — Inpatient Hospital Stay: Payer: Medicaid Other | Admitting: Anesthesiology

## 2015-04-09 ENCOUNTER — Encounter: Payer: Self-pay | Admitting: Obstetrics and Gynecology

## 2015-04-09 ENCOUNTER — Inpatient Hospital Stay
Admission: EM | Admit: 2015-04-09 | Discharge: 2015-04-11 | DRG: 774 | Disposition: A | Discharge status: Home / Self Care | Payer: Medicaid Other | Attending: Obstetrics and Gynecology | Admitting: Obstetrics and Gynecology

## 2015-04-09 DIAGNOSIS — Z79899 Other long term (current) drug therapy: Secondary | ICD-10-CM

## 2015-04-09 DIAGNOSIS — Z87891 Personal history of nicotine dependence: Secondary | ICD-10-CM | POA: Diagnosis not present

## 2015-04-09 DIAGNOSIS — Z7982 Long term (current) use of aspirin: Secondary | ICD-10-CM | POA: Diagnosis not present

## 2015-04-09 DIAGNOSIS — Z91018 Allergy to other foods: Secondary | ICD-10-CM | POA: Diagnosis not present

## 2015-04-09 DIAGNOSIS — O99824 Streptococcus B carrier state complicating childbirth: Secondary | ICD-10-CM | POA: Diagnosis present

## 2015-04-09 DIAGNOSIS — R000 Tachycardia, unspecified: Secondary | ICD-10-CM | POA: Diagnosis present

## 2015-04-09 DIAGNOSIS — Z3A36 36 weeks gestation of pregnancy: Secondary | ICD-10-CM

## 2015-04-09 DIAGNOSIS — Z833 Family history of diabetes mellitus: Secondary | ICD-10-CM

## 2015-04-09 DIAGNOSIS — O9902 Anemia complicating childbirth: Principal | ICD-10-CM | POA: Diagnosis present

## 2015-04-09 DIAGNOSIS — Z888 Allergy status to other drugs, medicaments and biological substances status: Secondary | ICD-10-CM

## 2015-04-09 DIAGNOSIS — O99019 Anemia complicating pregnancy, unspecified trimester: Secondary | ICD-10-CM

## 2015-04-09 DIAGNOSIS — O9942 Diseases of the circulatory system complicating childbirth: Secondary | ICD-10-CM | POA: Diagnosis present

## 2015-04-09 DIAGNOSIS — O30033 Twin pregnancy, monochorionic/diamniotic, third trimester: Secondary | ICD-10-CM | POA: Diagnosis present

## 2015-04-09 DIAGNOSIS — Z3483 Encounter for supervision of other normal pregnancy, third trimester: Secondary | ICD-10-CM

## 2015-04-09 DIAGNOSIS — O99013 Anemia complicating pregnancy, third trimester: Secondary | ICD-10-CM | POA: Diagnosis present

## 2015-04-09 DIAGNOSIS — O3483 Maternal care for other abnormalities of pelvic organs, third trimester: Secondary | ICD-10-CM | POA: Diagnosis present

## 2015-04-09 DIAGNOSIS — D649 Anemia, unspecified: Secondary | ICD-10-CM | POA: Diagnosis present

## 2015-04-09 DIAGNOSIS — O99820 Streptococcus B carrier state complicating pregnancy: Secondary | ICD-10-CM

## 2015-04-09 LAB — CBC
HCT: 31.9 % — ABNORMAL LOW (ref 35.0–47.0)
Hemoglobin: 10.9 g/dL — ABNORMAL LOW (ref 12.0–16.0)
MCH: 30.5 pg (ref 26.0–34.0)
MCHC: 34.3 g/dL (ref 32.0–36.0)
MCV: 88.7 fL (ref 80.0–100.0)
Platelets: 182 10*3/uL (ref 150–440)
RBC: 3.59 MIL/uL — AB (ref 3.80–5.20)
RDW: 14.6 % — ABNORMAL HIGH (ref 11.5–14.5)
WBC: 12.1 10*3/uL — ABNORMAL HIGH (ref 3.6–11.0)

## 2015-04-09 LAB — TYPE AND SCREEN
ABO/RH(D): A POS
ANTIBODY SCREEN: NEGATIVE

## 2015-04-09 LAB — ABO/RH: ABO/RH(D): A POS

## 2015-04-09 MED ORDER — FENTANYL CITRATE (PF) 100 MCG/2ML IJ SOLN
50.0000 ug | INTRAMUSCULAR | Status: DC | PRN
  Administered 2015-04-09: 100 ug via INTRAVENOUS
  Filled 2015-04-09: qty 2

## 2015-04-09 MED ORDER — ZOLPIDEM TARTRATE 5 MG PO TABS
5.0000 mg | ORAL_TABLET | Freq: Every evening | ORAL | Status: DC | PRN
Start: 2015-04-09 — End: 2015-04-11

## 2015-04-09 MED ORDER — OXYTOCIN BOLUS FROM INFUSION
500.0000 mL | INTRAVENOUS | Status: DC
  Administered 2015-04-09: 500 mL via INTRAVENOUS

## 2015-04-09 MED ORDER — LIDOCAINE HCL (PF) 1 % IJ SOLN
INTRAMUSCULAR | Status: AC
  Filled 2015-04-09: qty 30

## 2015-04-09 MED ORDER — OXYTOCIN 40 UNITS IN LACTATED RINGERS INFUSION - SIMPLE MED: 2.5000 [IU]/h | INTRAVENOUS | Status: DC

## 2015-04-09 MED ORDER — ONDANSETRON HCL 4 MG/2ML IJ SOLN: 4.0000 mg | INTRAMUSCULAR | Status: DC | PRN

## 2015-04-09 MED ORDER — DIBUCAINE 1 % RE OINT: 1.0000 "application " | TOPICAL_OINTMENT | RECTAL | Status: DC | PRN

## 2015-04-09 MED ORDER — FERROUS SULFATE 325 (65 FE) MG PO TABS
325.0000 mg | ORAL_TABLET | Freq: Two times a day (BID) | ORAL | Status: DC
  Administered 2015-04-10 – 2015-04-11 (×3): 325 mg via ORAL
  Filled 2015-04-09 (×3): qty 1

## 2015-04-09 MED ORDER — OXYTOCIN 40 UNITS IN LACTATED RINGERS INFUSION - SIMPLE MED
INTRAVENOUS | Status: AC
  Filled 2015-04-09: qty 1000

## 2015-04-09 MED ORDER — ACETAMINOPHEN 325 MG PO TABS: 650.0000 mg | ORAL_TABLET | ORAL | Status: DC | PRN

## 2015-04-09 MED ORDER — TERBUTALINE SULFATE 1 MG/ML IJ SOLN
0.2500 mg | Freq: Once | INTRAMUSCULAR | Status: DC | PRN
  Filled 2015-04-09: qty 1

## 2015-04-09 MED ORDER — OXYTOCIN 10 UNIT/ML IJ SOLN
INTRAMUSCULAR | Status: AC
  Filled 2015-04-09: qty 2

## 2015-04-09 MED ORDER — ONDANSETRON HCL 4 MG/2ML IJ SOLN: 4.0000 mg | Freq: Four times a day (QID) | INTRAMUSCULAR | Status: DC | PRN

## 2015-04-09 MED ORDER — LACTATED RINGERS IV SOLN
INTRAVENOUS | Status: DC
  Administered 2015-04-09: 09:00:00 via INTRAVENOUS

## 2015-04-09 MED ORDER — SODIUM CHLORIDE 0.9 % IV SOLN
1.0000 g | INTRAVENOUS | Status: DC
  Administered 2015-04-09: 1 g via INTRAVENOUS
  Filled 2015-04-09: qty 1000

## 2015-04-09 MED ORDER — WITCH HAZEL-GLYCERIN EX PADS: 1.0000 "application " | MEDICATED_PAD | CUTANEOUS | Status: DC | PRN

## 2015-04-09 MED ORDER — CITRIC ACID-SODIUM CITRATE 334-500 MG/5ML PO SOLN: 30.0000 mL | ORAL | Status: DC | PRN

## 2015-04-09 MED ORDER — DIPHENHYDRAMINE HCL 25 MG PO CAPS: 25.0000 mg | ORAL_CAPSULE | Freq: Four times a day (QID) | ORAL | Status: DC | PRN

## 2015-04-09 MED ORDER — LIDOCAINE HCL (PF) 1 % IJ SOLN: 30.0000 mL | INTRAMUSCULAR | Status: DC | PRN

## 2015-04-09 MED ORDER — MISOPROSTOL 200 MCG PO TABS
ORAL_TABLET | ORAL | Status: AC
  Filled 2015-04-09: qty 4

## 2015-04-09 MED ORDER — ONDANSETRON HCL 4 MG PO TABS
4.0000 mg | ORAL_TABLET | ORAL | Status: DC | PRN
Start: 2015-04-09 — End: 2015-04-11

## 2015-04-09 MED ORDER — DOCUSATE SODIUM 100 MG PO CAPS
100.0000 mg | ORAL_CAPSULE | Freq: Two times a day (BID) | ORAL | Status: DC
  Administered 2015-04-10 – 2015-04-11 (×3): 100 mg via ORAL
  Filled 2015-04-09 (×3): qty 1

## 2015-04-09 MED ORDER — PRENATAL MULTIVITAMIN CH
1.0000 | ORAL_TABLET | Freq: Every day | ORAL | Status: DC
  Administered 2015-04-10: 1 via ORAL
  Filled 2015-04-09 (×2): qty 1

## 2015-04-09 MED ORDER — AMMONIA AROMATIC IN INHA
RESPIRATORY_TRACT | Status: AC
  Filled 2015-04-09: qty 10

## 2015-04-09 MED ORDER — LACTATED RINGERS IV SOLN: 500.0000 mL | INTRAVENOUS | Status: DC | PRN

## 2015-04-09 MED ORDER — SIMETHICONE 80 MG PO CHEW: 80.0000 mg | CHEWABLE_TABLET | ORAL | Status: DC | PRN

## 2015-04-09 MED ORDER — BENZOCAINE-MENTHOL 20-0.5 % EX AERO: 1.0000 "application " | INHALATION_SPRAY | CUTANEOUS | Status: DC | PRN

## 2015-04-09 MED ORDER — OXYTOCIN 40 UNITS IN LACTATED RINGERS INFUSION - SIMPLE MED
1.0000 m[IU]/min | INTRAVENOUS | Status: DC
  Administered 2015-04-09: 2 m[IU]/min via INTRAVENOUS
  Filled 2015-04-09 (×2): qty 1000

## 2015-04-09 MED ORDER — LANOLIN HYDROUS EX OINT: TOPICAL_OINTMENT | CUTANEOUS | Status: DC | PRN

## 2015-04-09 MED ORDER — ACETAMINOPHEN 325 MG PO TABS
650.0000 mg | ORAL_TABLET | ORAL | Status: DC | PRN
  Administered 2015-04-09: 650 mg via ORAL
  Filled 2015-04-09 (×2): qty 2

## 2015-04-09 MED ORDER — ACETAMINOPHEN-CODEINE #3 300-30 MG PO TABS
1.0000 | ORAL_TABLET | ORAL | Status: DC | PRN
  Administered 2015-04-10: 2 via ORAL
  Filled 2015-04-09: qty 2

## 2015-04-09 MED ORDER — SODIUM CHLORIDE 0.9 % IV SOLN
2.0000 g | Freq: Once | INTRAVENOUS | Status: AC
  Administered 2015-04-09: 2 g via INTRAVENOUS
  Filled 2015-04-09: qty 2000

## 2015-04-09 MED ORDER — IBUPROFEN 800 MG PO TABS
800.0000 mg | ORAL_TABLET | Freq: Four times a day (QID) | ORAL | Status: DC
  Administered 2015-04-09 – 2015-04-11 (×7): 800 mg via ORAL
  Filled 2015-04-09 (×8): qty 1

## 2015-04-09 NOTE — H&P (Addendum)
Obstetric History and Physical  Colleen Lang is a 20 y.o. G2P1001 with IUP at [redacted]w[redacted]d presenting for scheduled IOL secondary to monochorionic diamniotic twin gestation. Patient states she has been having  irregular, every 5-8 minutes contractions, minimal vaginal bleeding, intact membranes, with active fetal movement.    Prenatal Course Source of Care: [facility] with onset of care at 12 weeks Pregnancy complications or risks: Patient Active Problem List   Diagnosis Date Noted  . GBS (group B Streptococcus carrier), +RV culture, currently pregnant 03/31/2015  . Vaginal bleeding during pregnancy, antepartum 03/24/2015  . Supervision of normal pregnancy in third trimester 02/19/2015  . Anemia affecting pregnancy 02/10/2015  . Sinus tachycardia (HCC) 01/12/2015  . Maternal hypotension syndrome in third trimester 12/08/2014  . Monochorionic diamniotic twin gestation in third trimester 10/22/2014  . Menometrorrhagia 08/19/2014  . History of ovarian cyst 08/19/2014   She plans to breastfeed She desires oral contraceptives (estrogen/progesterone) for postpartum contraception.   Prenatal labs and studies: ABO, Rh: A/Positive/-- (09/28 1430) Antibody: Negative (09/28 1430) Rubella: 3.92 (09/28 1430) RPR: Non Reactive (09/28 1430)  HBsAg: Negative (09/28 1430)  HIV: Non Reactive (09/28 1430)  GBS: Positive  (03/15 0300) 1 hr Glucola 91 (normal) Genetic screening normal Anatomy US normal   Past Medical History  Diagnosis Date  . Abnormal uterine bleeding (AUB)     x 4 months  . Pelvic pain in female   . AR (allergic rhinitis)   . Ovarian cyst     seen on u/s 6 months ago- per pt they were small  . Breast feeding status of mother     breast feeding 35 year old  . History of chlamydia infection 2014    Past Surgical History  Procedure Laterality Date  . Knee surgery      x2  . Wisdom tooth extraction    . Tonsillectomy      OB History  Gravida Para Term Preterm  AB SAB TAB Ectopic Multiple Living  # Outcome Date GA Lbr Len/2nd Weight Sex Delivery Anes PTL Lv  2 Current           1 Term 2014   9 lb 1.6 oz (4.128 kg) M Vag-Spont   Y      Social History   Social History  . Marital Status: Single    Spouse Name: N/A  . Number of Children: N/A  . Years of Education: N/A   Social History Main Topics  . Smoking status: Former Smoker -- 0.25 packs/day    Types: Cigarettes  . Smokeless tobacco: Not on file  . Alcohol Use: No  . Drug Use: No  . Sexual Activity: Yes    Birth Control/ Protection: None   Other Topics Concern  . Not on file   Social History Narrative    Family History  Problem Relation Age of Onset  . Diabetes Paternal Grandmother   . Diabetes Paternal Grandfather   . Cancer Neg Hx   . Ovarian cancer Neg Hx   . Breast cancer Neg Hx   . Heart disease Neg Hx     Prescriptions prior to admission  Medication Sig Dispense Refill Last Dose  . acetaminophen-codeine (TYLENOL #3) 300-30 MG tablet Take 1 tablet by mouth every 6 (six) hours as needed for moderate pain. 30 tablet 1 Taking  . aspirin EC 81 MG tablet Take 1 tablet (81 mg total) by mouth  daily. 300 tablet 1 Taking  . cetirizine (ZYRTEC) 10 MG tablet Reported on 03/25/2015  3 Taking  . doxylamine, Sleep, (UNISOM) 25 MG tablet Take 0.5 tablets (12.5 mg total) by mouth at bedtime. 30 tablet 0 Taking  . ferrous sulfate 325 (65 FE) MG EC tablet Take 1 tablet (325 mg total) by mouth 3 (three) times daily with meals. 90 tablet 3 Taking  . folic acid (FOLVITE) 1 MG tablet Take 1 tablet (1 mg total) by mouth daily. 30 tablet 6 Taking  . Iron-FA-B Cmp-C-Biot-Probiotic (FUSION PLUS) CAPS Take 1 capsule by mouth daily. 60 capsule 2 Taking  . pantoprazole (PROTONIX) 20 MG tablet Take 1 tablet (20 mg total) by mouth 2 (two) times daily. 60 tablet 3 Taking  . Prenat-FeFum-FePo-FA-Omega 3 (CONCEPT DHA) 53.5-38-1 MG CAPS Take 1 capsule by mouth daily. 30 capsule 12  Taking  . PRENATAL 28-0.8 MG TABS Take by mouth. Reported on 03/25/2015   Taking  . vitamin B-6 (PYRIDOXINE) 25 MG tablet Take 1 tablet (25 mg total) by mouth 3 (three) times daily. 90 tablet 1 Taking    Allergies  Allergen Reactions  . Ultram [Tramadol Hcl] Anaphylaxis    Hard to breathe  . Other Rash    honey    Review of Systems: Negative except for what is mentioned in HPI.  Physical Exam: BP 127/88 mmHg  Pulse 117  Temp(Src) 98.1 F (36.7 C) (Axillary)  Ht  (1.702 m)  Wt 185 lb (83.915 kg)  BMI 28.97 kg/m2  LMP  (LMP Unknown) CONSTITUTIONAL: Well-developed, well-nourished female in no acute distress.  HENT:  Normocephalic, atraumatic, External right and left ear normal. Oropharynx is clear and moist EYES: Conjunctivae and EOM are normal. Pupils are equal, round, and reactive to light. No scleral icterus.  NECK: Normal range of motion, supple, no masses SKIN: Skin is warm and dry. No rash noted. Not diaphoretic. No erythema. No pallor. NEUROLOGIC: Alert and oriented to person, place, and time. Normal reflexes, muscle tone coordination. No cranial nerve deficit noted. PSYCHIATRIC: Normal mood and affect. Normal behavior. Normal judgment and thought content. CARDIOVASCULAR: Normal heart rate noted, regular rhythm RESPIRATORY: Effort and breath sounds normal, no problems with respiration noted ABDOMEN: Soft, nontender, nondistended, gravid. MUSCULOSKELETAL: Normal range of motion. No edema and no tenderness. 2+ distal pulses.  Cervical Exam: Dilatation 4 cm   Effacement 60%   Station -2   Presentation: cephalic FHT:  Baseline rate Baby A 130 and  Baby B 135 bpm   Variability moderate  Accelerations present   Decelerations none Contractions: Every 5-7 mins   Pertinent Labs/Studies:   Results for orders placed or performed during the hospital encounter of 04/09/15 (from the past 24 hour(s))  CBC     Status: Abnormal   Collection Time: 04/09/15  9:09 AM  Result Value  Ref Range   WBC 12.1 (H) 3.6 - 11.0 K/uL   RBC 3.59 (L) 3.80 - 5.20 MIL/uL   Hemoglobin 10.9 (L) 12.0 - 16.0 g/dL   HCT 81.1 (L) 91.4 - 78.2 %   MCV 88.7 80.0 - 100.0 fL   MCH 30.5 26.0 - 34.0 pg   MCHC 34.3 32.0 - 36.0 g/dL   RDW 95.6 (H) 21.3 - 08.6 %   Platelets 182 150 - 440 K/uL  Type and screen     Status: None (Preliminary result)   Collection Time: 04/09/15  9:09 AM  Result Value Ref Range   ABO/RH(D) A POS    Antibody  Screen PENDING    Sample Expiration 04/12/2015     Assessment : Colleen Lang is a 20 y.o. G2P1001 at 7319w2d being admitted for scheduled IOL for mono-di twin gestation. Maternal persistent tachycardia  Plan: Labor:  Induction/Augmentation as needed, per protocol.  Continue to monitor maternal tachycardia (likely physiologic from twin pregnancy) FWB: Reassuring fetal heart tracing.  GBS positive.  Will treat with Ampicillin. To get 1st 2 doses prior to initiation of Pitocin.  Delivery plan: Hopeful for vaginal delivery x 2  Hildred LaserAnika Ali Mohl, MD [facility]

## 2015-04-09 NOTE — Anesthesia Preprocedure Evaluation (Signed)
Anesthesia Evaluation  Patient identified by MRN, date of birth, ID band Patient awake    Reviewed: Allergy & Precautions, NPO status , Patient's Chart, lab work & pertinent test results  History of Anesthesia Complications Negative for: history of anesthetic complications  Airway Mallampati: II       Dental   Pulmonary neg pulmonary ROS, former smoker,           Cardiovascular negative cardio ROS       Neuro/Psych negative neurological ROS     GI/Hepatic negative GI ROS, Neg liver ROS,   Endo/Other  negative endocrine ROS  Renal/GU negative Renal ROS     Musculoskeletal   Abdominal   Peds  Hematology negative hematology ROS (+) anemia ,   Anesthesia Other Findings   Reproductive/Obstetrics (+) Pregnancy                             Anesthesia Physical Anesthesia Plan  ASA: II and emergent  Anesthesia Plan:    Post-op Pain Management:    Induction:   Airway Management Planned:   Additional Equipment:   Intra-op Plan:   Post-operative Plan:   Informed Consent: I have reviewed the patients History and Physical, chart, labs and discussed the procedure including the risks, benefits and alternatives for the proposed anesthesia with the patient or authorized representative who has indicated his/her understanding and acceptance.     Plan Discussed with:   Anesthesia Plan Comments:         Anesthesia Quick Evaluation

## 2015-04-09 NOTE — Anesthesia Postprocedure Evaluation (Signed)
Anesthesia Post Note  Patient: Colleen Lang  Procedure(s) Performed: * No procedures listed *  Patient location during evaluation: L&D Level of consciousness: awake and alert Vital Signs Assessment: post-procedure vital signs reviewed and stable Respiratory status: spontaneous breathing and respiratory function stable Cardiovascular status: stable Anesthetic complications: no    Last Vitals:  Filed Vitals:   04/09/15 1800 04/09/15 1806  BP:  128/80  Pulse:  104  Temp: 36.7 C   Resp:      Last Pain:  Filed Vitals:   04/09/15 1806  PainSc: 10-Worst pain ever                 KEPHART,WILLIAM K

## 2015-04-09 NOTE — Progress Notes (Signed)
Intrapartum Progress Note  S: Patient doing well, no complaints.   O: Blood pressure 114/61, pulse 118, temperature 98.5 F (36.9 C), temperature source Axillary, resp. rate 15, height 5\' 7"  (1.702 m), weight 185 lb (83.915 kg). Gen App: NAD, comfortable Abdomen: soft, gravid FHT: baseline  140 (Baby A), 140 (Baby B) bpm.  Accels present.  Decels absent. moderate in degree variability.   Tocometer: contractions q 2-3 minutes (irregular) Cervix: 5/70/-2/cephalic Extremities: Nontender, no edema.  Pitocin: 12 mIU  Labs: No new labs.   Assessment:  1: SIUP at 2826w2d 2. Mono-di twin gestation 843. GBS positive  Plan:  1. Continue IOL with Pitocin 2. AROM with clear fluid.  3. Continue Ampicillin for GBS positive status.  Has received 2 doses prior to AROM.    Hildred LaserAnika Karina Lenderman, MD

## 2015-04-10 LAB — CBC
HEMATOCRIT: 28.2 % — AB (ref 35.0–47.0)
Hemoglobin: 9.4 g/dL — ABNORMAL LOW (ref 12.0–16.0)
MCH: 30.2 pg (ref 26.0–34.0)
MCHC: 33.5 g/dL (ref 32.0–36.0)
MCV: 90.1 fL (ref 80.0–100.0)
Platelets: 152 10*3/uL (ref 150–440)
RBC: 3.13 MIL/uL — ABNORMAL LOW (ref 3.80–5.20)
RDW: 14.6 % — AB (ref 11.5–14.5)
WBC: 12.1 10*3/uL — AB (ref 3.6–11.0)

## 2015-04-10 LAB — RPR: RPR: NONREACTIVE

## 2015-04-10 NOTE — Progress Notes (Signed)
Post Partum Day #1, s/p SVD  Subjective: no complaints, up ad lib, voiding and tolerating PO  Objective: Temp:  [97.8 F (36.6 C)-98.6 F (37 C)] 98.5 F (36.9 C) (04/01 0750) Pulse Rate:  [72-119] 93 (04/01 0750) Resp:  [18] 18 (04/01 0750) BP: (101-134)/(57-90) 105/68 mmHg (04/01 0750) SpO2:  [99 %] 99 % (04/01 0750)  Physical Exam:  General: alert and no distress  Lungs: clear to auscultation bilaterally Breasts: normal appearance, no masses or tenderness Heart: regular rate and rhythm, S1, S2 normal, no murmur, click, rub or gallop Pelvis: Lochia: appropriate, Uterine Fundus: firm Extremities: DVT Evaluation: Negative Homan's sign. No cords or calf tenderness. No significant calf/ankle edema.   Recent Labs  04/09/15 0909 04/10/15 0530  HGB 10.9* 9.4*  HCT 31.9* 28.2*    Assessment/Plan: Plan for discharge tomorrow, Breastfeeding and Contraception OCPs (combined) Anemia, asymptomatic, can continue to treat with PO iron daily.    LOS: 1 day   Hildred LaserAnika Jaci Desanto [facility]

## 2015-04-11 MED ORDER — IBUPROFEN 800 MG PO TABS: 800.0000 mg | ORAL_TABLET | Freq: Four times a day (QID) | ORAL | Status: DC

## 2015-04-11 MED ORDER — DOCUSATE SODIUM 100 MG PO CAPS: 100.0000 mg | ORAL_CAPSULE | Freq: Two times a day (BID) | ORAL | Status: DC

## 2015-04-11 MED ORDER — FERROUS SULFATE 325 (65 FE) MG PO TBEC: 325.0000 mg | DELAYED_RELEASE_TABLET | Freq: Two times a day (BID) | ORAL | Status: DC

## 2015-04-11 NOTE — Progress Notes (Signed)
Post Partum Day #2, s/p SVD  Subjective: no complaints, up ad lib, voiding and tolerating PO  Objective: Blood pressure 131/79, pulse 89, temperature 98.7 F (37.1 C), temperature source Oral, resp. rate 18, height 5\' 7"  (1.702 m), weight 185 lb (83.915 kg), SpO2 98 %, unknown if currently breastfeeding.  Physical Exam:  General: alert and no distress  Lungs: clear to auscultation bilaterally Breasts: normal appearance, no masses or tenderness Heart: regular rate and rhythm, S1, S2 normal, no murmur, click, rub or gallop Pelvis: Lochia: appropriate, Uterine Fundus: firm Extremities: DVT Evaluation: Negative Homan's sign. No cords or calf tenderness. No significant calf/ankle edema.   Recent Labs  04/09/15 0909 04/10/15 0530  HGB 10.9* 9.4*  HCT 31.9* 28.2*    Assessment/Plan: Discharge home, Breastfeeding and Contraception OCPs (combined) Anemia, asymptomatic, can continue to treat with PO iron daily.    LOS: 2 days   Hildred LaserAnika Dacota Ruben [facility]

## 2015-04-11 NOTE — Progress Notes (Signed)
Pt discharged home with infant.  Discharge instructions and follow up appointment given to and reviewed with pt.  Pt verbalized understanding.  Escorted by auxillary. 

## 2015-04-11 NOTE — Discharge Instructions (Signed)

## 2015-04-11 NOTE — Discharge Summary (Addendum)
Obstetric Discharge Summary Reason for Admission: induction of labor, mono-di twin gestation Prenatal Procedures: NST and ultrasound Intrapartum Procedures: spontaneous vaginal delivery Postpartum Procedures: none Complications-Operative and Postpartum: none HEMOGLOBIN  Date Value Ref Range Status  04/10/2015 9.4* 12.0 - 16.0 g/dL Final   HGB  Date Value Ref Range Status  10/01/2012 13.9 12.0-16.0 g/dL Final   HCT  Date Value Ref Range Status  04/10/2015 28.2* 35.0 - 47.0 % Final  10/01/2012 40.8 35.0-47.0 % Final   HEMATOCRIT  Date Value Ref Range Status  02/09/2015 29.6* 34.0 - 46.6 % Final    Physical Exam:  General: alert and no distress Lochia: appropriate Uterine Fundus: firm Incision: None DVT Evaluation: Negative Homan's sign. No cords or calf tenderness. No significant calf/ankle edema.  Discharge Diagnoses: Preterm delivery of mono-di twin gestation, anemia of pregnancy  Discharge Information: Date: 04/11/2015 Activity: pelvic rest Diet: routine Medications: PNV, Ibuprofen, Colace and Iron Condition: stable Instructions: refer to practice specific booklet Discharge to: home   Follow-up Information    Follow up with Hildred LaserAnika Janda Cargo, MD In 6 weeks.   Specialties:  Obstetrics and Gynecology, Radiology   Why:  Postpartum visit   Contact information:   [address] [place] [address] [phone]       Newborn Data:   Karlyne GreenspanReams, GirlA Dashanna [[id number]][[id number]]  Live born female  Birth Weight: 5 lb 5.2 oz (2415 g) APGAR: 9, 9   Hebert SohoReams, GirlB Grecia [[id number]][[id number]]  Live born female  Birth Weight: 6 lb 7.4 oz (2930 g) APGAR: 9, 9  Home with mother.  Hildred Lasernika Jafet Wissing 04/11/2015, 1:14 PM

## 2015-05-10 NOTE — Progress Notes (Signed)
This encounter was created in error - please disregard.

## 2015-05-11 ENCOUNTER — Ambulatory Visit (INDEPENDENT_AMBULATORY_CARE_PROVIDER_SITE_OTHER): Payer: Medicaid Other | Admitting: Obstetrics and Gynecology

## 2015-05-11 ENCOUNTER — Encounter: Payer: Self-pay | Admitting: Obstetrics and Gynecology

## 2015-05-11 VITALS — BP 127/88 | HR 86 | Ht 66.0 in | Wt 153.0 lb

## 2015-05-11 DIAGNOSIS — O9122 Nonpurulent mastitis associated with the puerperium: Secondary | ICD-10-CM

## 2015-05-11 MED ORDER — DICLOXACILLIN SODIUM 250 MG PO CAPS: 250.0000 mg | ORAL_CAPSULE | Freq: Four times a day (QID) | ORAL | Status: DC

## 2015-05-11 NOTE — Progress Notes (Signed)
    OBSTETRICS CLINIC PROGRESS NOTE  Subjective:    Patient ID: Ila McgillShayla D [place], female    DOB: October 02, 1995, 20 y.o.   MRN: [id number]  HPI  Patient is a 20 y.o. 192P1103 female who presents for complaints of right breast pain and redness for 2 days.  Patient notes that yesterday she noted streaking in left axillary region down toward the breast.  Today notes that streaking has resolved however notes a red rim around the breast.  Is beginning to notice slight redness of left breast as well today, but denies tenderness. Denies fevers and chills.  Is currently breast and bottle feeding.   The following portions of the patient's history were reviewed and updated as appropriate: allergies, current medications, past family history, past medical history, past social history, past surgical history and problem list.  Review of Systems Pertinent items noted in HPI and remainder of comprehensive ROS otherwise negative.   Objective:   Blood pressure 127/88, pulse 86, height 5\' 6"  (1.676 m), weight 153 lb (69.4 kg), currently breastfeeding. General appearance: alert and no distress Breasts: No nipple retraction or dimpling, Normal to palpation without dominant masses, positive findings: reddening of skin around areaola of right breast.  Left breast appears normal except small faint area of redness at 10 o'clock. Tenderness of right breast. Left breast non-tender.    Assessment:   Postpartum mastitis  Plan:   Patient with postpartum mastitis.  Discussed continuing breastfeeding regularly.  Will prescribe Dicloxacillin to take 4 x daily for 7 days.  Patient has final postpartum appointment on 05/20/15. Will f/u then.    Hildred LaserAnika Jeweline Reif, MD [facility]

## 2015-05-20 ENCOUNTER — Ambulatory Visit: Payer: Medicaid Other | Admitting: Obstetrics and Gynecology

## 2015-05-23 ENCOUNTER — Encounter (HOSPITAL_COMMUNITY): Payer: Self-pay | Admitting: *Deleted

## 2015-05-23 ENCOUNTER — Emergency Department (HOSPITAL_COMMUNITY)
Admission: EM | Admit: 2015-05-23 | Discharge: 2015-05-23 | Disposition: A | Discharge status: Home / Self Care | Payer: Medicaid Other | Attending: Emergency Medicine | Admitting: Emergency Medicine

## 2015-05-23 DIAGNOSIS — Z8619 Personal history of other infectious and parasitic diseases: Secondary | ICD-10-CM | POA: Diagnosis not present

## 2015-05-23 DIAGNOSIS — Z8742 Personal history of other diseases of the female genital tract: Secondary | ICD-10-CM | POA: Diagnosis not present

## 2015-05-23 DIAGNOSIS — Z79899 Other long term (current) drug therapy: Secondary | ICD-10-CM | POA: Diagnosis not present

## 2015-05-23 DIAGNOSIS — Z792 Long term (current) use of antibiotics: Secondary | ICD-10-CM | POA: Insufficient documentation

## 2015-05-23 DIAGNOSIS — Y998 Other external cause status: Secondary | ICD-10-CM | POA: Insufficient documentation

## 2015-05-23 DIAGNOSIS — Y9389 Activity, other specified: Secondary | ICD-10-CM | POA: Insufficient documentation

## 2015-05-23 DIAGNOSIS — S80211A Abrasion, right knee, initial encounter: Secondary | ICD-10-CM | POA: Insufficient documentation

## 2015-05-23 DIAGNOSIS — Z87891 Personal history of nicotine dependence: Secondary | ICD-10-CM | POA: Insufficient documentation

## 2015-05-23 DIAGNOSIS — Z8709 Personal history of other diseases of the respiratory system: Secondary | ICD-10-CM | POA: Insufficient documentation

## 2015-05-23 DIAGNOSIS — M25561 Pain in right knee: Secondary | ICD-10-CM

## 2015-05-23 DIAGNOSIS — Y92410 Unspecified street and highway as the place of occurrence of the external cause: Secondary | ICD-10-CM | POA: Diagnosis not present

## 2015-05-23 DIAGNOSIS — S8991XA Unspecified injury of right lower leg, initial encounter: Secondary | ICD-10-CM | POA: Diagnosis present

## 2015-05-23 MED ORDER — IBUPROFEN 600 MG PO TABS: 600.0000 mg | ORAL_TABLET | Freq: Four times a day (QID) | ORAL | Status: DC | PRN
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
## 2015-05-27 ENCOUNTER — Ambulatory Visit (INDEPENDENT_AMBULATORY_CARE_PROVIDER_SITE_OTHER): Payer: Medicaid Other | Admitting: Obstetrics and Gynecology

## 2015-05-27 ENCOUNTER — Encounter: Payer: Self-pay | Admitting: Obstetrics and Gynecology

## 2015-05-27 DIAGNOSIS — Z3009 Encounter for other general counseling and advice on contraception: Secondary | ICD-10-CM

## 2015-05-27 DIAGNOSIS — S8991XA Unspecified injury of right lower leg, initial encounter: Secondary | ICD-10-CM

## 2015-05-27 DIAGNOSIS — O9081 Anemia of the puerperium: Secondary | ICD-10-CM
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
## 2015-05-28 ENCOUNTER — Telehealth: Payer: Self-pay

## 2015-05-28 LAB — HEMOGLOBIN AND HEMATOCRIT, BLOOD
HEMATOCRIT: 40 % (ref 34.0–46.6)
Hemoglobin: 12.9 g/dL (ref 11.1–15.9)

## 2015-05-28 NOTE — Telephone Encounter (Signed)
-----   Message from Hildred LaserAnika Cherry, MD sent at 05/28/2015  9:19 AM EDT ----- Please inform patient that she is no longer anemic.  Hgb is normal.

## 2015-05-28 NOTE — Telephone Encounter (Signed)
Called pt informed her of information below.  

## 2015-06-16 ENCOUNTER — Ambulatory Visit (INDEPENDENT_AMBULATORY_CARE_PROVIDER_SITE_OTHER): Payer: Medicaid Other | Admitting: Obstetrics and Gynecology

## 2015-06-16 VITALS — BP 115/78 | HR 96 | Ht 66.0 in | Wt 149.0 lb

## 2015-06-16 DIAGNOSIS — Z30017 Encounter for initial prescription of implantable subdermal contraceptive: Secondary | ICD-10-CM

## 2015-06-16 DIAGNOSIS — Z30018 Encounter for initial prescription of other contraceptives: Secondary | ICD-10-CM

## 2015-06-16 DIAGNOSIS — R399 Unspecified symptoms and signs involving the genitourinary system: Secondary | ICD-10-CM

## 2015-06-16 LAB — POCT URINALYSIS DIPSTICK
Bilirubin, UA: NEGATIVE
Glucose, UA: NEGATIVE
Ketones, UA: NEGATIVE
Leukocytes, UA: NEGATIVE
NITRITE UA: NEGATIVE
PH UA: 6
Protein, UA: NEGATIVE
RBC UA: NEGATIVE
SPEC GRAV UA: 1.015
Urobilinogen, UA: NEGATIVE

## 2015-06-16 NOTE — Progress Notes (Signed)
    GYNECOLOGY CLINIC PROCEDURE NOTE  Colleen Lang is a 20 y.o. [phone] [id number] here for Nexplanon insertion. No previous pap history.  Patient has concerns about possible UTI, notes mild occasional dysuria.Marland Kitchen.  Nexplanon Insertion Procedure Patient identified, informed consent performed, consent signed.   Patient does understand that irregular bleeding is a very common side effect of this medication. She was advised to have backup contraception for one week after placement. Pregnancy test in clinic today was negative.  Appropriate time out taken.  Patient's left arm was prepped and draped in the usual sterile fashion. The ruler used to measure and mark insertion area.  Patient was prepped with alcohol swab and then injected with 3 ml of 1% lidocaine.  She was prepped with betadine, Nexplanon removed from packaging,  Device confirmed in needle, then inserted full length of needle and withdrawn per handbook instructions. Nexplanon was able to palpated in the patient's arm; patient palpated the insert herself. There was minimal blood loss.  Patient insertion site covered with guaze and a pressure bandage to reduce any bruising.  The patient tolerated the procedure well and was given post procedure instructions.  To f/u in 3 months for annual exam.    Results for orders placed or performed in visit on 06/16/15  Urine culture  Result Value Ref Range   Urine Culture, Routine Final report    Urine Culture result 1 No growth   POCT urinalysis dipstick  Result Value Ref Range   Color, UA yellow    Clarity, UA clear    Glucose, UA neg    Bilirubin, UA neg    Ketones, UA neg    Spec Grav, UA 1.015    Blood, UA neg    pH, UA 6.0    Protein, UA neg    Urobilinogen, UA negative    Nitrite, UA neg    Leukocytes, UA Negative Negative     Hildred LaserAnika Monty Spicher, MD [facility]

## 2015-06-18 LAB — URINE CULTURE: ORGANISM ID, BACTERIA: NO GROWTH

## 2015-08-11 ENCOUNTER — Telehealth: Payer: Self-pay | Admitting: Obstetrics and Gynecology

## 2015-08-11 NOTE — Telephone Encounter (Signed)
PT CALLED AND SHE IS STATING THAT SHE IS HAVING REALLY BAD HEADACHES AND BLURRED VISION,  SHE IS GOING TO THE EYE DOCTOR TO GET CHECKED BUT SHE WASN'T SURE IF THIS WAS A SIDE AFFECT FROM THE NEXPLANON, AND ALSO SHE IS HAVING REALLY HIGH ANXIETY FROM THE TWINS CRYING ALL DAY AND FEELS SHE MIGHT BE HAVING SOME POST PARTUM, SHE DIDN'T KNOW IF SHE NEEDED TO COME IN FOR ALL THIS OR IF SOMETHING COULD BE CALLED IN FOR THE ANXIETY.

## 2015-08-11 NOTE — Telephone Encounter (Signed)
Called pt she states that she has been having really bad headaches and is unsure if this is related to her Nexplanon. Advised pt that this is not typically a Nexplanon related side effect. Pt states that she going to proceed with going to eye doctor. Advised to follow up with PCP if no improvement from symptoms. Pt also states she believes she is experiencing some postpartum depression, advised pt to schedule appt. Pt to come tomorrow at 1:45pm.

## 2015-08-12 ENCOUNTER — Encounter: Payer: Self-pay | Admitting: Obstetrics and Gynecology

## 2015-08-12 ENCOUNTER — Ambulatory Visit (INDEPENDENT_AMBULATORY_CARE_PROVIDER_SITE_OTHER): Payer: Medicaid Other | Admitting: Obstetrics and Gynecology

## 2015-08-12 VITALS — BP 113/74 | HR 90 | Ht 67.0 in | Wt 151.6 lb

## 2015-08-12 DIAGNOSIS — F53 Postpartum depression: Principal | ICD-10-CM

## 2015-08-12 DIAGNOSIS — O99345 Other mental disorders complicating the puerperium: Secondary | ICD-10-CM

## 2015-08-12 MED ORDER — SERTRALINE HCL 50 MG PO TABS: 50.0000 mg | ORAL_TABLET | Freq: Every day | ORAL | 2 refills | Status: DC

## 2015-08-12 NOTE — Progress Notes (Signed)
    GYNECOLOGY PROGRESS NOTE  Subjective:    Colleen Lang ID: Colleen Lang, female    DOB: [birth year], 20 y.o.   MRN: [id number]  HPI  Colleen Lang is a 20 y.o. G28P1103 female who presents for evaluation for postpartum depression.   symptoms include labile mood changes, tearfulness, occasional anhedonia.   Colleen Lang notes that she does still get up to take care of her twins however it is very difficult. Reports that FOB works third shift, and then sleeps during the day, leaving her to deal with both infants all day. Colleen Lang does note that she has some other family support at home however support is limited. PHQ-9 score is 6  The following portions of the Colleen Lang's history were reviewed and updated as appropriate: allergies, current medications, past family history, past medical history, past social history, past surgical history and problem list.  Review of Systems Pertinent items noted in HPI and remainder of comprehensive ROS otherwise negative.   Objective:   Blood pressure 113/74, pulse 90, height 5\' 7"  (1.702 m), weight 151 lb 9.6 oz (68.8 kg), last menstrual period 07/13/2015, currently breastfeeding. General appearance: alert and no distress Psychological: normal affect, does appear sad, tearful.  Normal appearance. Denies SI/HI   Assessment:   Postpartum depression (mild)  Plan:   Colleen Lang was symptoms of mild postpartum depression. Based on pH she U-9 questionnaire Colleen Lang would likely benefit from psychotherapy with or without medications. Discussion had on management options with Colleen Lang. Colleen Lang knows that she does not feel comfortable initiating psychotherapy at this time (notes that she has difficulties opening up to strange people). Discussed option of initiation of medication. Colleen Lang is okay with plan. We'll start on Zoloft 50 mg. Discussed that this is a safe medications to take while breast-feeding. Colleen Lang will follow up in 6 weeks to reassess symptoms. Will also be due for an  annual exam at that time. Discussed that she may not notice a change in symptoms for the first 2 weeks however should notice a response thereafter. Encouraged to follow up sooner if symptoms worsen.   A total of 15 minutes were spent face-to-face with the Colleen Lang during this encounter and over half of that time dealt with counseling and coordination of care.   Hildred Laser, MD [facility]

## 2015-08-15 NOTE — Patient Instructions (Signed)
Postpartum Depression and Baby Blues °The postpartum period begins right after the birth of a baby. During this time, there is often a great amount of joy and excitement. It is also a time of many changes in the life of the parents. Regardless of how many times a mother gives birth, each child brings new challenges and dynamics to the family. It is not unusual to have feelings of excitement along with confusing shifts in moods, emotions, and thoughts. All mothers are at risk of developing postpartum depression or the "baby blues." These mood changes can occur right after giving birth, or they may occur many months after giving birth. The baby blues or postpartum depression can be mild or severe. Additionally, postpartum depression can go away rather quickly, or it can be a long-term condition.  °CAUSES °Raised hormone levels and the rapid drop in those levels are thought to be a main cause of postpartum depression and the baby blues. A number of hormones change during and after pregnancy. Estrogen and progesterone usually decrease right after the delivery of your baby. The levels of thyroid hormone and various cortisol steroids also rapidly drop. Other factors that play a role in these mood changes include major life events and genetics.  °RISK FACTORS °If you have any of the following risks for the baby blues or postpartum depression, know what symptoms to watch out for during the postpartum period. Risk factors that may increase the likelihood of getting the baby blues or postpartum depression include: °· Having a personal or family history of depression.   °· Having depression while being pregnant.   °· Having premenstrual mood issues or mood issues related to oral contraceptives. °· Having a lot of life stress.   °· Having marital conflict.   °· Lacking a social support network.   °· Having a baby with special needs.   °· Having health problems, such as diabetes.   °SIGNS AND SYMPTOMS °Symptoms of baby blues  include: °· Brief changes in mood, such as going from extreme happiness to sadness. °· Decreased concentration.   °· Difficulty sleeping.   °· Crying spells, tearfulness.   °· Irritability.   °· Anxiety.   °Symptoms of postpartum depression typically begin within the first month after giving birth. These symptoms include: °· Difficulty sleeping or excessive sleepiness.   °· Marked weight loss.   °· Agitation.   °· Feelings of worthlessness.   °· Lack of interest in activity or food.   °Postpartum psychosis is a very serious condition and can be dangerous. Fortunately, it is rare. Displaying any of the following symptoms is cause for immediate medical attention. Symptoms of postpartum psychosis include:  °· Hallucinations and delusions.   °· Bizarre or disorganized behavior.   °· Confusion or disorientation.   °DIAGNOSIS  °A diagnosis is made by an evaluation of your symptoms. There are no medical or lab tests that lead to a diagnosis, but there are various questionnaires that a health care provider may use to identify those with the baby blues, postpartum depression, or psychosis. Often, a screening tool called the Edinburgh Postnatal Depression Scale is used to diagnose depression in the postpartum period.  °TREATMENT °The baby blues usually goes away on its own in 1-2 weeks. Social support is often all that is needed. You will be encouraged to get adequate sleep and rest. Occasionally, you may be given medicines to help you sleep.  °Postpartum depression requires treatment because it can last several months or longer if it is not treated. Treatment may include individual or group therapy, medicine, or both to address any social, physiological, and psychological   factors that may play a role in the depression. Regular exercise, a healthy diet, rest, and social support may also be strongly recommended.  °Postpartum psychosis is more serious and needs treatment right away. Hospitalization is often needed. °HOME CARE  INSTRUCTIONS °· Get as much rest as you can. Nap when the baby sleeps.   °· Exercise regularly. Some women find yoga and walking to be beneficial.   °· Eat a balanced and nourishing diet.   °· Do little things that you enjoy. Have a cup of tea, take a bubble bath, read your favorite magazine, or listen to your favorite music. °· Avoid alcohol.   °· Ask for help with household chores, cooking, grocery shopping, or running errands as needed. Do not try to do everything.   °· Talk to people close to you about how you are feeling. Get support from your partner, family members, friends, or other new moms. °· Try to stay positive in how you think. Think about the things you are grateful for.   °· Do not spend a lot of time alone.   °· Only take over-the-counter or prescription medicine as directed by your health care provider. °· Keep all your postpartum appointments.   °· Let your health care provider know if you have any concerns.   °SEEK MEDICAL CARE IF: °You are having a reaction to or problems with your medicine. °SEEK IMMEDIATE MEDICAL CARE IF: °· You have suicidal feelings.   °· You think you may harm the baby or someone else. °MAKE SURE YOU: °· Understand these instructions. °· Will watch your condition. °· Will get help right away if you are not doing well or get worse. °  °This information is not intended to replace advice given to you by your health care provider. Make sure you discuss any questions you have with your health care provider. °  °Document Released: 09/30/2003 Document Revised: 12/31/2012 Document Reviewed: 10/07/2012 °Elsevier Interactive Patient Education ©2016 [employer] ° °

## 2015-09-16 ENCOUNTER — Encounter: Payer: Medicaid Other | Admitting: Obstetrics and Gynecology

## 2015-11-10 ENCOUNTER — Ambulatory Visit (INDEPENDENT_AMBULATORY_CARE_PROVIDER_SITE_OTHER): Payer: Medicaid Other | Admitting: Obstetrics and Gynecology

## 2015-11-10 ENCOUNTER — Encounter: Payer: Self-pay | Admitting: Obstetrics and Gynecology

## 2015-11-10 VITALS — BP 123/82 | HR 96 | Ht 67.0 in | Wt 155.0 lb

## 2015-11-10 DIAGNOSIS — Z3009 Encounter for other general counseling and advice on contraception: Secondary | ICD-10-CM

## 2015-11-10 DIAGNOSIS — Z113 Encounter for screening for infections with a predominantly sexual mode of transmission: Secondary | ICD-10-CM

## 2015-11-10 DIAGNOSIS — Z01419 Encounter for gynecological examination (general) (routine) without abnormal findings: Secondary | ICD-10-CM

## 2015-11-10 DIAGNOSIS — N912 Amenorrhea, unspecified: Secondary | ICD-10-CM

## 2015-11-10 DIAGNOSIS — R21 Rash and other nonspecific skin eruption: Secondary | ICD-10-CM

## 2015-11-10 NOTE — Progress Notes (Signed)
GYNECOLOGY ANNUAL PHYSICAL EXAM PROGRESS NOTE  Subjective:    Colleen Lang is a 20 y.o. 832P1103 female who presents for an annual exam. The patient is sexually active. The patient wears seatbelts: yes. The patient participates in regular exercise: no. Has the patient ever been transfused or tattooed?: no. The patient reports that there is not domestic violence in her life.    The patient has the following complaints today:  1.  Patient complains of itchy area on the vagina on right labia, also noting some internal vaginal irritation. Of note patient states that she recently found out about her husband's infidelity, and if it is believed that she may have an STD desires to be tested.  Gynecologic History No LMP recorded (lmp unknown).Patient currently still breastfeeding.  Menarche age: [age] Contraception: none History of STI's: Denies Last Pap:  10/2014. Results were: abnormal.  ASCUS, but HPV HR neg.  Denies h/o abnormal pap smears.   Obstetric History   G2   P2   T1   P1   A0   L3    SAB0   TAB0   Ectopic0   Multiple1   Live Births3     # Outcome Date GA Lbr Len/2nd Weight Sex Delivery Anes PTL Lv  2A Preterm 04/09/15 6651w2d / 00:22 5 lb 5.2 oz (2.415 kg) F Vag-Spont None  LIV     Name: Waldroup,GIRLA Elliette     Apgar1:  9                Apgar5: 9  2B Preterm 04/09/15 6151w2d / 00:30 6 lb 7.4 oz (2.93 kg) F Vag-Spont None  LIV     Name: Pichardo,GIRLB Leza     Apgar1:  9                Apgar5: 9  1 Term 2014   9 lb 1.6 oz (4.128 kg) M Vag-Spont   LIV      Past Medical History:  Diagnosis Date  . Abnormal uterine bleeding (AUB)    x 4 months  . AR (allergic rhinitis)   . Breast feeding status of mother    breast feeding 20 year old  . History of chlamydia infection 2014  . Ovarian cyst    seen on u/s 6 months ago- per pt they were small  . Pelvic pain in female     Past Surgical History:  Procedure Laterality Date  . KNEE SURGERY     x2  . TONSILLECTOMY    . WISDOM  TOOTH EXTRACTION      Family History  Problem Relation Age of Onset  . Diabetes Paternal Grandmother   . Diabetes Paternal Grandfather   . Cancer Neg Hx   . Ovarian cancer Neg Hx   . Breast cancer Neg Hx   . Heart disease Neg Hx     Social History   Social History  . Marital status: Single    Spouse name: N/A  . Number of children: N/A  . Years of education: N/A   Occupational History  . Not on file.   Social History Main Topics  . Smoking status: Current Some Day Smoker    Packs/day: 0.25    Types: Cigarettes  . Smokeless tobacco: Former NeurosurgeonUser  . Alcohol use No  . Drug use: No  . Sexual activity: Yes    Birth control/ protection: None, Implant   Other Topics Concern  . Not on file   Social History  Narrative  . No narrative on file    Current Outpatient Prescriptions on File Prior to Visit  Medication Sig Dispense Refill  . etonogestrel (NEXPLANON) 68 MG IMPL implant 1 each by Subdermal route once.     No current facility-administered medications on file prior to visit.     Allergies  Allergen Reactions  . Ultram [Tramadol Hcl] Anaphylaxis    Hard to breathe  . Other Rash    honey    Review of Systems Constitutional: negative for chills, fatigue, fevers and sweats Eyes: negative for irritation, redness and visual disturbance Ears, nose, mouth, throat, and face: negative for hearing loss, nasal congestion, snoring and tinnitus Respiratory: negative for asthma, cough, sputum Cardiovascular: negative for chest pain, dyspnea, exertional chest pressure/discomfort, irregular heart beat, palpitations and syncope Gastrointestinal: negative for abdominal pain, change in bowel habits, nausea and vomiting Genitourinary:  Positive for rash on right labia and vaginal itching.   Negative for abnormal menstrual periods, genital lesions, sexual problems and vaginal discharge, dysuria and urinary incontinence Integument/breast: negative for breast lump, breast tenderness  and nipple discharge Hematologic/lymphatic: negative for bleeding and easy bruising Musculoskeletal:negative for back pain and muscle weakness Neurological: negative for dizziness, headaches, vertigo and weakness Endocrine: negative for diabetic symptoms including polydipsia, polyuria and skin dryness Allergic/Immunologic: negative for hay fever and urticaria       Objective:  Blood pressure 123/82, pulse 96, height 5\' 7"  (1.702 m), weight 155 lb (70.3 kg), currently breastfeeding. Body mass index is 24.28 kg/m.  General Appearance:    Alert, cooperative, no distress, appears stated age  Head:    Normocephalic, without obvious abnormality, atraumatic  Eyes:    PERRL, conjunctiva/corneas clear, EOM's intact, both eyes  Ears:    Normal external ear canals, both ears  Nose:   Nares normal, septum midline, mucosa normal, no drainage or sinus tenderness  Throat:   Lips, mucosa, and tongue normal; teeth and gums normal  Neck:   Supple, symmetrical, trachea midline, no adenopathy; thyroid: no enlargement/tenderness/nodules; no carotid bruit or JVD  Back:     Symmetric, no curvature, ROM normal, no CVA tenderness  Lungs:     Clear to auscultation bilaterally, respirations unlabored  Chest Wall:    No tenderness or deformity   Heart:    Regular rate and rhythm, S1 and S2 normal, no murmur, rub or gallop  Breast Exam:    No tenderness, masses, or nipple abnormality  Abdomen:     Soft, non-tender, bowel sounds active all four quadrants, no masses, no organomegaly.    Genitalia:    Pelvic:external genitalia with small 2 x 2 erythematous region along right labia, no lesions or vesicles visible.  Sparse hair noted on vagina (evidence of recently shaving).  Vagina without lesions.  Moderate white thin discharge present without odor.  Rectovaginal septum  normal. Cervix normal in appearance, no cervical motion tenderness, no adnexal masses or tenderness.  Uterus normal size, shape, mobile, regular  contours, nontender.  Rectal:    Normal external sphincter.  No hemorrhoids appreciated. Internal exam not done.   Extremities:   Extremities normal, atraumatic, no cyanosis or edema  Pulses:   2+ and symmetric all extremities  Skin:   Skin color, texture, turgor normal, no rashes or lesions  Lymph nodes:   Cervical, supraclavicular, and axillary nodes normal  Neurologic:   CNII-XII intact, normal strength, sensation and reflexes throughout   .  Labs:  Lab Results  Component Value Date   WBC 12.1 (H)  04/10/2015   HGB 9.4 (L) 04/10/2015   HCT 40.0 05/27/2015   MCV 90.1 04/10/2015   PLT 152 04/10/2015    Lab Results  Component Value Date   CREATININE 0.58 12/06/2014   BUN 9 12/06/2014   NA 136 12/06/2014   K 3.8 12/06/2014   CL 105 12/06/2014   CO2 25 12/06/2014    Lab Results  Component Value Date   ALT 11 (L) 12/06/2014   AST 14 (L) 12/06/2014   ALKPHOS 43 12/06/2014   BILITOT 0.2 (L) 12/06/2014    Lab Results  Component Value Date   TSH 3.118 02/11/2015     Assessment:    Healthy female exam.   Possible exposure to STD Amenorrhea (secondary to breastfeeding) Contraception Vulvar rash with irritaiton  Plan:     Blood tests: Basic metabolic panel and Comprehensive metabolic panel. Breast self exam technique reviewed and patient encouraged to perform self-exam monthly. Contraception: discussed contraception options with patient, does not want to take anything that will affect her breast milk.  Encouraged condom use, especially in light of her partner's recent infidelity. . Discussed healthy lifestyle modifications. Pap smear up to date. Is ASCUS but HR HPV neg.  Can continue routine screening of q 3-5 years.  STD testing desired.  Patient declines STD serology (HIV, syphylis, Hepatitis panel), only desires vaginal screen.  Nuswab performed. Vaginal rash possibly irritation due to recent shaving, does not appear to be evidence of STD, however will still screen  as noted above.  Discussed findings with patient.  Can place aloe or OTC hydrocortisone cream to area.  Lactational amenorrhea normal, patient still breastfeeding twins.  Discussed that patient can still become pregnant during postpartum period even if she does not have a menstrual cycle.  Declines flu vaccine.  Discussed HPV vaccine.  Patient desires to think about it.  Follow up in 1 year for annual exam.    Hildred Laser, MD [facility]

## 2015-11-12 LAB — CBC
HEMATOCRIT: 40.5 % (ref 34.0–46.6)
Hemoglobin: 13.9 g/dL (ref 11.1–15.9)
MCH: 32.3 pg (ref 26.6–33.0)
MCHC: 34.3 g/dL (ref 31.5–35.7)
MCV: 94 fL (ref 79–97)
PLATELETS: 270 10*3/uL (ref 150–379)
RBC: 4.3 x10E6/uL (ref 3.77–5.28)
RDW: 12.5 % (ref 12.3–15.4)
WBC: 7.7 10*3/uL (ref 3.4–10.8)

## 2015-11-12 LAB — BASIC METABOLIC PANEL
BUN / CREAT RATIO: 21 (ref 9–23)
BUN: 17 mg/dL (ref 6–20)
CALCIUM: 9.8 mg/dL (ref 8.7–10.2)
CHLORIDE: 100 mmol/L (ref 96–106)
CO2: 23 mmol/L (ref 18–29)
Creatinine, Ser: 0.82 mg/dL (ref 0.57–1.00)
GFR, EST AFRICAN AMERICAN: 117 mL/min/{1.73_m2} (ref >59)
GFR, EST NON AFRICAN AMERICAN: 102 mL/min/{1.73_m2} (ref >59)
Glucose: 67 mg/dL (ref 65–99)
POTASSIUM: 3.6 mmol/L (ref 3.5–5.2)
SODIUM: 143 mmol/L (ref 134–144)

## 2015-11-14 LAB — NUSWAB VAGINITIS PLUS (VG+)
CANDIDA ALBICANS, NAA: NEGATIVE
CANDIDA GLABRATA, NAA: NEGATIVE
CHLAMYDIA TRACHOMATIS, NAA: NEGATIVE
NEISSERIA GONORRHOEAE, NAA: NEGATIVE
Trich vag by NAA: NEGATIVE

## 2015-11-23 LAB — HSV(HERPES SMPLX)ABS-I+II(IGG+IGM)-BLD
HSV 1 GLYCOPROTEIN G AB, IGG: 37.7 {index} — AB (ref 0.00–0.90)
HSV 2 Glycoprotein G Ab, IgG: <0.91 index (ref 0.00–0.90)

## 2015-11-23 LAB — SPECIMEN STATUS REPORT

## 2016-07-19 ENCOUNTER — Ambulatory Visit (INDEPENDENT_AMBULATORY_CARE_PROVIDER_SITE_OTHER): Payer: Medicaid Other | Admitting: Obstetrics and Gynecology

## 2016-07-19 ENCOUNTER — Encounter: Payer: Self-pay | Admitting: Obstetrics and Gynecology

## 2016-07-19 VITALS — BP 120/80 | HR 95 | Ht 67.0 in | Wt 145.6 lb

## 2016-07-19 DIAGNOSIS — R350 Frequency of micturition: Secondary | ICD-10-CM

## 2016-07-19 DIAGNOSIS — R102 Pelvic and perineal pain: Secondary | ICD-10-CM

## 2016-07-19 DIAGNOSIS — N930 Postcoital and contact bleeding: Secondary | ICD-10-CM

## 2016-07-19 DIAGNOSIS — N941 Unspecified dyspareunia: Secondary | ICD-10-CM | POA: Diagnosis not present

## 2016-07-19 DIAGNOSIS — Z8742 Personal history of other diseases of the female genital tract: Secondary | ICD-10-CM | POA: Diagnosis not present

## 2016-07-19 LAB — POCT URINALYSIS DIPSTICK
BILIRUBIN UA: NEGATIVE
GLUCOSE UA: NEGATIVE
Ketones, UA: NEGATIVE
LEUKOCYTES UA: NEGATIVE
Nitrite, UA: NEGATIVE
PROTEIN UA: NEGATIVE
SPEC GRAV UA: 1.01 (ref 1.010–1.025)
Urobilinogen, UA: 0.2 E.U./dL
pH, UA: 6 (ref 5.0–8.0)

## 2016-07-19 NOTE — Progress Notes (Signed)
    GYNECOLOGY PROGRESS NOTE  Subjective:    Patient ID: Colleen Lang, female    DOB: [birth year], 21 y.o.   MRN: [id number]  HPI  Patient is a 21 y.o. 172P1103 female who presents for complaints of dysuria, occasional postcoital bleeding, abdominal cramping not associated with menses, and LLQ discomfort.  This has been ongoing for the past several weeks.  Notes that intercourse is also uncomfortable.Has occasional episodes of nausea.  Denies hematuria, abnormal vaginal discharge. Currently using Nexplanon for contraception.  Unsure LMP as patient usually does not have a menstrual cycle.  Does occasionally have spotting. Patient does have a h/o ovarian cysts in the past.    The following portions of the patient's history were reviewed and updated as appropriate: allergies, current medications, past family history, past medical history, past social history, past surgical history and problem list.  Review of Systems Pertinent items noted in HPI and remainder of comprehensive ROS otherwise negative.   Objective:   Blood pressure 120/80, pulse 95, height 5\' 7"  (1.702 m), weight 145 lb 9.6 oz (66 kg), not currently breastfeeding.   General appearance: alert and no distress Abdomen: normal findings: bowel sounds normal, no masses palpable and soft and abnormal findings:  mild tenderness in midline and LLQ Pelvic: external genitalia normal, rectovaginal septum normal.  Vagina with brown discharge.  Cervix normal appearing, no lesions and mild CMT (tenderness of cervical os with Q-tip).  Uterus mobile, nontender, normal shape and size.  Adnexae non-palpable, mild right sided fullness.  Nont-ender bilaterally.  Extremities: extremities normal, atraumatic, no cyanosis or edema Neurologic: Grossly normal   Labs:  Results for orders placed or performed in visit on 07/19/16  POCT urinalysis dipstick  Result Value Ref Range   Color, UA yellow    Clarity, UA clear    Glucose, UA neg    Bilirubin, UA  neg    Ketones, UA neg    Spec Grav, UA 1.010 1.010 - 1.025   Blood, UA SMALL    pH, UA 6.0 5.0 - 8.0   Protein, UA neg    Urobilinogen, UA 0.2 0.2 or 1.0 E.U./dL   Nitrite, UA neg    Leukocytes, UA Negative Negative     Assessment:   UTI symptoms Postcoital bleeding Pelvic pain  H/o ovarian cyst Dyspareunia  Plan:   - Patient with UTI symptoms but normal UA.  Will send culture, advised on increasing hydration. Azo or cranberry juice.  - Postcoital bleeding and dyspareunia, no evidence of cervical lesions or polyps, +CMT noted.  Will perform Nuswab.  - Pelvic discomfort, mostly left-sided.  Patient with h/o ovarian cyst in the past. Will order pelvic ultrasound.  - Will notify patient by phone of all results.    Hildred Laserherry, Nikkole Placzek, MD [facility]

## 2016-07-21 LAB — URINE CULTURE

## 2016-07-24 ENCOUNTER — Ambulatory Visit (INDEPENDENT_AMBULATORY_CARE_PROVIDER_SITE_OTHER): Payer: Medicaid Other

## 2016-07-24 DIAGNOSIS — Z8742 Personal history of other diseases of the female genital tract: Secondary | ICD-10-CM

## 2016-07-26 LAB — NUSWAB VAGINITIS PLUS (VG+)
Candida albicans, NAA: NEGATIVE
Candida glabrata, NAA: NEGATIVE
Chlamydia trachomatis, NAA: NEGATIVE
Neisseria gonorrhoeae, NAA: NEGATIVE
Trich vag by NAA: NEGATIVE

## 2016-11-14 ENCOUNTER — Encounter: Payer: Medicaid Other | Admitting: Obstetrics and Gynecology

## 2017-03-27 ENCOUNTER — Emergency Department
Admission: EM | Admit: 2017-03-27 | Discharge: 2017-03-27 | Disposition: A | Discharge status: Home / Self Care | Payer: Self-pay | Attending: Emergency Medicine | Admitting: Emergency Medicine

## 2017-03-27 ENCOUNTER — Other Ambulatory Visit: Payer: Self-pay

## 2017-03-27 ENCOUNTER — Encounter: Payer: Self-pay | Admitting: Emergency Medicine

## 2017-03-27 ENCOUNTER — Emergency Department: Payer: Self-pay

## 2017-03-27 DIAGNOSIS — N83202 Unspecified ovarian cyst, left side: Secondary | ICD-10-CM | POA: Insufficient documentation

## 2017-03-27 DIAGNOSIS — R8271 Bacteriuria: Secondary | ICD-10-CM | POA: Insufficient documentation

## 2017-03-27 DIAGNOSIS — R102 Pelvic and perineal pain: Secondary | ICD-10-CM | POA: Insufficient documentation

## 2017-03-27 DIAGNOSIS — R52 Pain, unspecified: Secondary | ICD-10-CM

## 2017-03-27 DIAGNOSIS — F17210 Nicotine dependence, cigarettes, uncomplicated: Secondary | ICD-10-CM | POA: Insufficient documentation

## 2017-03-27 DIAGNOSIS — N938 Other specified abnormal uterine and vaginal bleeding: Secondary | ICD-10-CM | POA: Insufficient documentation

## 2017-03-27 LAB — BASIC METABOLIC PANEL
ANION GAP: 8 (ref 5–15)
BUN: 14 mg/dL (ref 6–20)
CALCIUM: 9.5 mg/dL (ref 8.9–10.3)
CHLORIDE: 106 mmol/L (ref 101–111)
CO2: 24 mmol/L (ref 22–32)
Creatinine, Ser: 0.74 mg/dL (ref 0.44–1.00)
GFR calc non Af Amer: >60 mL/min (ref >60)
Glucose, Bld: 127 mg/dL — ABNORMAL HIGH (ref 65–99)
Potassium: 3.5 mmol/L (ref 3.5–5.1)
Sodium: 138 mmol/L (ref 135–145)

## 2017-03-27 LAB — URINALYSIS, COMPLETE (UACMP) WITH MICROSCOPIC
BILIRUBIN URINE: NEGATIVE
GLUCOSE, UA: NEGATIVE mg/dL
Ketones, ur: NEGATIVE mg/dL
Leukocytes, UA: NEGATIVE
NITRITE: NEGATIVE
Protein, ur: NEGATIVE mg/dL
SPECIFIC GRAVITY, URINE: 1.005 (ref 1.005–1.030)
pH: 5 (ref 5.0–8.0)

## 2017-03-27 LAB — CBC WITH DIFFERENTIAL/PLATELET
BASOS ABS: 0.1 10*3/uL (ref 0–0.1)
BASOS PCT: 1 %
Eosinophils Absolute: 0.1 10*3/uL (ref 0–0.7)
Eosinophils Relative: 1 %
HEMATOCRIT: 43 % (ref 35.0–47.0)
HEMOGLOBIN: 14.6 g/dL (ref 12.0–16.0)
Lymphocytes Relative: 34 %
Lymphs Abs: 2.3 10*3/uL (ref 1.0–3.6)
MCH: 32.3 pg (ref 26.0–34.0)
MCHC: 33.9 g/dL (ref 32.0–36.0)
MCV: 95.1 fL (ref 80.0–100.0)
Monocytes Absolute: 0.3 10*3/uL (ref 0.2–0.9)
Monocytes Relative: 5 %
NEUTROS ABS: 4 10*3/uL (ref 1.4–6.5)
NEUTROS PCT: 59 %
Platelets: 226 10*3/uL (ref 150–440)
RBC: 4.52 MIL/uL (ref 3.80–5.20)
RDW: 13 % (ref 11.5–14.5)
WBC: 6.8 10*3/uL (ref 3.6–11.0)

## 2017-03-27 LAB — WET PREP, GENITAL
CLUE CELLS WET PREP: NONE SEEN
SPERM: NONE SEEN
TRICH WET PREP: NONE SEEN
Yeast Wet Prep HPF POC: NONE SEEN

## 2017-03-27 LAB — CHLAMYDIA/NGC RT PCR (ARMC ONLY)
Chlamydia Tr: NOT DETECTED
N gonorrhoeae: NOT DETECTED

## 2017-03-27 LAB — HCG, QUANTITATIVE, PREGNANCY

## 2017-03-27 MED ORDER — KETOROLAC TROMETHAMINE 60 MG/2ML IM SOLN
60.0000 mg | Freq: Once | INTRAMUSCULAR | Status: AC
  Administered 2017-03-27: 60 mg via INTRAMUSCULAR
  Filled 2017-03-27: qty 2

## 2017-03-27 MED ORDER — CEPHALEXIN 500 MG PO CAPS
500.0000 mg | ORAL_CAPSULE | Freq: Once | ORAL | Status: AC
  Administered 2017-03-27: 500 mg via ORAL
  Filled 2017-03-27: qty 1

## 2017-03-27 MED ORDER — CEPHALEXIN 500 MG PO CAPS: 500.0000 mg | ORAL_CAPSULE | Freq: Four times a day (QID) | ORAL | 0 refills | Status: AC

## 2017-03-27 MED ORDER — IBUPROFEN 800 MG PO TABS: 800.0000 mg | ORAL_TABLET | Freq: Three times a day (TID) | ORAL | 0 refills | Status: DC | PRN

## 2017-03-27 NOTE — ED Provider Notes (Signed)
[facility] Emergency Department Provider Note  ____________________________________________  Time seen: Approximately 6:09 PM  I have reviewed the triage vital signs and the nursing notes.   HISTORY  Chief Complaint Vaginal Bleeding    HPI Elder CyphersShayla D Godbey is a 22 y.o. female nonpregnant female with Nexplanon, polycystic ovarian syndrome, history of vaginal polyp status post removal, history of chlamydia infection, presenting with abnormal vaginal bleeding and pelvic pain.  The patient reports that she gave birth to twins 2 years ago and at her 6-week postpartum check she had Nexplanon placed.  She occasionally has intermittent very light bleeding, but is now having abnormal bleeding.  2 months ago, she began to have almost daily bleeding with occasional resolution, pelvic pressure worse with urination, suprapubic discomfort and right pelvic pain.  The patient denies any fever or chills, hematuria, or change in vaginal discharge.  Past Medical History:  Diagnosis Date  . Abnormal uterine bleeding (AUB)    x 4 months  . AR (allergic rhinitis)   . Breast feeding status of mother    breast feeding 22 year old  . History of chlamydia infection 2014  . Ovarian cyst    seen on u/s 6 months ago- per pt they were small  . Pelvic pain in female     Patient Active Problem List   Diagnosis Date Noted  . Sinus tachycardia 01/12/2015  . Menometrorrhagia 08/19/2014  . History of ovarian cyst 08/19/2014    Past Surgical History:  Procedure Laterality Date  . KNEE SURGERY     x2  . TONSILLECTOMY    . WISDOM TOOTH EXTRACTION      Current Outpatient Rx  . Order #: [id number] Class: Print  . Order #: [id number] Class: Historical Med  . Order #: [id number] Class: Print    Allergies Ultram Marcia Brash[tramadol hcl] and Other  Family History  Problem Relation Age of Onset  . Diabetes Paternal Grandmother   . Diabetes Paternal Grandfather   . Cancer Neg Hx   . Ovarian cancer  Neg Hx   . Breast cancer Neg Hx   . Heart disease Neg Hx     Social History Social History   Tobacco Use  . Smoking status: Current Some Day Smoker    Packs/day: 0.25    Types: Cigarettes  . Smokeless tobacco: Former Engineer, waterUser  Substance Use Topics  . Alcohol use: No  . Drug use: No    Review of Systems Constitutional: No fever/chills. ENT:  No congestion or rhinorrhea. Cardiovascular: Denies chest pain. Denies palpitations. Respiratory: Denies shortness of breath.  No cough. Gastrointestinal: Positive suprapubic abdominal pain.  No nausea, no vomiting.  No diarrhea.  No constipation. Genitourinary: Negative for dysuria.  Positive for pelvic pressure, pain in the suprapubic area and right pelvis.  Positive almost daily vaginal bleeding for the past 2 months. Musculoskeletal: Negative for back pain. Skin: Negative for rash. Neurological: Negative for headaches. No focal numbness, tingling or weakness.     ____________________________________________   PHYSICAL EXAM:  VITAL SIGNS: ED Triage Vitals  Enc Vitals Group     BP 03/27/17 1414 132/88     Pulse Rate 03/27/17 1414 100     Resp 03/27/17 1414 18     Temp 03/27/17 1414 98.7 F (37.1 C)     Temp Source 03/27/17 1414 Oral     SpO2 03/27/17 1414 96 %     Weight 03/27/17 1414 150 lb (68 kg)     Height 03/27/17 1414 5\' 7"  (1.702 m)  Head Circumference --      Peak Flow --      Pain Score 03/27/17 1425 9     Pain Loc --      Pain Edu? --      Excl. in GC? --     Constitutional: Alert and oriented. Well appearing and in no acute distress. Answers questions appropriately. Eyes: Conjunctivae are normal.  EOMI. No scleral icterus. Head: Atraumatic. Nose: No congestion/rhinnorhea. Mouth/Throat: Mucous membranes are moist.  Neck: No stridor.  Supple.  JVD.  No meningismus. Cardiovascular: Normal rate, regular rhythm. No murmurs, rubs or gallops.  Respiratory: Normal respiratory effort.  No accessory muscle use or  retractions. Lungs CTAB.  No wheezes, rales or ronchi. Gastrointestinal: Soft, and nondistended.  Tender to palpation in the suprapubic region in the low right pelvis.  No guarding or rebound.  No peritoneal signs. Genitourinary: Normal-appearing external genitalia without lesions. Normal vaginal exam with scant Brown discharge, normal-appearing cervix, normal vaginal wall tissue. Bimanual exam is negative for CMT, positive for right adnexal tenderness to palpation, no palpable masses.  Musculoskeletal: No LE edema. Neurologic:  A&Ox3.  Speech is clear.  Face and smile are symmetric.  EOMI.  Moves all extremities well. Skin:  Skin is warm, dry and intact. No rash noted. Psychiatric: Depressed mood and affect.  ____________________________________________   LABS (all labs ordered are listed, but only abnormal results are displayed)  Labs Reviewed  WET PREP, GENITAL - Abnormal; Notable for the following components:      Result Value   WBC, Wet Prep HPF POC FEW (*)    All other components within normal limits  BASIC METABOLIC PANEL - Abnormal; Notable for the following components:   Glucose, Bld 127 (*)    All other components within normal limits  URINALYSIS, COMPLETE (UACMP) WITH MICROSCOPIC - Abnormal; Notable for the following components:   Color, Urine STRAW (*)    APPearance CLEAR (*)    Hgb urine dipstick MODERATE (*)    Bacteria, UA RARE (*)    Squamous Epithelial / LPF 0-5 (*)    All other components within normal limits  CHLAMYDIA/NGC RT PCR ([facility] ONLY)  CBC WITH DIFFERENTIAL/PLATELET  HCG, QUANTITATIVE, PREGNANCY  POC URINE PREG, ED   ____________________________________________  EKG  Not indicated ____________________________________________  RADIOLOGY  US Pelvic Doppler (torsion R/o Or Mass Arterial Flow)  Result Date: 03/27/2017 CLINICAL DATA:  Pelvic pain for 2 months EXAM: TRANSABDOMINAL AND TRANSVAGINAL ULTRASOUND OF PELVIS DOPPLER ULTRASOUND OF  OVARIES TECHNIQUE: Both transabdominal and transvaginal ultrasound examinations of the pelvis were performed. Transabdominal technique was performed for global imaging of the pelvis including uterus, ovaries, adnexal regions, and pelvic cul-de-sac. It was necessary to proceed with endovaginal exam following the transabdominal exam to visualize the uterus endometrium and ovaries. Color and duplex Doppler ultrasound was utilized to evaluate blood flow to the ovaries. COMPARISON:  Ultrasound 07/24/2016 FINDINGS: Uterus Measurements: 9 x 3.9 x 5 cm.  No fibroids or other mass visualized. Endometrium Thickness: 3 mm.  No focal abnormality visualized. Right ovary Measurements: 4.6 x 2.6 x 3.6 cm. Slightly complicated cyst measuring 2.8 x 1.7 x 2.7 cm. Left ovary Measurements: 3.4 x 2.2 x 1.5 cm. Normal appearance/no adnexal mass. Pulsed Doppler evaluation of both ovaries demonstrates normal low-resistance arterial and venous waveforms. Other findings No abnormal free fluid. IMPRESSION: 1. Negative for ovarian torsion 2. Complex cyst in the left ovary measuring 2.8 cm, suggest 6-12 week sonographic follow-up to ensure resolution. Electronically Signed  By: Jasmine Pang M.D.   On: 03/27/2017 19:16   US Pelvic Complete With Transvaginal  Result Date: 03/27/2017 CLINICAL DATA:  Pelvic pain for 2 months EXAM: TRANSABDOMINAL AND TRANSVAGINAL ULTRASOUND OF PELVIS DOPPLER ULTRASOUND OF OVARIES TECHNIQUE: Both transabdominal and transvaginal ultrasound examinations of the pelvis were performed. Transabdominal technique was performed for global imaging of the pelvis including uterus, ovaries, adnexal regions, and pelvic cul-de-sac. It was necessary to proceed with endovaginal exam following the transabdominal exam to visualize the uterus endometrium and ovaries. Color and duplex Doppler ultrasound was utilized to evaluate blood flow to the ovaries. COMPARISON:  Ultrasound 07/24/2016 FINDINGS: Uterus Measurements: 9 x 3.9 x 5  cm.  No fibroids or other mass visualized. Endometrium Thickness: 3 mm.  No focal abnormality visualized. Right ovary Measurements: 4.6 x 2.6 x 3.6 cm. Slightly complicated cyst measuring 2.8 x 1.7 x 2.7 cm. Left ovary Measurements: 3.4 x 2.2 x 1.5 cm. Normal appearance/no adnexal mass. Pulsed Doppler evaluation of both ovaries demonstrates normal low-resistance arterial and venous waveforms. Other findings No abnormal free fluid. IMPRESSION: 1. Negative for ovarian torsion 2. Complex cyst in the left ovary measuring 2.8 cm, suggest 6-12 week sonographic follow-up to ensure resolution. Electronically Signed   By: Jasmine Pang M.D.   On: 03/27/2017 19:16    ____________________________________________   PROCEDURES  Procedure(s) performed: None  Procedures  Critical Care performed: No ____________________________________________   INITIAL IMPRESSION / ASSESSMENT AND PLAN / ED COURSE  Pertinent labs & imaging results that were available during my care of the patient were reviewed by me and considered in my medical decision making (see chart for details).  22 y.o. female with a history of polycystic ovarian syndrome, Nexplanon, presenting with 2 months of vaginal bleeding and lower pelvic cramping.  Overall, the patient is hemodynamically stable.  Her hemoglobin and hematocrit are reassuring and she is not anemic and does not require transfusion.  She is not having any symptoms of significant hypovolemia or anemia.  The patient has no evidence of infection; she has no significant vaginal discharge and her white blood cell count is normal, she is afebrile.  We will get an ultrasound to evaluate for ovarian cyst, consider ovarian torsion although this pain has been chronic and ongoing for 2 months so this is less likely.  Consider endometriosis, uterine fibroid.  The patient may also have dysfunctional uterine bleeding related or unrelated to her Nexplanon.  She Artie has an appointment with her  gynecologist, Dr. Valentino Saxon, in 6 days.  Symptomatic treatment has been ordered, and I will plan to reevaluate the patient for final disposition.  ____________________________________________  FINAL CLINICAL IMPRESSION(S) / ED DIAGNOSES  Final diagnoses:  Pain  Dysfunctional uterine bleeding  Left ovarian cyst  Bacteriuria         NEW MEDICATIONS STARTED DURING THIS VISIT:  New Prescriptions   CEPHALEXIN (KEFLEX) 500 MG CAPSULE    Take 1 capsule (500 mg total) by mouth 4 (four) times daily for 10 days.   IBUPROFEN (ADVIL,MOTRIN) 800 MG TABLET    Take 1 tablet (800 mg total) by mouth every 8 (eight) hours as needed for mild pain, moderate pain or cramping (with food).      Rockne Menghini, MD 03/27/17 2029

## 2017-03-27 NOTE — ED Triage Notes (Signed)
Pt reports that she has been having vaginal bleeding for the last two months. She has had an Implanad in. She feels like she is having pressure in her perineal. She reports that she has a GYN appt on Monday but can not wait until then

## 2017-03-27 NOTE — ED Notes (Signed)
Pt ambulatory to POV without difficulty with mother. VSS. NAD. Discharge instructions, RX and discharge discussed. All questions answered.

## 2017-03-27 NOTE — Discharge Instructions (Signed)
You may take Tylenol or Motrin for your pain.  Please take the entire course of antibiotics for the bacteria in your urine and let Dr. Valentino Saxonherry know if this helps your symptoms.  Please keep your appointment with Dr. Valentino Saxonherry on Monday.  Return to the emergency department if you develop severe pain, lightheadedness or fainting, nausea or vomiting, fever, or any other symptoms concerning to you.

## 2017-03-28 LAB — POCT PREGNANCY, URINE: PREG TEST UR: NEGATIVE

## 2017-04-02 ENCOUNTER — Ambulatory Visit (INDEPENDENT_AMBULATORY_CARE_PROVIDER_SITE_OTHER): Payer: Medicaid Other | Admitting: Obstetrics and Gynecology

## 2017-04-02 ENCOUNTER — Encounter: Payer: Self-pay | Admitting: Obstetrics and Gynecology

## 2017-04-02 VITALS — BP 114/74 | HR 91 | Ht 67.0 in | Wt 163.1 lb

## 2017-04-02 DIAGNOSIS — Z975 Presence of (intrauterine) contraceptive device: Principal | ICD-10-CM

## 2017-04-02 DIAGNOSIS — Z978 Presence of other specified devices: Secondary | ICD-10-CM

## 2017-04-02 DIAGNOSIS — R102 Pelvic and perineal pain: Secondary | ICD-10-CM

## 2017-04-02 DIAGNOSIS — N83202 Unspecified ovarian cyst, left side: Secondary | ICD-10-CM

## 2017-04-02 DIAGNOSIS — Z3046 Encounter for surveillance of implantable subdermal contraceptive: Secondary | ICD-10-CM

## 2017-04-02 DIAGNOSIS — N921 Excessive and frequent menstruation with irregular cycle: Secondary | ICD-10-CM

## 2017-04-02 NOTE — Progress Notes (Signed)
GYNECOLOGY PROGRESS NOTE  Subjective:    Patient ID: Colleen Lang, female    DOB: 10/19/1995, 22 y.o.   MRN: [id number]  HPI  Patient is a 22 y.o. [id number] female who presents for complaints of irregular menses, cramping with nausea nausea and vomiting for the past 2 months.  She notes that she has been having a menstrual cycle ongoing for the past 2 months. She currently has a Nexplanon in place (inserted 06/2015). She was seen in the Emergency Room last week for similar complaints. She had an ultrasound which noted the presence of a complex 2.8 cm left ovarian cyst.  No other pathology was noted.   The following portions of the patient's history were reviewed and updated as appropriate: allergies, current medications, past family history, past medical history, past social history, past surgical history and problem list.  Review of Systems Pertinent items noted in HPI and remainder of comprehensive ROS otherwise negative.   Objective:   Blood pressure 114/74, pulse 91, height 5\' 7"  (1.702 m), weight 163 lb 1.6 oz (74 kg), not currently breastfeeding. General appearance: alert and no distress Abdomen: soft, non-tender; bowel sounds normal; no masses,  no organomegaly Pelvic: external genitalia normal, rectovaginal septum normal.  Vagina without discharge.  Cervix normal appearing, no lesions and no motion tenderness.  Uterus mobile, nontender, normal shape and size.  Adnexae non-palpable, nontender bilaterally.  Extremities: extremities normal, atraumatic, no cyanosis or edema Neurologic: Grossly normal   Labs:  Results for orders placed or performed during the hospital encounter of 03/27/17  Chlamydia/NGC rt PCR  Result Value Ref Range   Specimen source GC/Chlam ENDOCERVICAL    Chlamydia Tr NOT DETECTED NOT DETECTED   N gonorrhoeae NOT DETECTED NOT DETECTED  Wet prep, genital  Result Value Ref Range   Yeast Wet Prep HPF POC NONE SEEN NONE SEEN   Trich, Wet Prep NONE SEEN NONE  SEEN   Clue Cells Wet Prep HPF POC NONE SEEN NONE SEEN   WBC, Wet Prep HPF POC FEW (A) NONE SEEN   Sperm NONE SEEN   CBC with Differential  Result Value Ref Range   WBC 6.8 3.6 - 11.0 K/uL   RBC 4.52 3.80 - 5.20 MIL/uL   Hemoglobin 14.6 12.0 - 16.0 g/dL   HCT 78.2 95.6 - 21.3 %   MCV 95.1 80.0 - 100.0 fL   MCH 32.3 26.0 - 34.0 pg   MCHC 33.9 32.0 - 36.0 g/dL   RDW 08.6 57.8 - 46.9 %   Platelets 226 150 - 440 K/uL   Neutrophils Relative % 59 %   Neutro Abs 4.0 1.4 - 6.5 K/uL   Lymphocytes Relative 34 %   Lymphs Abs 2.3 1.0 - 3.6 K/uL   Monocytes Relative 5 %   Monocytes Absolute 0.3 0.2 - 0.9 K/uL   Eosinophils Relative 1 %   Eosinophils Absolute 0.1 0 - 0.7 K/uL   Basophils Relative 1 %   Basophils Absolute 0.1 0 - 0.1 K/uL  Basic metabolic panel  Result Value Ref Range   Sodium 138 135 - 145 mmol/L   Potassium 3.5 3.5 - 5.1 mmol/L   Chloride 106 101 - 111 mmol/L   CO2 24 22 - 32 mmol/L   Glucose, Bld 127 (H) 65 - 99 mg/dL   BUN 14 6 - 20 mg/dL   Creatinine, Ser 6.29 0.44 - 1.00 mg/dL   Calcium 9.5 8.9 - 52.8 mg/dL   GFR calc non Af Amer >60 >  60 mL/min   GFR calc Af Amer >60 >60 mL/min   Anion gap 8 5 - 15  hCG, quantitative, pregnancy  Result Value Ref Range   hCG, Beta Chain, Quant, S <1 <5 mIU/mL  Urinalysis, Complete w Microscopic  Result Value Ref Range   Color, Urine STRAW (A) YELLOW   APPearance CLEAR (A) CLEAR   Specific Gravity, Urine 1.005 1.005 - 1.030   pH 5.0 5.0 - 8.0   Glucose, UA NEGATIVE NEGATIVE mg/dL   Hgb urine dipstick MODERATE (A) NEGATIVE   Bilirubin Urine NEGATIVE NEGATIVE   Ketones, ur NEGATIVE NEGATIVE mg/dL   Protein, ur NEGATIVE NEGATIVE mg/dL   Nitrite NEGATIVE NEGATIVE   Leukocytes, UA NEGATIVE NEGATIVE   RBC / HPF 6-30 0 - 5 RBC/hpf   WBC, UA 0-5 0 - 5 WBC/hpf   Bacteria, UA RARE (A) NONE SEEN   Squamous Epithelial / LPF 0-5 (A) NONE SEEN  Pregnancy, urine POC  Result Value Ref Range   Preg Test, Ur NEGATIVE NEGATIVE     Imaging:  CLINICAL DATA:  Pelvic pain for 2 months  EXAM: TRANSABDOMINAL AND TRANSVAGINAL ULTRASOUND OF PELVIS  DOPPLER ULTRASOUND OF OVARIES  TECHNIQUE: Both transabdominal and transvaginal ultrasound examinations of the pelvis were performed. Transabdominal technique was performed for global imaging of the pelvis including uterus, ovaries, adnexal regions, and pelvic cul-de-sac.  It was necessary to proceed with endovaginal exam following the transabdominal exam to visualize the uterus endometrium and ovaries. Color and duplex Doppler ultrasound was utilized to evaluate blood flow to the ovaries.  COMPARISON:  Ultrasound 07/24/2016  FINDINGS: Uterus  Measurements: 9 x 3.9 x 5 cm.  No fibroids or other mass visualized.  Endometrium  Thickness: 3 mm.  No focal abnormality visualized.  Right ovary  Measurements: 4.6 x 2.6 x 3.6 cm. Slightly complicated cyst measuring 2.8 x 1.7 x 2.7 cm.  Left ovary  Measurements: 3.4 x 2.2 x 1.5 cm. Normal appearance/no adnexal mass.  Pulsed Doppler evaluation of both ovaries demonstrates normal low-resistance arterial and venous waveforms.  Other findings  No abnormal free fluid.  IMPRESSION: 1. Negative for ovarian torsion 2. Complex cyst in the left ovary measuring 2.8 cm, suggest 6-12 week sonographic follow-up to ensure resolution.   Assessment:   Irregular bleeding on Nexplanon Nausea/vomiting Pelvic cramping Left ovarian cyst  Plan:   1. Irregular bleeding on Nexplanon. Discussed options for continuing Nexplanon and adding estrogen therapy (either short trial of OCPs or estradiol), or removal of Nexplanon.  Patient desires removal.  See procedure note below.  She notes she desires to wait 1 month before trying a new birth control to allow her body to self-regulate. Is unsure of what she desires next, given handout on all contraceptive options.  Has tried Depo Provera in the past with same  issues of breakthrough bleeding. Advised on looking into a combined therapy (estrogen with progesterone).  2. Pelvic cramping. Advised on Tylenol/Motrin prn for pain. May likely discontinue once Nexplanon is removed.  3. Nausea/vomiting.  Patient with normal electrolytes.  Negative workup in the ER.  Will f/u after Nexplanon removed as may be hormonally related. If nausea/vomiting continues, may need referral to GI.  4. Ovarian cyst (left) - small but complex cyst noted on left ovary.  Not likely cause of any significant pain or bleeding.  Recommend f/u in 6-12 weeks with ultrasound, but [facility] and it is not covered.  Will f/u if symptoms significantly worsen, but otherwise, can  likely be treated with combined contraceptive.   5. F/u in 1 month to initiate a new form of contraception.  Advised on condoms until then.     GYNECOLOGY OFFICE PROCEDURE NOTE  Arwilda Georgia Guilbault is a 22 y.o. [id number] here for Nexplanon removal.  Last pap smear was on 10/2014 and was ASCUS HR HPV neg. See above note for concerns regarding Nexplanon.    Nexplanon Removal Patient identified, informed consent performed, consent signed.   Appropriate time out taken. Nexplanon site identified.  Area prepped in usual sterile fashon. One ml of 1% lidocaine was used to anesthetize the area at the distal end of the implant. A small stab incision was made right beside the implant on the distal portion.  The Nexplanon rod was grasped using hemostats and removed without difficulty.  There was minimal blood loss. There were no complications.  2 ml of 1% lidocaine was injected around the incision for post-procedure analgesia.  Steri-strips were applied over the small incision.  A pressure bandage was applied to reduce any bruising.  The patient tolerated the procedure well and was given post procedure instructions.      Hildred Laser, MD [facility]

## 2017-04-02 NOTE — Progress Notes (Signed)
Irregular cycle, cramping and n/v  x 2 months. Having cycle for 2 months straight.

## 2017-05-03 ENCOUNTER — Encounter: Payer: Medicaid Other | Admitting: Obstetrics and Gynecology

## 2017-07-11 DIAGNOSIS — Z3009 Encounter for other general counseling and advice on contraception: Secondary | ICD-10-CM | POA: Diagnosis not present

## 2017-07-11 DIAGNOSIS — Z0389 Encounter for observation for other suspected diseases and conditions ruled out: Secondary | ICD-10-CM | POA: Diagnosis not present

## 2017-07-11 DIAGNOSIS — Z1388 Encounter for screening for disorder due to exposure to contaminants: Secondary | ICD-10-CM | POA: Diagnosis not present

## 2017-09-25 ENCOUNTER — Other Ambulatory Visit: Payer: Self-pay

## 2017-09-25 ENCOUNTER — Encounter: Payer: Self-pay | Admitting: Emergency Medicine

## 2017-09-25 DIAGNOSIS — R102 Pelvic and perineal pain: Secondary | ICD-10-CM | POA: Diagnosis not present

## 2017-09-25 DIAGNOSIS — N939 Abnormal uterine and vaginal bleeding, unspecified: Secondary | ICD-10-CM | POA: Insufficient documentation

## 2017-09-25 DIAGNOSIS — Z79899 Other long term (current) drug therapy: Secondary | ICD-10-CM | POA: Insufficient documentation

## 2017-09-25 DIAGNOSIS — F17210 Nicotine dependence, cigarettes, uncomplicated: Secondary | ICD-10-CM | POA: Insufficient documentation

## 2017-09-25 LAB — CBC WITH DIFFERENTIAL/PLATELET
BASOS ABS: 0.1 10*3/uL (ref 0–0.1)
BASOS PCT: 1 %
Eosinophils Absolute: 0.1 10*3/uL (ref 0–0.7)
Eosinophils Relative: 2 %
HEMATOCRIT: 39.9 % (ref 35.0–47.0)
HEMOGLOBIN: 14.1 g/dL (ref 12.0–16.0)
Lymphocytes Relative: 34 %
Lymphs Abs: 2.4 10*3/uL (ref 1.0–3.6)
MCH: 34 pg (ref 26.0–34.0)
MCHC: 35.2 g/dL (ref 32.0–36.0)
MCV: 96.7 fL (ref 80.0–100.0)
Monocytes Absolute: 0.6 10*3/uL (ref 0.2–0.9)
Monocytes Relative: 8 %
NEUTROS ABS: 3.9 10*3/uL (ref 1.4–6.5)
NEUTROS PCT: 55 %
Platelets: 208 10*3/uL (ref 150–440)
RBC: 4.13 MIL/uL (ref 3.80–5.20)
RDW: 13.3 % (ref 11.5–14.5)
WBC: 7 10*3/uL (ref 3.6–11.0)

## 2017-09-25 LAB — URINALYSIS, COMPLETE (UACMP) WITH MICROSCOPIC
Bacteria, UA: NONE SEEN
Bilirubin Urine: NEGATIVE
Glucose, UA: NEGATIVE mg/dL
KETONES UR: NEGATIVE mg/dL
Leukocytes, UA: NEGATIVE
Nitrite: NEGATIVE
PROTEIN: NEGATIVE mg/dL
Specific Gravity, Urine: 1.003 — ABNORMAL LOW (ref 1.005–1.030)
pH: 6 (ref 5.0–8.0)

## 2017-09-25 LAB — COMPREHENSIVE METABOLIC PANEL
ALBUMIN: 4.6 g/dL (ref 3.5–5.0)
ALK PHOS: 40 U/L (ref 38–126)
ALT: 14 U/L (ref 0–44)
AST: 19 U/L (ref 15–41)
Anion gap: 7 (ref 5–15)
BILIRUBIN TOTAL: 0.8 mg/dL (ref 0.3–1.2)
BUN: 12 mg/dL (ref 6–20)
CO2: 26 mmol/L (ref 22–32)
CREATININE: 0.82 mg/dL (ref 0.44–1.00)
Calcium: 9.8 mg/dL (ref 8.9–10.3)
Chloride: 108 mmol/L (ref 98–111)
GFR calc Af Amer: >60 mL/min (ref >60)
GFR calc non Af Amer: >60 mL/min (ref >60)
GLUCOSE: 102 mg/dL — AB (ref 70–99)
POTASSIUM: 3.9 mmol/L (ref 3.5–5.1)
Sodium: 141 mmol/L (ref 135–145)
TOTAL PROTEIN: 7.2 g/dL (ref 6.5–8.1)

## 2017-09-25 LAB — HCG, QUANTITATIVE, PREGNANCY: hCG, Beta Chain, Quant, S: <1 m[IU]/mL (ref <5)

## 2017-09-25 LAB — POCT PREGNANCY, URINE: Preg Test, Ur: NEGATIVE

## 2017-09-25 LAB — ABO/RH: ABO/RH(D): A POS

## 2017-09-25 NOTE — ED Triage Notes (Addendum)
Patient ambulatory to triage with steady gait, without difficulty or distress noted; pt reports left lower abd pain accomp by vag bleeding, nonradiating; st hx of same and dx with ovarian cysts; st +home pregnancy test, (approx mo pregnant); vag bleed x 3 days wk ago which resolved; G3 P3(inc twin pregn), hx PCOS

## 2017-09-26 ENCOUNTER — Encounter: Payer: Self-pay | Admitting: Radiology

## 2017-09-26 ENCOUNTER — Emergency Department
Admission: EM | Admit: 2017-09-26 | Discharge: 2017-09-26 | Disposition: A | Discharge status: Home / Self Care | Payer: Medicaid Other | Attending: Student in an Organized Health Care Education/Training Program | Admitting: Student in an Organized Health Care Education/Training Program

## 2017-09-26 ENCOUNTER — Emergency Department: Payer: Medicaid Other

## 2017-09-26 DIAGNOSIS — R102 Pelvic and perineal pain: Secondary | ICD-10-CM | POA: Diagnosis not present

## 2017-09-26 DIAGNOSIS — N939 Abnormal uterine and vaginal bleeding, unspecified: Secondary | ICD-10-CM

## 2017-09-26 MED ORDER — IBUPROFEN 600 MG PO TABS
600.0000 mg | ORAL_TABLET | Freq: Once | ORAL | Status: AC
  Administered 2017-09-26: 600 mg via ORAL

## 2017-09-26 MED ORDER — IBUPROFEN 600 MG PO TABS
ORAL_TABLET | ORAL | Status: AC
  Filled 2017-09-26: qty 1

## 2017-09-26 NOTE — ED Provider Notes (Signed)
[facility] Emergency Department Provider Note    First MD Initiated Contact with Patient 09/26/17 [phone]     (approximate)  I have reviewed the triage vital signs and the nursing notes.   HISTORY  Chief Complaint Abdominal Pain    HPI Colleen Lang is a 22 y.o. female G3, P3 presents the ER for evaluation of vaginal bleeding and left pelvic pain.  Patient has a history of PCOS.  Last normal menstrual cycle was roughly a month and a half ago.  Did have small amount of spotting roughly 2 weeks ago.  Over 1 month ago she did have 3+ home pregnancy test that were weakly positive.  States that today started having crampy pelvic pain as well as vaginal bleeding.  No fainting spells.  Denies any history of bleeding disorder.  States the pain is mild.    Past Medical History:  Diagnosis Date  . Abnormal uterine bleeding (AUB)    x 4 months  . AR (allergic rhinitis)   . Breast feeding status of mother    breast feeding 44 year old  . History of chlamydia infection 2014  . Ovarian cyst    seen on u/s 6 months ago- per pt they were small  . Pelvic pain in female    Family History  Problem Relation Age of Onset  . Diabetes Paternal Grandmother   . Diabetes Paternal Grandfather   . Cancer Neg Hx   . Ovarian cancer Neg Hx   . Breast cancer Neg Hx   . Heart disease Neg Hx    Past Surgical History:  Procedure Laterality Date  . KNEE SURGERY     x2  . TONSILLECTOMY    . WISDOM TOOTH EXTRACTION     Patient Active Problem List   Diagnosis Date Noted  . Sinus tachycardia 01/12/2015  . Menometrorrhagia 08/19/2014  . History of ovarian cyst 08/19/2014      Prior to Admission medications   Medication Sig Start Date End Date Taking? Authorizing Provider  etonogestrel (NEXPLANON) 68 MG IMPL implant 1 each by Subdermal route once.    [provider]  ibuprofen (ADVIL,MOTRIN) 800 MG tablet Take 1 tablet (800 mg total) by mouth every 8 (eight) hours  as needed for mild pain, moderate pain or cramping (with food). 03/27/17   Rockne Menghini, MD    Allergies Ultram Marcia Brash hcl] and Other    Social History Social History   Tobacco Use  . Smoking status: Current Some Day Smoker    Packs/day: 0.25    Types: Cigarettes  . Smokeless tobacco: Former Engineer, water Use Topics  . Alcohol use: No  . Drug use: No    Review of Systems Patient denies headaches, rhinorrhea, blurry vision, numbness, shortness of breath, chest pain, edema, cough, abdominal pain, nausea, vomiting, diarrhea, dysuria, fevers, rashes or hallucinations unless otherwise stated above in HPI. ____________________________________________   PHYSICAL EXAM:  VITAL SIGNS: Vitals:   09/26/17 0224 09/26/17 0257  BP:  115/84  Pulse: 92 90  Resp:  16  Temp:    SpO2: 98% 100%    Constitutional: Alert and oriented.  Eyes: Conjunctivae are normal.  Head: Atraumatic. Nose: No congestion/rhinnorhea. Mouth/Throat: Mucous membranes are moist.   Neck: No stridor. Painless ROM.  Cardiovascular: Normal rate, regular rhythm. Grossly normal heart sounds.  Good peripheral circulation. Respiratory: Normal respiratory effort.  No retractions. Lungs CTAB. Gastrointestinal: Soft and nontender. No distention. No abdominal bruits. No CVA tenderness. Genitourinary: deferred  Musculoskeletal: No lower extremity tenderness nor edema.  No joint effusions. Neurologic:  Normal speech and language. No gross focal neurologic deficits are appreciated. No facial droop Skin:  Skin is warm, dry and intact. No rash noted. Psychiatric: Mood and affect are normal. Speech and behavior are normal.  ____________________________________________   LABS (all labs ordered are listed, but only abnormal results are displayed)  Results for orders placed or performed during the hospital encounter of 09/26/17 (from the past 24 hour(s))  Urinalysis, Complete w Microscopic     Status: Abnormal     Collection Time: 09/25/17  9:30 PM  Result Value Ref Range   Color, Urine STRAW (A) YELLOW   APPearance CLEAR (A) CLEAR   Specific Gravity, Urine 1.003 (L) 1.005 - 1.030   pH 6.0 5.0 - 8.0   Glucose, UA NEGATIVE NEGATIVE mg/dL   Hgb urine dipstick LARGE (A) NEGATIVE   Bilirubin Urine NEGATIVE NEGATIVE   Ketones, ur NEGATIVE NEGATIVE mg/dL   Protein, ur NEGATIVE NEGATIVE mg/dL   Nitrite NEGATIVE NEGATIVE   Leukocytes, UA NEGATIVE NEGATIVE   RBC / HPF 0-5 0 - 5 RBC/hpf   WBC, UA 0-5 0 - 5 WBC/hpf   Bacteria, UA NONE SEEN NONE SEEN   Squamous Epithelial / LPF 0-5 0 - 5   Mucus PRESENT   CBC with Differential     Status: None   Collection Time: 09/25/17  9:30 PM  Result Value Ref Range   WBC 7.0 3.6 - 11.0 K/uL   RBC 4.13 3.80 - 5.20 MIL/uL   Hemoglobin 14.1 12.0 - 16.0 g/dL   HCT 16.1 09.6 - 04.5 %   MCV 96.7 80.0 - 100.0 fL   MCH 34.0 26.0 - 34.0 pg   MCHC 35.2 32.0 - 36.0 g/dL   RDW 40.9 81.1 - 91.4 %   Platelets 208 150 - 440 K/uL   Neutrophils Relative % 55 %   Neutro Abs 3.9 1.4 - 6.5 K/uL   Lymphocytes Relative 34 %   Lymphs Abs 2.4 1.0 - 3.6 K/uL   Monocytes Relative 8 %   Monocytes Absolute 0.6 0.2 - 0.9 K/uL   Eosinophils Relative 2 %   Eosinophils Absolute 0.1 0 - 0.7 K/uL   Basophils Relative 1 %   Basophils Absolute 0.1 0 - 0.1 K/uL  Comprehensive metabolic panel     Status: Abnormal   Collection Time: 09/25/17  9:30 PM  Result Value Ref Range   Sodium 141 135 - 145 mmol/L   Potassium 3.9 3.5 - 5.1 mmol/L   Chloride 108 98 - 111 mmol/L   CO2 26 22 - 32 mmol/L   Glucose, Bld 102 (H) 70 - 99 mg/dL   BUN 12 6 - 20 mg/dL   Creatinine, Ser 7.82 0.44 - 1.00 mg/dL   Calcium 9.8 8.9 - 95.6 mg/dL   Total Protein 7.2 6.5 - 8.1 g/dL   Albumin 4.6 3.5 - 5.0 g/dL   AST 19 15 - 41 U/L   ALT 14 0 - 44 U/L   Alkaline Phosphatase 40 38 - 126 U/L   Total Bilirubin 0.8 0.3 - 1.2 mg/dL   GFR calc non Af Amer >60 >60 mL/min   GFR calc Af Amer >60 >60 mL/min   Anion  gap 7 5 - 15  hCG, quantitative, pregnancy     Status: None   Collection Time: 09/25/17  9:30 PM  Result Value Ref Range   hCG, Beta Chain, Quant, S <1 <5 mIU/mL  ABO/Rh     Status: None   Collection Time: 09/25/17  9:30 PM  Result Value Ref Range   ABO/RH(D)      A POS Performed at [facility], [address] [place], [place] [address]   Pregnancy, urine POC     Status: None   Collection Time: 09/25/17  9:45 PM  Result Value Ref Range   Preg Test, Ur NEGATIVE NEGATIVE   ____________________________________________ ____________________________________________  RADIOLOGY  I personally reviewed all radiographic images ordered to evaluate for the above acute complaints and reviewed radiology reports and findings.  These findings were personally discussed with the patient.  Please see medical record for radiology report.  ____________________________________________   PROCEDURES  Procedure(s) performed:  Procedures    Critical Care performed: no ____________________________________________   INITIAL IMPRESSION / ASSESSMENT AND PLAN / ED COURSE  Pertinent labs & imaging results that were available during my care of the patient were reviewed by me and considered in my medical decision making (see chart for details).   DDX: pcos, torsion, dub, anovulatory cycle, ectopic  Colleen Lang is a 22 y.o. who presents to the ED with vaginal bleeding and crampy left pelvic pain as described above.  Patient is AFVSS in ED. Exam as above. Given current presentation have considered the above differential.  Patient is not pregnant.  Blood work is reassuring.  Ultrasound be ordered given her history of PCOS to rule out torsion.  Clinical Course as of Sep 27 535  Wed Sep 26, 2017  0242 Patient reassessed.  Remains Heema dynamically stable.  Ultrasound shows no evidence of torsion cyst or abdominal pathology.  Again offered pelvic exam the patient deferring this to see her  OB/GYN which I think is reasonable as she is hemodynamically stable.  Also discussed options for managing her vaginal bleeding including Lysteda, high-dose Motrin and oral contraceptive pills.  Patient states that she would prefer to follow-up with her OB/GYN .  discussed signs and symptoms which the patient should seek medical evaluation.   [PR]    Clinical Course User Index [PR] Willy Eddyobinson, Taden Witter, MD     As part of my medical decision making, I reviewed the following data within the electronic MEDICAL RECORD NUMBER Nursing notes reviewed and incorporated, Labs reviewed, notes from prior ED visits.  ____________________________________________   FINAL CLINICAL IMPRESSION(S) / ED DIAGNOSES  Final diagnoses:  Pelvic pain  Episode of heavy vaginal bleeding      NEW MEDICATIONS STARTED DURING THIS VISIT:  Discharge Medication List as of 09/26/2017  2:45 AM       Note:  This document was prepared using Dragon voice recognition software and may include unintentional dictation errors.    Willy Eddyobinson, Grace Haggart, MD 09/26/17 [phone]

## 2017-09-28 ENCOUNTER — Encounter: Payer: Self-pay | Admitting: Obstetrics and Gynecology

## 2017-09-28 ENCOUNTER — Ambulatory Visit (INDEPENDENT_AMBULATORY_CARE_PROVIDER_SITE_OTHER): Payer: Medicaid Other | Admitting: Obstetrics and Gynecology

## 2017-09-28 VITALS — BP 122/79 | HR 93 | Ht 67.0 in | Wt 165.0 lb

## 2017-09-28 DIAGNOSIS — R102 Pelvic and perineal pain: Secondary | ICD-10-CM

## 2017-09-28 DIAGNOSIS — N939 Abnormal uterine and vaginal bleeding, unspecified: Secondary | ICD-10-CM | POA: Diagnosis not present

## 2017-09-28 NOTE — Patient Instructions (Signed)
Preparing for Pregnancy If you are considering becoming pregnant, make an appointment to see your regular health care provider to learn how to prepare for a safe and healthy pregnancy (preconception care). During a preconception care visit, your health care provider will:  Do a complete physical exam, including a Pap test.  Take a complete medical history.  Give you information, answer your questions, and help you resolve problems.  Preconception checklist Medical history  Tell your health care provider about any current or past medical conditions. Your pregnancy or your ability to become pregnant may be affected by chronic conditions, such as diabetes, chronic hypertension, and thyroid problems.  Include your family's medical history as well as your partner's medical history.  Tell your health care provider about any history of STIs (sexually transmitted infections).These can affect your pregnancy. In some cases, they can be passed to your baby. Discuss any concerns that you have about STIs.  If indicated, discuss the benefits of genetic testing. This testing will show whether there are any genetic conditions that may be passed from you or your partner to your baby.  Tell your health care provider about: ? Any problems you have had with conception or pregnancy. ? Any medicines you take. These include vitamins, herbal supplements, and over-the-counter medicines. ? Your history of immunizations. Discuss any vaccinations that you may need.  Diet  Ask your health care provider what to include in a healthy diet that has a balance of nutrients. This is especially important when you are pregnant or preparing to become pregnant.  Ask your health care provider to help you reach a healthy weight before pregnancy. ? If you are overweight, you may be at higher risk for certain complications, such as high blood pressure, diabetes, and preterm birth. ? If you are underweight, you are more likely  to have a baby who has a low birth weight.  Lifestyle, work, and home  Let your health care provider know: ? About any lifestyle habits that you have, such as alcohol use, drug use, or smoking. ? About recreational activities that may put you at risk during pregnancy, such as downhill skiing and certain exercise programs. ? Tell your health care provider about any international travel, especially any travel to places with an active Zika virus outbreak. ? About harmful substances that you may be exposed to at work or at home. These include chemicals, pesticides, radiation, or even litter boxes. ? If you do not feel safe at home.  Mental health  Tell your health care provider about: ? Any history of mental health conditions, including feelings of depression, sadness, or anxiety. ? Any medicines that you take for a mental health condition. These include herbs and supplements.  Home instructions to prepare for pregnancy Lifestyle  Eat a balanced diet. This includes fresh fruits and vegetables, whole grains, lean meats, low-fat dairy products, healthy fats, and foods that are high in fiber. Ask to meet with a nutritionist or registered dietitian for assistance with meal planning and goals.  Get regular exercise. Try to be active for at least 30 minutes a day on most days of the week. Ask your health care provider which activities are safe during pregnancy.  Do not use any products that contain nicotine or tobacco, such as cigarettes and e-cigarettes. If you need help quitting, ask your health care provider.  Do not drink alcohol.  Do not take illegal drugs.  Maintain a healthy weight. Ask your health care provider what weight range is   right for you.  General instructions  Keep an accurate record of your menstrual periods. This makes it easier for your health care provider to determine your baby's due date.  Begin taking prenatal vitamins and folic acid supplements daily as directed by  your health care provider.  Manage any chronic conditions, such as high blood pressure and diabetes, as told by your health care provider. This is important.  How do I know that I am pregnant? You may be pregnant if you have been sexually active and you miss your period. Symptoms of early pregnancy include:  Mild cramping.  Very light vaginal bleeding (spotting).  Feeling unusually tired.  Nausea and vomiting (morning sickness).  If you have any of these symptoms and you suspect that you might be pregnant, you can take a home pregnancy test. These tests check for a hormone in your urine (human chorionic gonadotropin, or hCG). A woman's body begins to make this hormone during early pregnancy. These tests are very accurate. Wait until at least the first day after you miss your period to take one. If the test shows that you are pregnant (you get a positive result), call your health care provider to make an appointment for prenatal care. What should I do if I become pregnant?  Make an appointment with your health care provider as soon as you suspect you are pregnant.  Do not use any products that contain nicotine, such as cigarettes, chewing tobacco, and e-cigarettes. If you need help quitting, ask your health care provider.  Do not drink alcoholic beverages. Alcohol is related to a number of birth defects.  Avoid toxic odors and chemicals.  You may continue to have sexual intercourse if it does not cause pain or other problems, such as vaginal bleeding. This information is not intended to replace advice given to you by your health care provider. Make sure you discuss any questions you have with your health care provider. Document Released: 12/09/2007 Document Revised: 08/24/2015 Document Reviewed: 07/18/2015 Elsevier Interactive Patient Education  2018 [employer]  

## 2017-09-28 NOTE — Progress Notes (Signed)
GYNECOLOGY PROGRESS NOTE  Subjective:    Patient ID: Colleen Lang, female    DOB: 05/10/1995, 22 y.o.   MRN: [id number]  HPI  Patient is a 22 y.o. [phone] female with a h/o PCOS who presents for f/u from recent Emergency Room visit (09/24/2017) for complaints of abnormal uterine bleeding and pelvic pain. At that time she complained of a heavier than normal amount of bleeding than her normal cycle.  She states that her last normal menstrual cycle was in July, and did not have a cycle in August (however did have ~ 3 days of spotting).  Reports 3+ home pregnancy tests that were faintly positive last month.  She had a negative pregnancy test (BHCG) in the ER. She currently denies any cramping.  Still noting some light bleeding on today. Denies nausea/vomiting, fevers, chills. She had a Nexplanon that was removed in March 2019.   Of note, patient states that she and her current partner are attempting to conceive. She is worried that she may be having difficulty becoming pregnant now that she has been diagnosed with PCOS, and was easily able to become pregnant in the past. She and her partner have been trying for 2 months.    The following portions of the patient's history were reviewed and updated as appropriate: allergies, current medications, past family history, past medical history, past social history, past surgical history and problem list.  Review of Systems Pertinent items noted in HPI and remainder of comprehensive ROS otherwise negative.   Objective:   Blood pressure 122/79, pulse 93, height 5\' 7"  (1.702 m), weight 165 lb (74.8 kg), last menstrual period 09/25/2017, unknown if currently breastfeeding. General appearance: alert and no distress Abdomen: normal findings: bowel sounds normal, no masses palpable, no organomegaly and soft and abnormal findings:  mild tenderness of left lower abdomen Pelvic: external genitalia normal, rectovaginal septum normal.  Vagina with small amount of  red-brown blood.  Cervix normal appearing, no lesions and no motion tenderness.  Uterus mobile, nontender, normal shape and size.  Adnexae non-palpable, nontender bilaterally.     Labs:  Results for orders placed or performed during the hospital encounter of 09/26/17  Urinalysis, Complete w Microscopic  Result Value Ref Range   Color, Urine STRAW (A) YELLOW   APPearance CLEAR (A) CLEAR   Specific Gravity, Urine 1.003 (L) 1.005 - 1.030   pH 6.0 5.0 - 8.0   Glucose, UA NEGATIVE NEGATIVE mg/dL   Hgb urine dipstick LARGE (A) NEGATIVE   Bilirubin Urine NEGATIVE NEGATIVE   Ketones, ur NEGATIVE NEGATIVE mg/dL   Protein, ur NEGATIVE NEGATIVE mg/dL   Nitrite NEGATIVE NEGATIVE   Leukocytes, UA NEGATIVE NEGATIVE   RBC / HPF 0-5 0 - 5 RBC/hpf   WBC, UA 0-5 0 - 5 WBC/hpf   Bacteria, UA NONE SEEN NONE SEEN   Squamous Epithelial / LPF 0-5 0 - 5   Mucus PRESENT   CBC with Differential  Result Value Ref Range   WBC 7.0 3.6 - 11.0 K/uL   RBC 4.13 3.80 - 5.20 MIL/uL   Hemoglobin 14.1 12.0 - 16.0 g/dL   HCT 14.7 82.9 - 56.2 %   MCV 96.7 80.0 - 100.0 fL   MCH 34.0 26.0 - 34.0 pg   MCHC 35.2 32.0 - 36.0 g/dL   RDW 13.0 86.5 - 78.4 %   Platelets 208 150 - 440 K/uL   Neutrophils Relative % 55 %   Neutro Abs 3.9 1.4 - 6.5 K/uL  Lymphocytes Relative 34 %   Lymphs Abs 2.4 1.0 - 3.6 K/uL   Monocytes Relative 8 %   Monocytes Absolute 0.6 0.2 - 0.9 K/uL   Eosinophils Relative 2 %   Eosinophils Absolute 0.1 0 - 0.7 K/uL   Basophils Relative 1 %   Basophils Absolute 0.1 0 - 0.1 K/uL  Comprehensive metabolic panel  Result Value Ref Range   Sodium 141 135 - 145 mmol/L   Potassium 3.9 3.5 - 5.1 mmol/L   Chloride 108 98 - 111 mmol/L   CO2 26 22 - 32 mmol/L   Glucose, Bld 102 (H) 70 - 99 mg/dL   BUN 12 6 - 20 mg/dL   Creatinine, Ser 4.090.82 0.44 - 1.00 mg/dL   Calcium 9.8 8.9 - 81.110.3 mg/dL   Total Protein 7.2 6.5 - 8.1 g/dL   Albumin 4.6 3.5 - 5.0 g/dL   AST 19 15 - 41 U/L   ALT 14 0 - 44 U/L    Alkaline Phosphatase 40 38 - 126 U/L   Total Bilirubin 0.8 0.3 - 1.2 mg/dL   GFR calc non Af Amer >60 >60 mL/min   GFR calc Af Amer >60 >60 mL/min   Anion gap 7 5 - 15  hCG, quantitative, pregnancy  Result Value Ref Range   hCG, Beta Chain, Quant, S <1 <5 mIU/mL  Pregnancy, urine POC  Result Value Ref Range   Preg Test, Ur NEGATIVE NEGATIVE  ABO/Rh  Result Value Ref Range   ABO/RH(D)      A POS Performed at [facility], [address] [place], [place] [address]      Imaging:  US PELVIC COMPLETE W TRANSVAGINAL AND TORSION R/O CLINICAL DATA:  Left pelvic pain x1 week. Negative urine pregnancy test.  EXAM: TRANSABDOMINAL AND TRANSVAGINAL ULTRASOUND OF PELVIS  DOPPLER ULTRASOUND OF OVARIES  TECHNIQUE: Both transabdominal and transvaginal ultrasound examinations of the pelvis were performed. Transabdominal technique was performed for global imaging of the pelvis including uterus, ovaries, adnexal regions, and pelvic cul-de-sac.  It was necessary to proceed with endovaginal exam following the transabdominal exam to visualize the endometrium and both ovaries. Color and duplex Doppler ultrasound was utilized to evaluate blood flow to the ovaries.  COMPARISON:  None.  FINDINGS: Uterus  Measurements: 9 x 5 x 5.3 cm.  No fibroids or other mass visualized.  Endometrium  Thickness: 5.7 mm.  No focal abnormality visualized.  Right ovary  Measurements: 3.6 x 2 x 2.4 cm.  Normal appearance/no adnexal mass.  Left ovary  Measurements: 3.7 x 2.2 x 2 cm.  Normal appearance/no adnexal mass.  Pulsed Doppler evaluation of both ovaries demonstrates normal low-resistance arterial and venous waveforms.  Other findings  Trace free fluid in the pelvis, likely physiologic.  IMPRESSION: No acute pelvic abnormality. Normal sized ovaries without evidence of torsion or mass.  Electronically Signed   By: Tollie Ethavid  Kwon M.D.   On: 09/26/2017 02:31   Assessment:    Abnormal uterine bleeding Pelvic pain PCOS  Plan:   - Reviewed patient's labs and ultrasound results from ER visit. No evidence of ovarian cysts, or pelvic abnormalities. Discussion had with patient regarding episode of abnormal uterine bleeding and pelvic pain/cramping.She is unsure if current episode is her menses, or if she may have had a miscarriage as she does report 3+ home UPTs.  Discussed that either scenario is possible, as patients can have irregular periods with PCOS, as well as her h/o + UPTs, however negative on ER visit (  may have had a chemical pregnancy).  Discussed options for management of PCOS and irregular bleeding episode, however patient declines at this time as she states that she and her partner are attempting to conceive.  This has been her first abnormal period since March when her Nexplanon was removed. She desires to do a "wait and see" approach.   - Advised on timed coitus, tracking menstrual cycles and ovulation, cessation of smoking for both partners, taking a PNV in order to prepare for potential pregnancy. Advised that infertility is usually assessed after 1 year of active trying.  Advised patient that she is with a new partner, and timing may be different with attempts at conception.  - To f/u as needed.    A total of 15 minutes were spent face-to-face with the patient during this encounter and over half of that time dealt with counseling and coordination of care.    Hildred Laser, MD [facility]

## 2017-09-28 NOTE — Progress Notes (Signed)
Pt stated that she has a lot of pain the abd and pelvic area. Pt stated that she had positive pregnancy test but about 2 weeks later she noticed a lot of bleeding with pain. Pain level 8.

## 2017-10-19 ENCOUNTER — Emergency Department
Admission: EM | Admit: 2017-10-19 | Discharge: 2017-10-19 | Disposition: A | Discharge status: Home / Self Care | Payer: Medicaid Other | Attending: Emergency Medicine | Admitting: Emergency Medicine

## 2017-10-19 ENCOUNTER — Other Ambulatory Visit: Payer: Self-pay

## 2017-10-19 ENCOUNTER — Emergency Department: Payer: Medicaid Other

## 2017-10-19 ENCOUNTER — Encounter: Payer: Self-pay | Admitting: Emergency Medicine

## 2017-10-19 DIAGNOSIS — S39012A Strain of muscle, fascia and tendon of lower back, initial encounter: Secondary | ICD-10-CM | POA: Diagnosis not present

## 2017-10-19 DIAGNOSIS — F17210 Nicotine dependence, cigarettes, uncomplicated: Secondary | ICD-10-CM | POA: Insufficient documentation

## 2017-10-19 DIAGNOSIS — Y999 Unspecified external cause status: Secondary | ICD-10-CM | POA: Diagnosis not present

## 2017-10-19 DIAGNOSIS — M546 Pain in thoracic spine: Secondary | ICD-10-CM | POA: Diagnosis not present

## 2017-10-19 DIAGNOSIS — M5416 Radiculopathy, lumbar region: Secondary | ICD-10-CM | POA: Diagnosis not present

## 2017-10-19 DIAGNOSIS — Y929 Unspecified place or not applicable: Secondary | ICD-10-CM | POA: Diagnosis not present

## 2017-10-19 DIAGNOSIS — Y939 Activity, unspecified: Secondary | ICD-10-CM | POA: Diagnosis not present

## 2017-10-19 DIAGNOSIS — M545 Low back pain: Secondary | ICD-10-CM | POA: Diagnosis not present

## 2017-10-19 DIAGNOSIS — S3992XA Unspecified injury of lower back, initial encounter: Secondary | ICD-10-CM | POA: Diagnosis present

## 2017-10-19 DIAGNOSIS — X58XXXA Exposure to other specified factors, initial encounter: Secondary | ICD-10-CM | POA: Insufficient documentation

## 2017-10-19 LAB — POC URINE PREG, ED: Preg Test, Ur: NEGATIVE

## 2017-10-19 MED ORDER — KETOROLAC TROMETHAMINE 30 MG/ML IJ SOLN
30.0000 mg | Freq: Once | INTRAMUSCULAR | Status: DC
  Filled 2017-10-19: qty 1

## 2017-10-19 MED ORDER — PREDNISONE 10 MG PO TABS: 10.0000 mg | ORAL_TABLET | Freq: Every day | ORAL | 0 refills | Status: DC

## 2017-10-19 MED ORDER — KETOROLAC TROMETHAMINE 30 MG/ML IJ SOLN
30.0000 mg | Freq: Once | INTRAMUSCULAR | Status: AC
  Administered 2017-10-19: 30 mg via INTRAMUSCULAR
  Filled 2017-10-19: qty 1

## 2017-10-19 MED ORDER — CYCLOBENZAPRINE HCL 5 MG PO TABS: 5.0000 mg | ORAL_TABLET | Freq: Three times a day (TID) | ORAL | 0 refills | Status: DC | PRN

## 2017-10-19 MED ORDER — ORPHENADRINE CITRATE 30 MG/ML IJ SOLN
60.0000 mg | Freq: Once | INTRAMUSCULAR | Status: AC
  Administered 2017-10-19: 60 mg via INTRAMUSCULAR
  Filled 2017-10-19: qty 2

## 2017-10-19 NOTE — ED Provider Notes (Signed)
[facility] EMERGENCY DEPARTMENT Provider Note   CSN: [id number] Arrival date & time: 10/19/17  1636     History   Chief Complaint Chief Complaint  Patient presents with  . Back Pain    HPI Colleen Lang is a 22 y.o. female presents to the emergency department for evaluation of midline thoracic back pain with pain in the lower lumbar spine as well as numbness tingling radicular symptoms going down both legs.  Her symptoms been present for a week.  She denies any trauma or injury.  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  She describes the pain in her lower back is tight.  She denies any fevers, chest pain, shortness of breath or abdominal pain.  She has taken some over-the-counter medications with mild relief.  She denies any urinary symptoms, vaginal bleeding or discharge.  HPI  Past Medical History:  Diagnosis Date  . Abnormal uterine bleeding (AUB)    x 4 months  . AR (allergic rhinitis)   . Breast feeding status of mother    breast feeding 71 year old  . History of chlamydia infection 2014  . Ovarian cyst    seen on u/s 6 months ago- per pt they were small  . Pelvic pain in female     Patient Active Problem List   Diagnosis Date Noted  . Sinus tachycardia 01/12/2015  . Menometrorrhagia 08/19/2014  . History of ovarian cyst 08/19/2014    Past Surgical History:  Procedure Laterality Date  . KNEE SURGERY     x2  . TONSILLECTOMY    . WISDOM TOOTH EXTRACTION       OB History    Gravida  3   Para  2   Term  1   Preterm  1   AB      Living  3     SAB      TAB      Ectopic      Multiple  1   Live Births  3            Home Medications    Prior to Admission medications   Medication Sig Start Date End Date Taking? Authorizing Provider  cyclobenzaprine (FLEXERIL) 5 MG tablet Take 1-2 tablets (5-10 mg total) by mouth 3 (three) times daily as needed for muscle spasms. 10/19/17   Evon Slack, PA-C    ibuprofen (ADVIL,MOTRIN) 200 MG tablet Take 200 mg by mouth every 6 (six) hours as needed.    [provider]  predniSONE (DELTASONE) 10 MG tablet Take 1 tablet (10 mg total) by mouth daily. 6,5,4,3,2,1 six day taper 10/19/17   Evon Slack, PA-C    Family History Family History  Problem Relation Age of Onset  . Diabetes Paternal Grandmother   . Diabetes Paternal Grandfather   . Thyroid disease Mother   . Healthy Father   . Cancer Neg Hx   . Ovarian cancer Neg Hx   . Breast cancer Neg Hx   . Heart disease Neg Hx     Social History Social History   Tobacco Use  . Smoking status: Current Some Day Smoker    Packs/day: 0.25    Types: Cigarettes  . Smokeless tobacco: Former Engineer, water Use Topics  . Alcohol use: Yes    Comment: occass  . Drug use: No     Allergies   Ultram [tramadol hcl] and Other   Review of Systems Review  of Systems  Constitutional: Negative for activity change.  Eyes: Negative for pain and visual disturbance.  Respiratory: Negative for shortness of breath.   Cardiovascular: Negative for chest pain and leg swelling.  Gastrointestinal: Negative for abdominal pain.  Genitourinary: Negative for flank pain and pelvic pain.  Musculoskeletal: Positive for back pain and myalgias. Negative for arthralgias, gait problem, joint swelling, neck pain and neck stiffness.  Skin: Negative for wound.  Neurological: Positive for numbness. Negative for dizziness, syncope, weakness, light-headedness and headaches.  Psychiatric/Behavioral: Negative for confusion and decreased concentration.     Physical Exam Updated Vital Signs BP 124/84 (BP Location: Right Arm)   Pulse 98   Temp 98.9 F (37.2 C) (Oral)   Resp 16   LMP 09/25/2017   SpO2 98%   Physical Exam  Constitutional: She is oriented to person, place, and time. She appears well-developed and well-nourished.  HENT:  Head: Normocephalic and atraumatic.  Eyes: Conjunctivae are normal.   Neck: Normal range of motion.  Cardiovascular: Normal rate.  Pulmonary/Chest: Effort normal. No respiratory distress.  Abdominal: Soft. She exhibits no distension. There is no tenderness. There is no guarding.  Musculoskeletal:  Lumbar Spine: Examination of the lumbar spine reveals no bony abnormality, no edema, and no ecchymosis.  There is no step off.  The patient has full range of motion of the lumbar spine with flexion and extension.  The patient has normal lateral bend and rotation.  Mild pain with lumbar flexion, extension.  The patient has a negative axial load test, and a negative rotational Waddell test.  Tender along the spinous process at the thoracolumbar junction.  Patient is tender along the paravertebral muscles of the lumbar spine along the lumbosacral junction.  Deep tendon reflexes are normal bilaterally..  The patient is non tender along the iliac crest.  The patient is non tender in the sciatic notch.  The patient is non tender along the Sacroiliac joint.  There is no Coccyx joint tenderness.    Bilateral Lower Extremities: Examination of the lower extremities reveals no bony abnormality, no edema, and no ecchymosis.  The patient has full active and passive range of motion of the hips, knees, and ankles.  There is no discomfort with range of motion exercises.  The patient is non tender along the greater trochanter region.  The patient has a negative Denna Haggard' test bilaterally.  There is normal skin warmth.  There is normal capillary refill bilaterally.    Neurologic: The patient has a negative straight leg raise.  The patient has normal muscle strength testing for the quadriceps, calves, ankle dorsiflexion, ankle plantarflexion, and extensor hallicus longus.  The patient has sensation that is intact to light touch.  Normal bilateral deep tendon reflexes.  Neurological: She is alert and oriented to person, place, and time.  Skin: Skin is warm. No rash noted.  Psychiatric: She has a  normal mood and affect. Her behavior is normal. Thought content normal.     ED Treatments / Results  Labs (all labs ordered are listed, but only abnormal results are displayed) Labs Reviewed  POC URINE PREG, ED    EKG None  Radiology Dg Thoracic Spine 2 View  Result Date: 10/19/2017 CLINICAL DATA:  Back pain.  No reported injury. EXAM: THORACIC SPINE 2 VIEWS COMPARISON:  None. FINDINGS: There is no evidence of thoracic spine fracture. Alignment is normal. No other significant bone abnormalities are identified. IMPRESSION: Negative. Electronically Signed   By: Elsie Stain M.D.   On: 10/19/2017  18:21   Dg Lumbar Spine 2-3 Views  Result Date: 10/19/2017 CLINICAL DATA:  Midline back pain with no reported injury. EXAM: LUMBAR SPINE - 2-3 VIEW COMPARISON:  None. FINDINGS: There is no evidence of lumbar spine fracture. Alignment is normal. Intervertebral disc spaces are maintained. IMPRESSION: Negative. Electronically Signed   By: Elsie Stain M.D.   On: 10/19/2017 18:20    Procedures Procedures (including critical care time)  Medications Ordered in ED Medications  orphenadrine (NORFLEX) injection 60 mg (60 mg Intramuscular Given 10/19/17 1724)  ketorolac (TORADOL) 30 MG/ML injection 30 mg (30 mg Intramuscular Given 10/19/17 1820)     Initial Impression / Assessment and Plan / ED Course  I have reviewed the triage vital signs and the nursing notes.  Pertinent labs & imaging results that were available during my care of the patient were reviewed by me and considered in my medical decision making (see chart for details).     22 year old female with lower back pain x1 week with some mild radicular symptoms.  No neurological deficits.  No fevers vital signs are stable.  Patient performs a lot of heavy lifting.  X-rays of thoracic and lumbar spine show no acute abnormality.  Patient is treated with prednisone, muscle relaxer.  She is given a note to remain out of work and heavy  lifting for 2 days.  She understands signs symptoms return to the ED for.  Final Clinical Impressions(s) / ED Diagnoses   Final diagnoses:  Strain of lumbar region, initial encounter  Lumbar radiculopathy    ED Discharge Orders         Ordered    cyclobenzaprine (FLEXERIL) 5 MG tablet  3 times daily PRN     10/19/17 1850    predniSONE (DELTASONE) 10 MG tablet  Daily     10/19/17 1850           Ronnette Juniper 10/19/17 1918    Dionne Bucy, MD 10/19/17 2351

## 2017-10-19 NOTE — Discharge Instructions (Addendum)
Please take medications as prescribed.  Return to emergency department for any fevers worsening symptoms or urgent changes in health.

## 2017-10-19 NOTE — ED Notes (Signed)
Pt attempting to urinate at this time. Pt aware urine sample is needed.

## 2017-10-19 NOTE — ED Triage Notes (Signed)
PT c/o mid and lowers back pain x1wk. Denies any acute injury or GU symptoms. Ambulatory

## 2017-11-24 IMAGING — US US MFM OB DETAIL+14 WK
2 series · 14 of 28 positions shown · non-contrast
Comparison: none

[Series 1: us mfm ob detail+14 wk · 0.26mm/px · 98 acquisitions, 13 frames shown (1 of 2)]
[im 4/98]
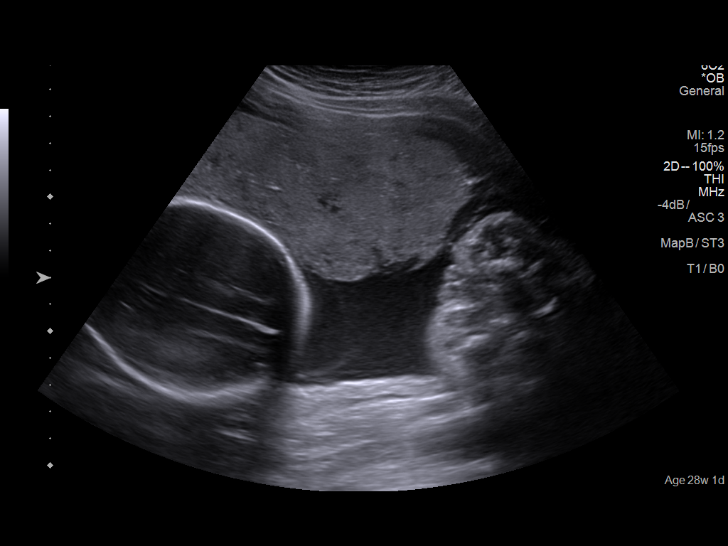
[im 12/98]
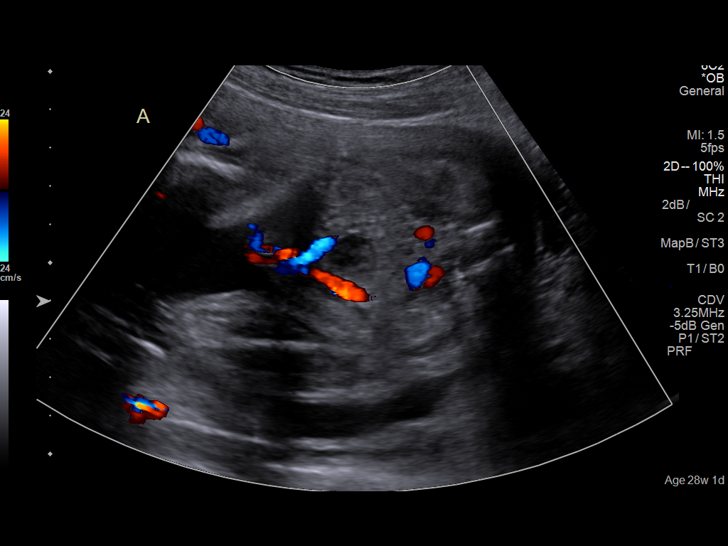
[im 19/98]
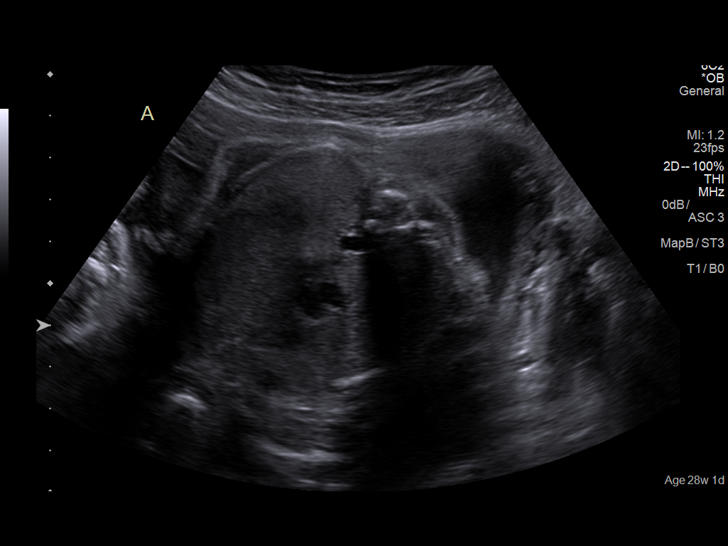
[im 27/98]
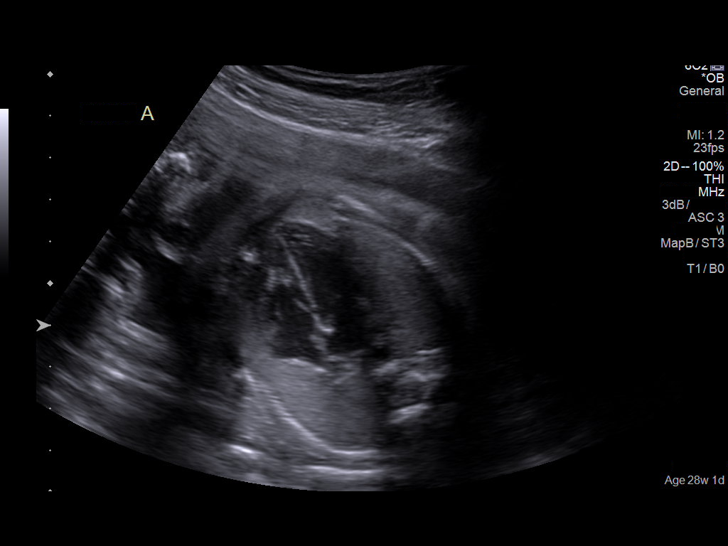
[im 34/98]
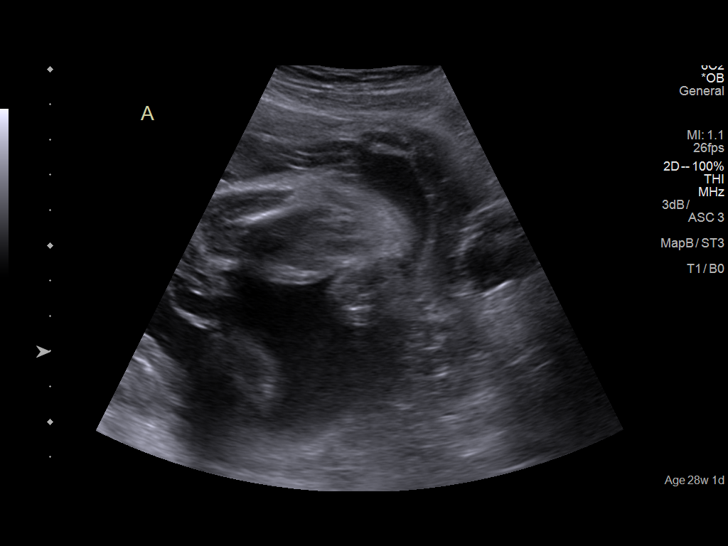
[im 42/98]
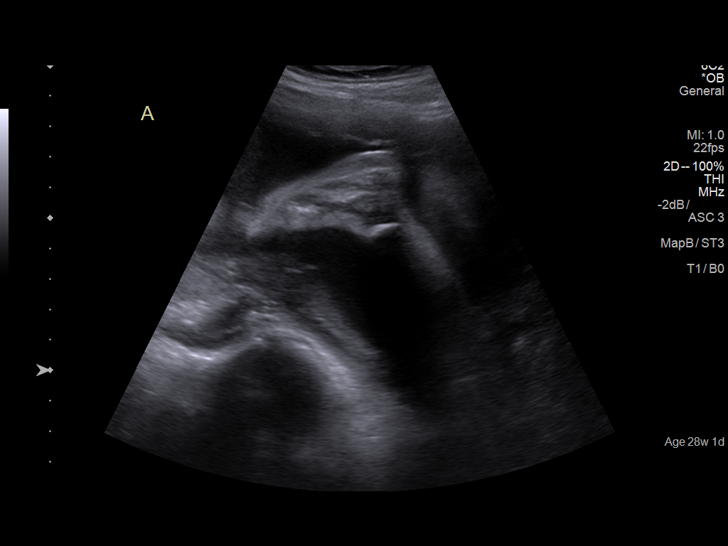
[im 49/98]
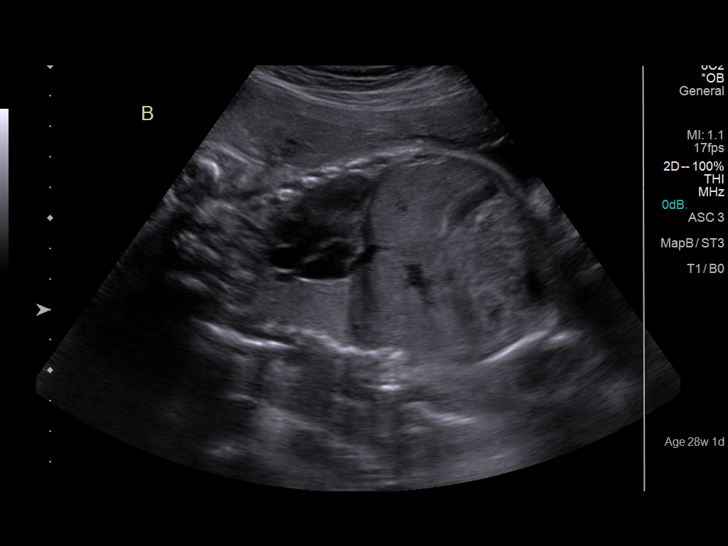
[im 56/98]
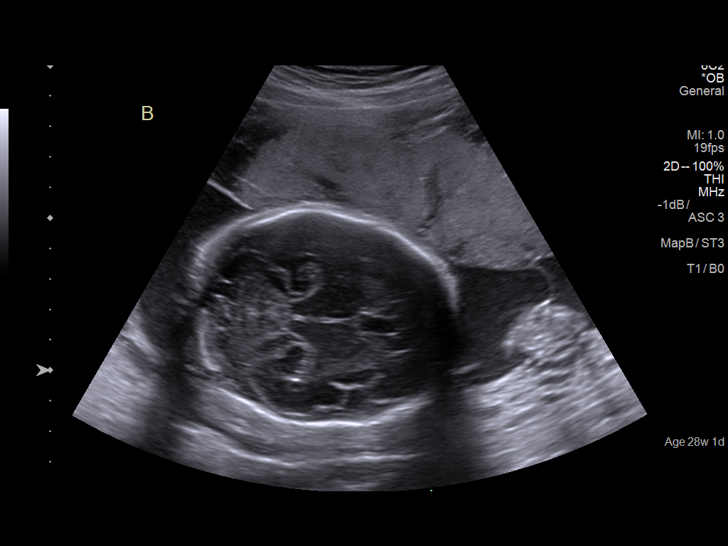
[im 64/98]
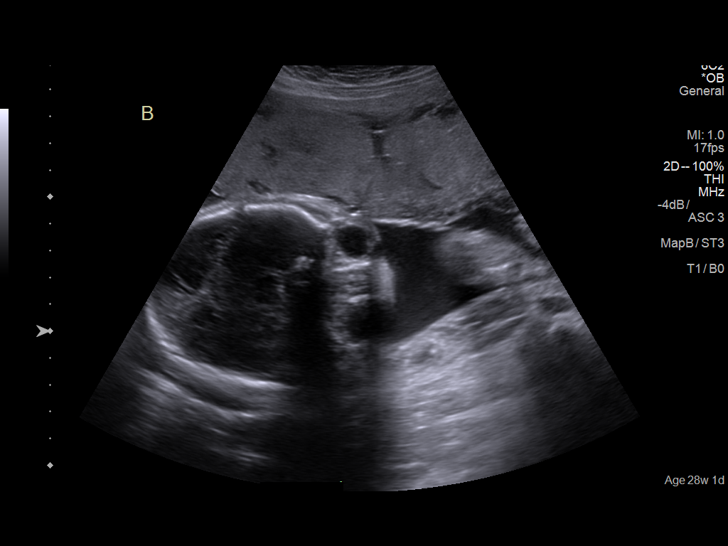
[im 71/98]
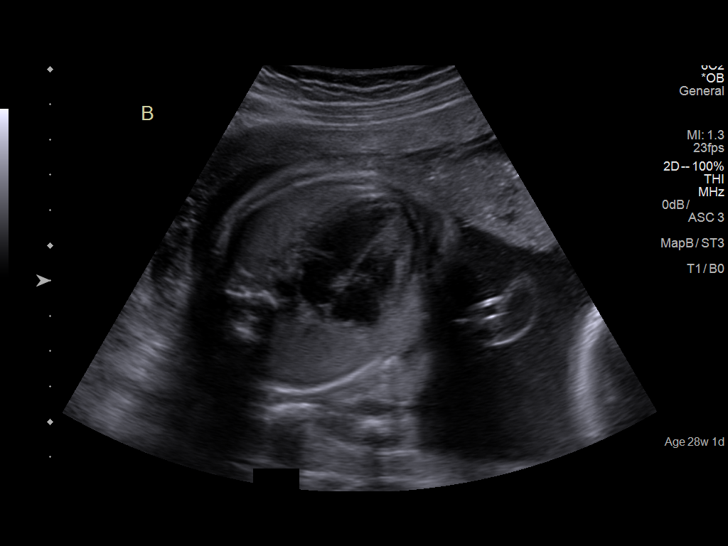
[im 79/98]
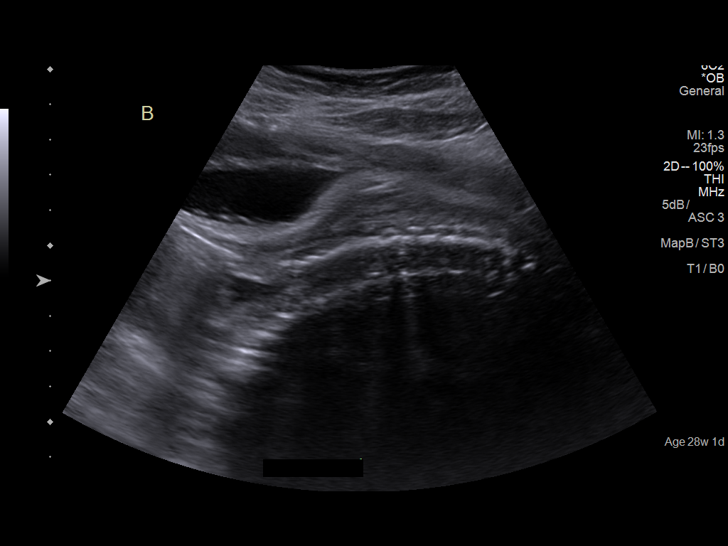
[im 86/98]
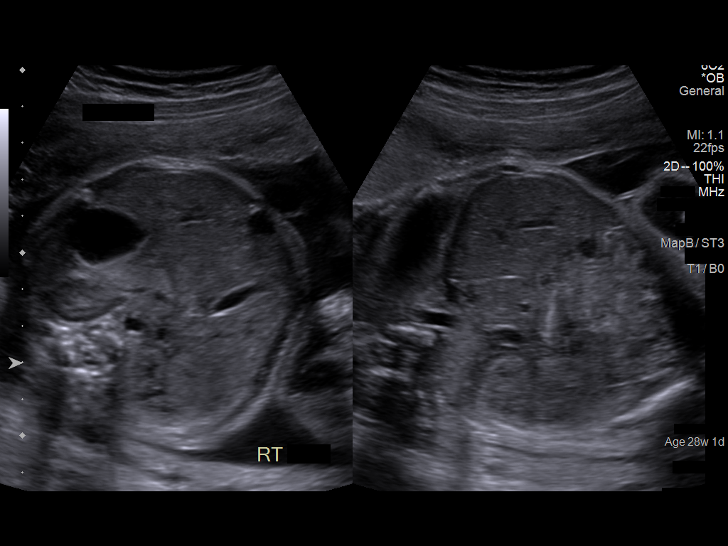
[im 94/98]
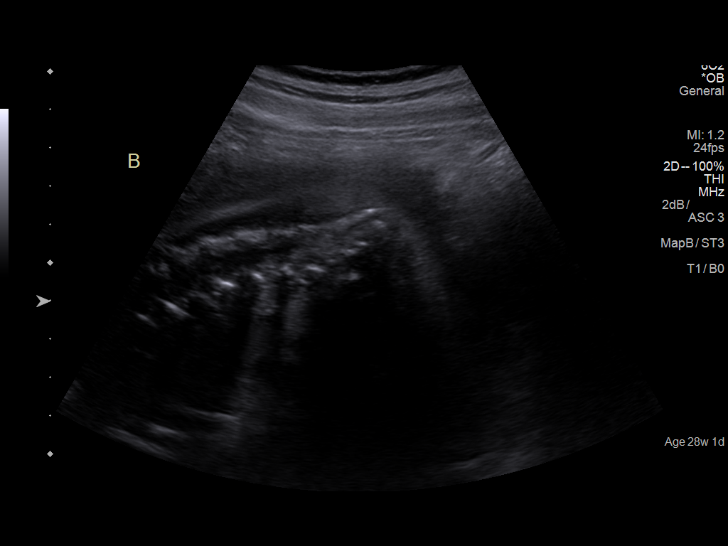

[Series 1001: us mfm ob detail+14 wk · 1 of 1 slices shown (2 of 2)]
[im 1/1]
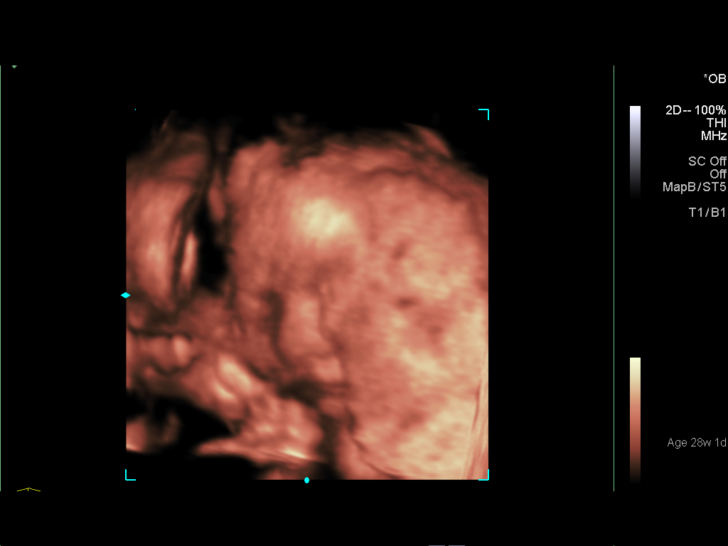

[14 of 28 positions shown; findings below may reference images not displayed]

Canned report from images found in remote index.

Refer to host system for actual result text.

## 2017-12-12 ENCOUNTER — Ambulatory Visit (INDEPENDENT_AMBULATORY_CARE_PROVIDER_SITE_OTHER): Payer: Medicaid Other | Admitting: Obstetrics and Gynecology

## 2017-12-12 ENCOUNTER — Encounter: Payer: Self-pay | Admitting: Obstetrics and Gynecology

## 2017-12-12 ENCOUNTER — Other Ambulatory Visit (HOSPITAL_COMMUNITY)
Admission: RE | Admit: 2017-12-12 | Discharge: 2017-12-12 | Disposition: A | Discharge status: Home / Self Care | Payer: Medicaid Other | Source: Ambulatory Visit | Attending: Obstetrics and Gynecology | Admitting: Obstetrics and Gynecology

## 2017-12-12 VITALS — BP 110/71 | HR 103 | Ht 67.0 in | Wt 174.1 lb

## 2017-12-12 DIAGNOSIS — N898 Other specified noninflammatory disorders of vagina: Secondary | ICD-10-CM

## 2017-12-12 DIAGNOSIS — O219 Vomiting of pregnancy, unspecified: Secondary | ICD-10-CM | POA: Diagnosis not present

## 2017-12-12 DIAGNOSIS — Z3201 Encounter for pregnancy test, result positive: Secondary | ICD-10-CM

## 2017-12-12 DIAGNOSIS — Z8759 Personal history of other complications of pregnancy, childbirth and the puerperium: Secondary | ICD-10-CM

## 2017-12-12 DIAGNOSIS — N926 Irregular menstruation, unspecified: Secondary | ICD-10-CM | POA: Diagnosis not present

## 2017-12-12 LAB — POCT URINE PREGNANCY: Preg Test, Ur: POSITIVE — AB

## 2017-12-12 MED ORDER — DOXYLAMINE-PYRIDOXINE 10-10 MG PO TBEC: 2.0000 | DELAYED_RELEASE_TABLET | Freq: Every day | ORAL | 5 refills | Status: DC

## 2017-12-12 NOTE — Progress Notes (Addendum)
   GYNECOLOGY CLINIC PROGRESS NOTE Subjective:    Colleen Lang is a 22 y.o. [phone] female with a h/o PCOS who presents for evaluation of amenorrhea. She believes she could be pregnant. Pregnancy is desired. Sexual Activity: single partner, contraception: none. Pregnancy was planned. Current symptoms also include: morning sickness, nausea and positive home pregnancy test. Last period was normal. Patient's last menstrual period was 10/24/2017.   The following portions of the patient's history were reviewed and updated as appropriate: allergies, current medications, past family history, past medical history, past social history, past surgical history and problem list.  Review of Systems A comprehensive review of systems was negative except for: Genitourinary: positive for vaginal discharge with mild odor for ~ 1 week.       Objective:    BP 110/71   Pulse (!) 103   Ht 5\' 7"  (1.702 m)   Wt 174 lb 1.6 oz (79 kg)   LMP 10/24/2017   BMI 27.27 kg/m  General: alert, no distress and no acute distress    Lab Review Urine HCG: positive    Assessment:   Missed menses  Pregnancy test performed, pregnancy confirmed Nausea and vomiting in pregnancy History of twin pregnancy in prior pregnancy  Vaginal discharge  Plan:   - Pregnancy Test: Positive: EDC: 07/31/2018, with EGA 7.0 weeks. Briefly discussed pre-natal care options. Pregnancy, Childbirth and the Newborn book given. Encouraged well-balanced diet, plenty of rest when needed, pre-natal vitamins daily and walking for exercise. Discussed self-help for nausea, avoiding OTC medications until consulting provider or pharmacist, other than Tylenol as needed, minimal caffeine (1-2 cups daily) and avoiding alcohol. She will schedule her initial OB visit in the next month, and will have a nurse intake within the next 1-2 weeks. Feel free to call with any questions.    - Prescribed Diclegis for nausea/vomiting.  - Unable to perform vaginal exam  due to patient having several small children in the exam room.  Patient allowed to go to the restroom to perform self-swab. Will notify patient of results and treat accordingly.    Hildred Laserherry, Ruari Mudgett, MD [facility]

## 2017-12-12 NOTE — Progress Notes (Signed)
PT is present today for confirmation of pregnancy. Pt LMP 10/24/17. UPT done today results were positive. Pt stated that she is doing well no complaints.

## 2017-12-13 LAB — CERVICOVAGINAL ANCILLARY ONLY
Bacterial vaginitis: POSITIVE — AB
Candida vaginitis: NEGATIVE
Trichomonas: NEGATIVE

## 2017-12-15 ENCOUNTER — Other Ambulatory Visit: Payer: Self-pay | Admitting: Obstetrics and Gynecology

## 2017-12-15 MED ORDER — METRONIDAZOLE 500 MG PO TABS: 500.0000 mg | ORAL_TABLET | Freq: Two times a day (BID) | ORAL | 0 refills | Status: DC

## 2017-12-20 ENCOUNTER — Other Ambulatory Visit: Payer: Self-pay | Admitting: Obstetrics and Gynecology

## 2017-12-20 NOTE — Addendum Note (Signed)
Addended by: Silvano BilisHAMPTON, Tariq Pernell L on: 12/20/2017 12:01 PM   Modules accepted: Orders

## 2017-12-20 NOTE — Addendum Note (Signed)
Addended by: Silvano BilisHAMPTON, Ferman Basilio L on: 12/20/2017 12:08 PM   Modules accepted: Orders

## 2017-12-20 NOTE — Progress Notes (Signed)
Colleen Lang presents for NOB nurse interview visit. Pregnancy confirmation done 12/12/17 [facility].  G-3 .  P- 3 0 0 3   . Pregnancy education material explained and given. _2_ cats in the home. NOB labs ordered. HIV labs and Drug screen were explained optional and she did not decline. Drug screen ordered. PNV encouraged. Genetic screening options discussed. Genetic testing:Declined.  Pt may discuss with provider. Pt. To follow up with provider in _3_ weeks for NOB physical.  All questions answered. Patient is [facility]. Patient has twin girls.

## 2017-12-21 ENCOUNTER — Ambulatory Visit (INDEPENDENT_AMBULATORY_CARE_PROVIDER_SITE_OTHER): Payer: Medicaid Other | Admitting: Obstetrics and Gynecology

## 2017-12-21 ENCOUNTER — Ambulatory Visit
Admission: RE | Admit: 2017-12-21 | Discharge: 2017-12-21 | Disposition: A | Discharge status: Home / Self Care | Payer: Medicaid Other | Source: Ambulatory Visit | Attending: Obstetrics and Gynecology | Admitting: Obstetrics and Gynecology

## 2017-12-21 VITALS — BP 100/64 | HR 90 | Ht 67.0 in | Wt 178.0 lb

## 2017-12-21 DIAGNOSIS — Z3401 Encounter for supervision of normal first pregnancy, first trimester: Secondary | ICD-10-CM

## 2017-12-21 DIAGNOSIS — Z8759 Personal history of other complications of pregnancy, childbirth and the puerperium: Secondary | ICD-10-CM | POA: Diagnosis present

## 2017-12-21 DIAGNOSIS — O26891 Other specified pregnancy related conditions, first trimester: Secondary | ICD-10-CM | POA: Diagnosis not present

## 2017-12-21 DIAGNOSIS — Z3201 Encounter for pregnancy test, result positive: Secondary | ICD-10-CM | POA: Diagnosis present

## 2017-12-21 DIAGNOSIS — Z3A08 8 weeks gestation of pregnancy: Secondary | ICD-10-CM | POA: Insufficient documentation

## 2017-12-21 LAB — OB RESULTS CONSOLE VARICELLA ZOSTER ANTIBODY, IGG: Varicella: IMMUNE

## 2017-12-22 LAB — ABO AND RH: Rh Factor: POSITIVE

## 2017-12-22 LAB — URINALYSIS, ROUTINE W REFLEX MICROSCOPIC
BILIRUBIN UA: NEGATIVE
Glucose, UA: NEGATIVE
Ketones, UA: NEGATIVE
LEUKOCYTES UA: NEGATIVE
Nitrite, UA: NEGATIVE
PH UA: 5.5 (ref 5.0–7.5)
PROTEIN UA: NEGATIVE
RBC UA: NEGATIVE
Specific Gravity, UA: 1.026 (ref 1.005–1.030)
Urobilinogen, Ur: 0.2 mg/dL (ref 0.2–1.0)

## 2017-12-22 LAB — CBC WITH DIFFERENTIAL
Basophils Absolute: 0.1 10*3/uL (ref 0.0–0.2)
Basos: 1 %
EOS (ABSOLUTE): 0.1 10*3/uL (ref 0.0–0.4)
EOS: 1 %
HEMATOCRIT: 37.1 % (ref 34.0–46.6)
HEMOGLOBIN: 12.6 g/dL (ref 11.1–15.9)
IMMATURE GRANS (ABS): 0 10*3/uL (ref 0.0–0.1)
IMMATURE GRANULOCYTES: 0 %
LYMPHS ABS: 2.4 10*3/uL (ref 0.7–3.1)
Lymphs: 22 %
MCH: 32.6 pg (ref 26.6–33.0)
MCHC: 34 g/dL (ref 31.5–35.7)
MCV: 96 fL (ref 79–97)
MONOCYTES: 6 %
Monocytes Absolute: 0.7 10*3/uL (ref 0.1–0.9)
NEUTROS ABS: 7.6 10*3/uL — AB (ref 1.4–7.0)
Neutrophils: 70 %
RBC: 3.87 x10E6/uL (ref 3.77–5.28)
RDW: 12.1 % — ABNORMAL LOW (ref 12.3–15.4)
WBC: 10.8 10*3/uL (ref 3.4–10.8)

## 2017-12-22 LAB — HIV ANTIBODY (ROUTINE TESTING W REFLEX): HIV SCREEN 4TH GENERATION: NONREACTIVE

## 2017-12-22 LAB — ANTIBODY SCREEN: Antibody Screen: NEGATIVE

## 2017-12-22 LAB — TOXOPLASMA ANTIBODIES- IGG AND  IGM: Toxoplasma IgG Ratio: <3 IU/mL (ref 0.0–7.1)

## 2017-12-22 LAB — RUBELLA SCREEN: RUBELLA: 4.56 {index} (ref >0.99)

## 2017-12-22 LAB — VARICELLA ZOSTER ANTIBODY, IGG: Varicella zoster IgG: 297 index (ref >165)

## 2017-12-22 LAB — HEPATITIS B SURFACE ANTIGEN: Hepatitis B Surface Ag: NEGATIVE

## 2017-12-22 LAB — RPR: RPR Ser Ql: NONREACTIVE

## 2017-12-23 LAB — URINE CULTURE

## 2017-12-24 LAB — MONITOR DRUG PROFILE 14(MW)
Amphetamine Scrn, Ur: NEGATIVE ng/mL
BARBITURATE SCREEN URINE: NEGATIVE ng/mL
BENZODIAZEPINE SCREEN, URINE: NEGATIVE ng/mL
BUPRENORPHINE, URINE: NEGATIVE ng/mL
CANNABINOIDS UR QL SCN: NEGATIVE ng/mL
COCAINE(METAB.)SCREEN, URINE: NEGATIVE ng/mL
CREATININE(CRT), U: 179.4 mg/dL (ref 20.0–300.0)
Fentanyl, Urine: NEGATIVE pg/mL
MEPERIDINE SCREEN, URINE: NEGATIVE ng/mL
METHADONE SCREEN, URINE: NEGATIVE ng/mL
OXYCODONE+OXYMORPHONE UR QL SCN: NEGATIVE ng/mL
Opiate Scrn, Ur: NEGATIVE ng/mL
PHENCYCLIDINE QUANTITATIVE URINE: NEGATIVE ng/mL
Ph of Urine: 5.8 (ref 4.5–8.9)
Propoxyphene Scrn, Ur: NEGATIVE ng/mL
SPECIFIC GRAVITY: 1.028
Tramadol Screen, Urine: NEGATIVE ng/mL

## 2017-12-24 LAB — NICOTINE SCREEN, URINE: COTININE UR QL SCN: NEGATIVE ng/mL

## 2017-12-26 LAB — GC/CHLAMYDIA PROBE AMP
Chlamydia trachomatis, NAA: NEGATIVE
NEISSERIA GONORRHOEAE BY PCR: NEGATIVE

## 2018-01-04 ENCOUNTER — Telehealth: Payer: Self-pay | Admitting: Obstetrics and Gynecology

## 2018-01-04 MED ORDER — ONDANSETRON HCL 4 MG PO TABS: 4.0000 mg | ORAL_TABLET | Freq: Three times a day (TID) | ORAL | 0 refills | Status: DC | PRN

## 2018-01-04 NOTE — Telephone Encounter (Signed)
The patient called and stated that she would like to speak with a nurse or Dr. Valentino Saxonherry in regards to her having some nausea that is causing her to not sleep or eat. The patient is requesting a call back if possible today, Please advise.

## 2018-01-04 NOTE — Telephone Encounter (Signed)
Pt was called and she stated that Diclegis was not helping her. Pt was informed that Zofran 4mg  would be sent in to her pharmacy.

## 2018-01-09 NOTE — L&D Delivery Note (Signed)
Delivery Summary for Colleen Lang  Labor Events:   Preterm labor:   Rupture date: 08/03/2018  Rupture time: 1:20 AM  Rupture type: Artificial  Fluid Color: Clear White  Induction:   Augmentation:   Complications:   Cervical ripening:          Delivery:   Episiotomy:   Lacerations:   Repair suture:   Repair # of packets:   Blood loss (ml): 545   Information for the patient's newborn:  Colleen, Lang [[id number]]    Delivery 08/03/2018 5:02 AM by  Vaginal, Spontaneous Sex:  female Gestational Age: [redacted]w[redacted]d Delivery Clinician:   Living?:         APGARS  One minute Five minutes Ten minutes  Skin color:        Heart rate:        Grimace:        Muscle tone:        Breathing:        Totals: 6  9      Presentation/position:      Resuscitation:   Cord information:    Disposition of cord blood:     Blood gases sent?  Complications:   Placenta: Delivered:       appearance Newborn Measurements: Weight: 8 lb 14.9 oz (4050 g)  Height: 21"  Head circumference:    Chest circumference:    Other providers:    Additional  information: Forceps:   Vacuum:   Breech:   Observed anomalies no anomalies noted at delivery      Delivery Note At 5:02 AM a viable and healthy female was delivered via Vaginal, Spontaneous (Presentation: Vertex; Direct OA).  APGAR: 6, 9; weight  4050 grams.   Placenta status: spontaneously removed, intact.  Cord: 3-vessel with the following complications: Tight nuchal cord x 1, reducible after delivery of fetal body.  Cord pH: not obtained.  Delayed cord clamping observed.   Anesthesia:  Epidural Episiotomy: None Lacerations: None Suture Repair: None Est. Blood Loss (mL): 545  Mom to postpartum.  Baby to Couplet care / Skin to Skin.  Rubie Maid, MD 08/03/2018, 5:49 AM

## 2018-01-17 ENCOUNTER — Ambulatory Visit (INDEPENDENT_AMBULATORY_CARE_PROVIDER_SITE_OTHER): Payer: Medicaid Other | Admitting: Obstetrics and Gynecology

## 2018-01-17 ENCOUNTER — Other Ambulatory Visit (HOSPITAL_COMMUNITY)
Admission: RE | Admit: 2018-01-17 | Discharge: 2018-01-17 | Disposition: A | Discharge status: Home / Self Care | Payer: Medicaid Other | Source: Ambulatory Visit | Attending: Obstetrics and Gynecology | Admitting: Obstetrics and Gynecology

## 2018-01-17 ENCOUNTER — Encounter: Payer: Self-pay | Admitting: Obstetrics and Gynecology

## 2018-01-17 VITALS — BP 111/71 | HR 123 | Ht 67.0 in | Wt 171.7 lb

## 2018-01-17 DIAGNOSIS — Z3401 Encounter for supervision of normal first pregnancy, first trimester: Secondary | ICD-10-CM

## 2018-01-17 LAB — POCT URINALYSIS DIPSTICK OB
BILIRUBIN UA: NEGATIVE
Blood, UA: NEGATIVE
Glucose, UA: NEGATIVE
KETONES UA: NEGATIVE
Leukocytes, UA: NEGATIVE
Nitrite, UA: NEGATIVE
PH UA: 6 (ref 5.0–8.0)
PROTEIN: NEGATIVE
SPEC GRAV UA: 1.01 (ref 1.010–1.025)
UROBILINOGEN UA: 0.2 U/dL

## 2018-01-17 NOTE — Progress Notes (Signed)
NOB:   Patient doing well-has some pelvic pressure symptoms but no other associated problems.  She is taking prenatal vitamins.  Desires maternity 21 today.  Desires AFP next visit.  Pap performed today.  Physical examination General NAD, Conversant  HEENT Atraumatic; Op clear with mmm.  Normo-cephalic. Pupils reactive. Anicteric sclerae  Thyroid/Neck Smooth without nodularity or enlargement. Normal ROM.  Neck Supple.  Skin No rashes, lesions or ulceration. Normal palpated skin turgor. No nodularity.  Breasts: No masses or discharge.  Symmetric.  No axillary adenopathy.  Lungs: Clear to auscultation.No rales or wheezes. Normal Respiratory effort, no retractions.  Heart: NSR.  No murmurs or rubs appreciated. No periferal edema  Abdomen: Soft.  Non-tender.  No masses.  No HSM. No hernia  Extremities: Moves all appropriately.  Normal ROM for age. No lymphadenopathy.  Neuro: Oriented to PPT.  Normal mood. Normal affect.     Pelvic:   Vulva: Normal appearance.  No lesions.  Vagina: No lesions or abnormalities noted.  Support: Normal pelvic support.  Urethra No masses tenderness or scarring.  Meatus Normal size without lesions or prolapse.  Cervix: Normal appearance.  No lesions.  Anus: Normal exam.  No lesions.  Perineum: Normal exam.  No lesions.        Bimanual   Adnexae: No masses.  Non-tender to palpation.  Uterus: Enlarged. 12weeks Pos FHTs  Non-tender.  Mobile.  AV.  Adnexae: No masses.  Non-tender to palpation.  Cul-de-sac: Negative for abnormality.  Adnexae: No masses.  Non-tender to palpation.         Pelvimetry   Diagonal: Reached.  Spines: Average.  Sacrum: Concave.  Pubic Arch: Normal.

## 2018-01-17 NOTE — Progress Notes (Signed)
Pt is present today for NOB PE. Pt stated that she having back pain but had it before the pregnancy. Pt stated that she noticed at night that her stomach started to feel like she is going to have diarrhea but do not. No other complaints.

## 2018-01-18 LAB — CYTOLOGY - PAP
CHLAMYDIA, DNA PROBE: NEGATIVE
DIAGNOSIS: NEGATIVE
Neisseria Gonorrhea: NEGATIVE

## 2018-01-19 IMAGING — US US OB FOLLOW-UP
1 series · 14 of 28 positions shown · non-contrast
Comparison: none

[Series 1: us ob follow-up · 0.15mm/px · 14 of 74 slices shown]
[im 3/74]
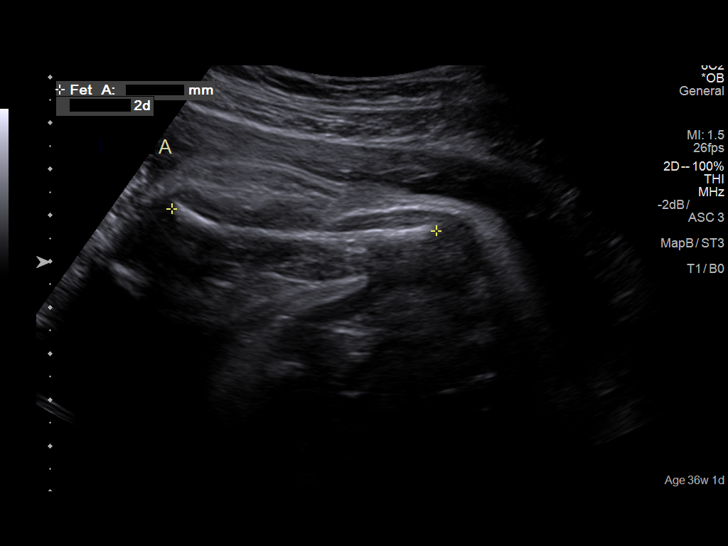
[im 9/74]
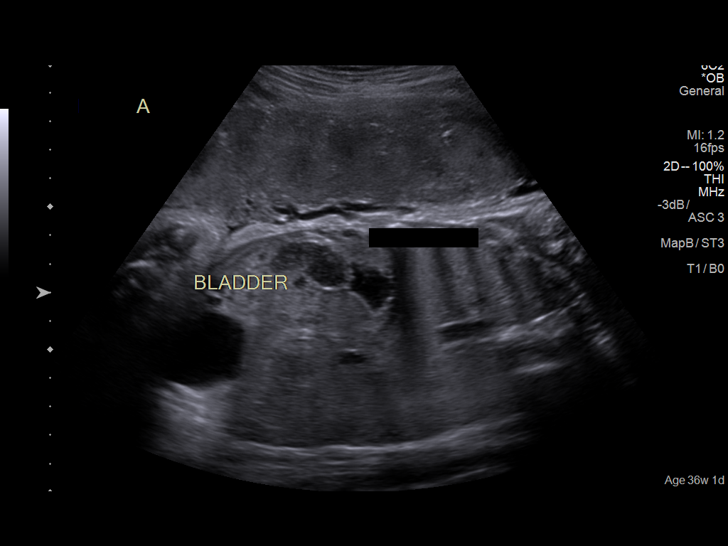
[im 14/74]
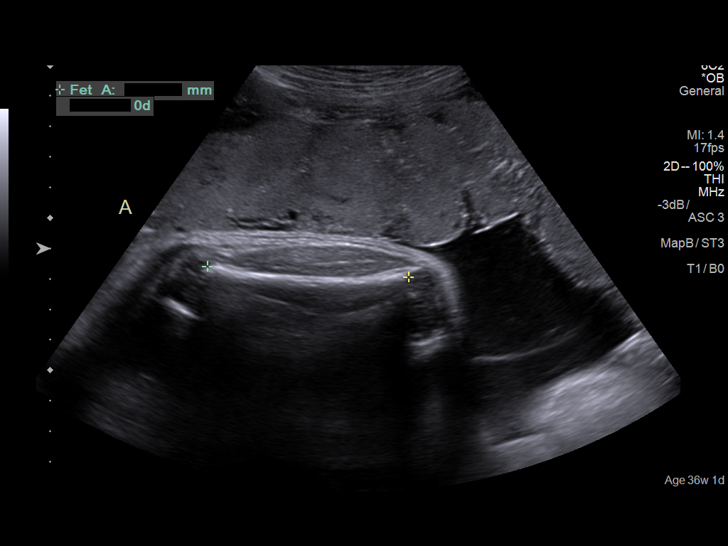
[im 19/74]
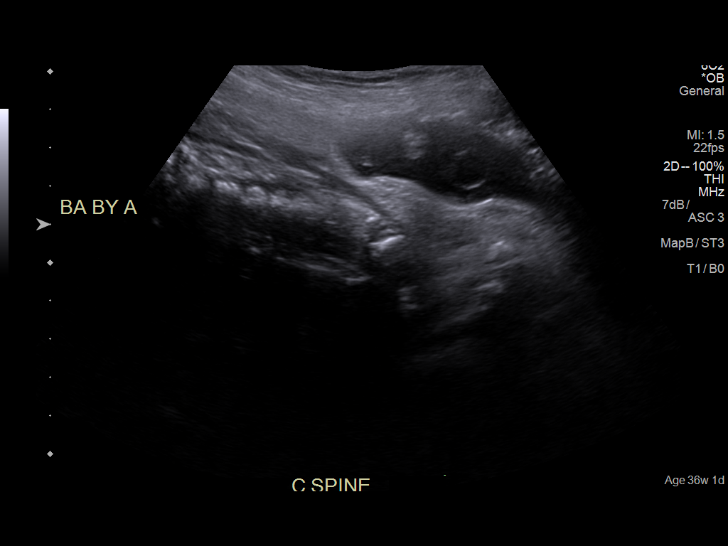
[im 25/74]
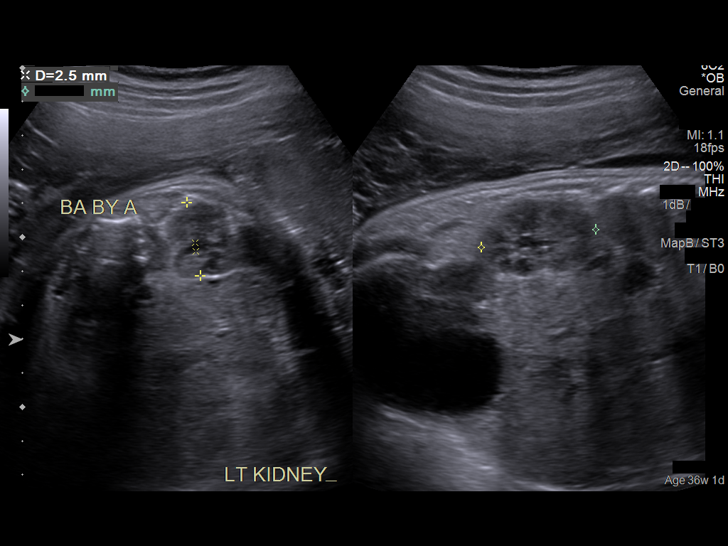
[im 30/74]
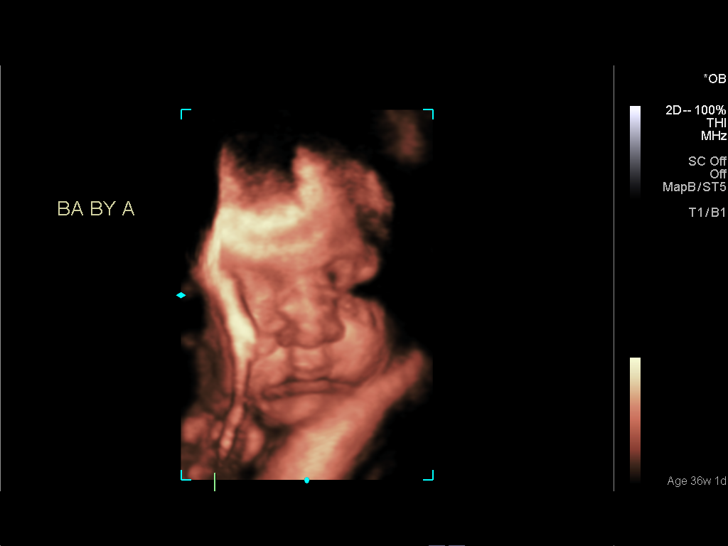
[im 36/74]
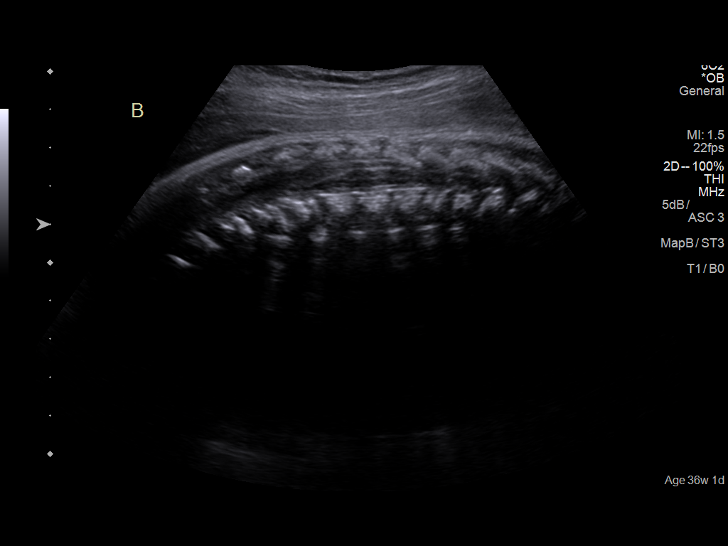
[im 41/74]
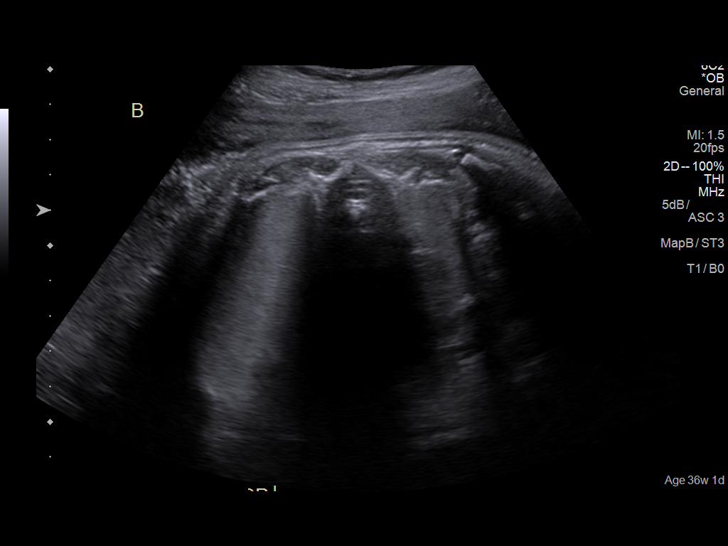
[im 46/74]
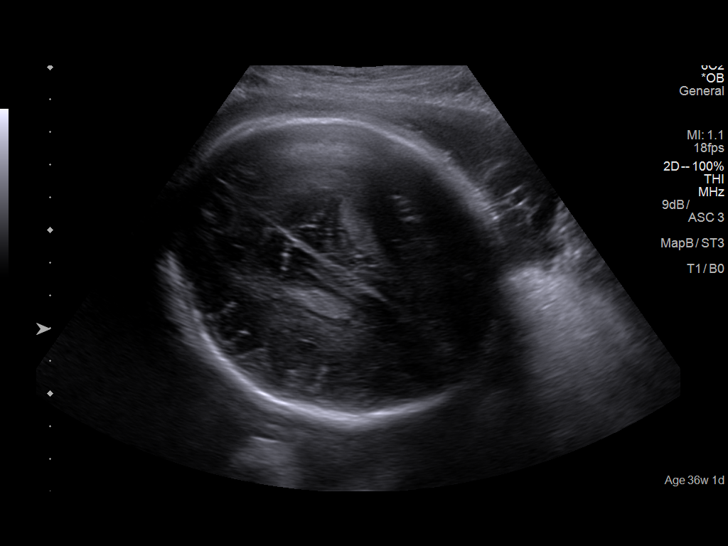
[im 52/74]
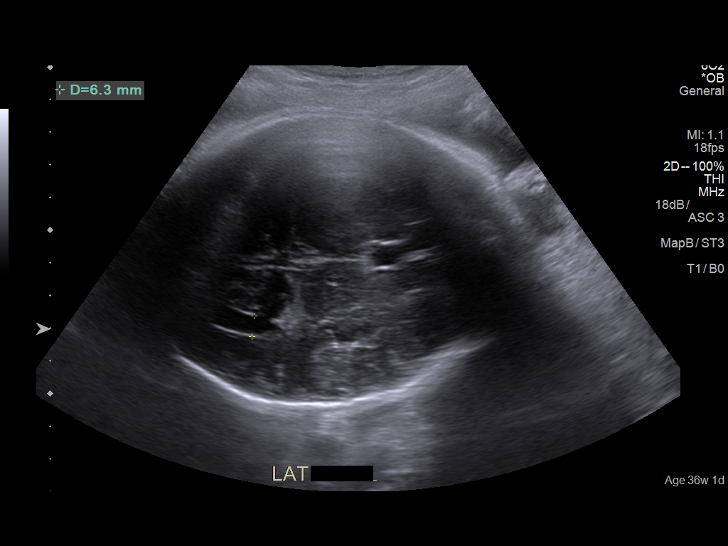
[im 57/74]
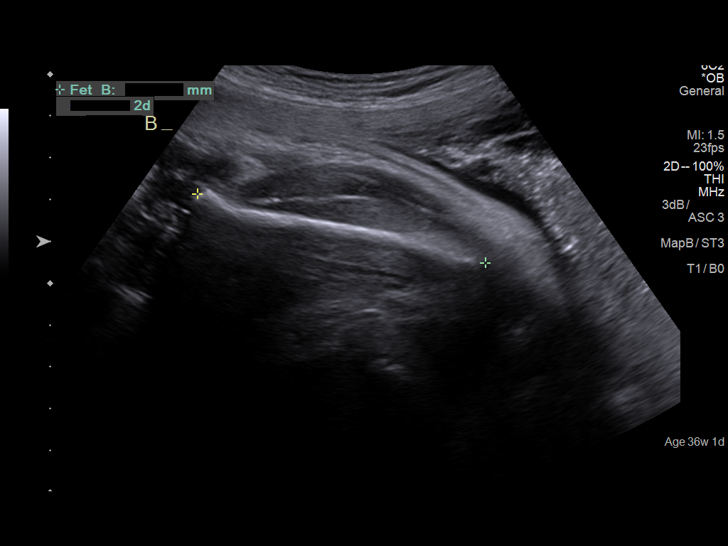
[im 63/74]
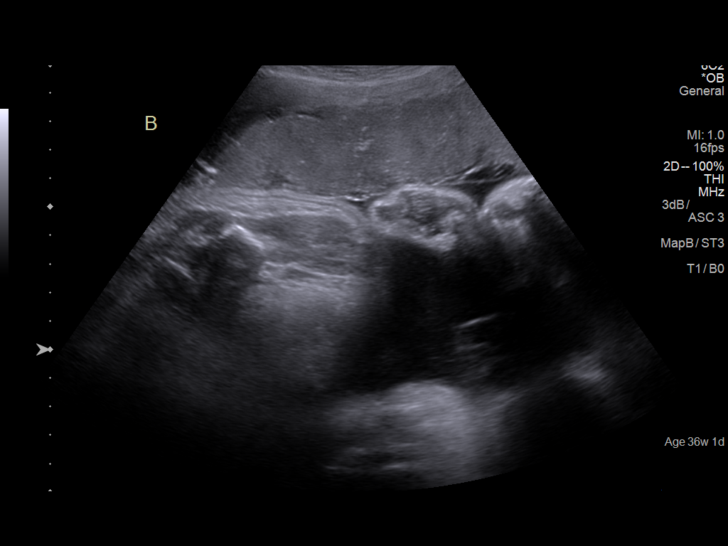
[im 68/74]
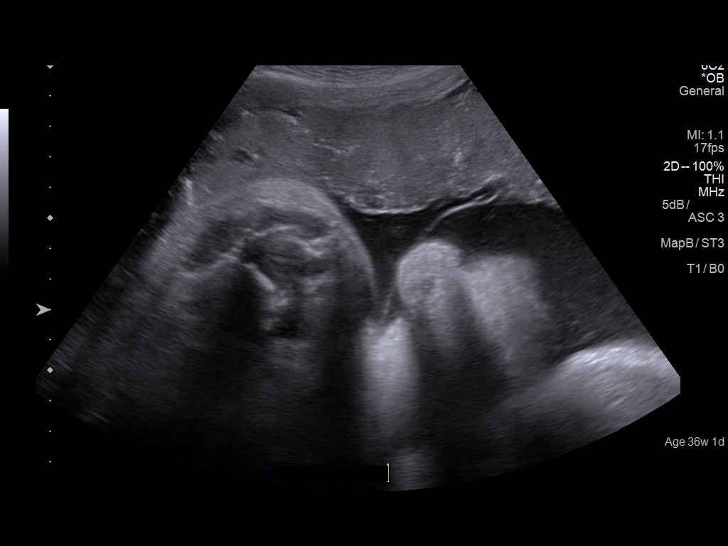
[im 74/74]
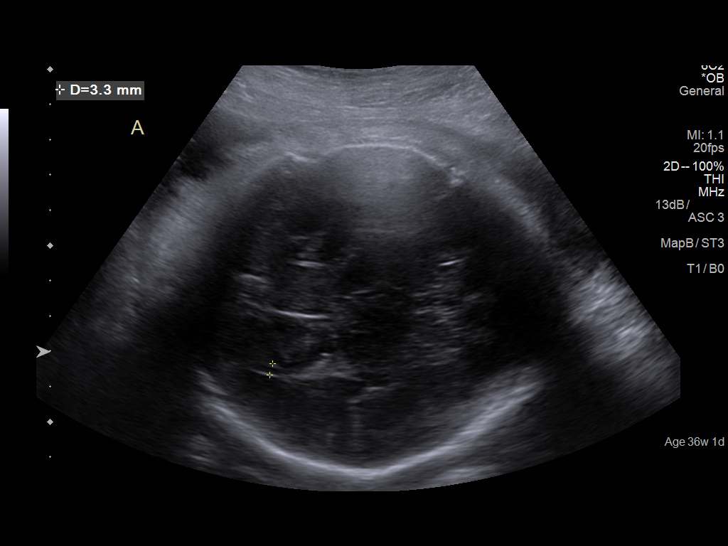

[14 of 28 positions shown; findings below may reference images not displayed]

Canned report from images found in remote index.

Refer to host system for actual result text.

## 2018-01-23 LAB — MATERNIT 21 PLUS CORE, BLOOD
CHROMOSOME 18: NEGATIVE
Chromosome 13: NEGATIVE
Chromosome 21: NEGATIVE
Y CHROMOSOME: DETECTED

## 2018-01-31 ENCOUNTER — Ambulatory Visit (INDEPENDENT_AMBULATORY_CARE_PROVIDER_SITE_OTHER): Payer: Medicaid Other | Admitting: Obstetrics and Gynecology

## 2018-01-31 ENCOUNTER — Encounter: Payer: Self-pay | Admitting: Obstetrics and Gynecology

## 2018-01-31 VITALS — BP 133/80 | HR 121 | Wt 169.5 lb

## 2018-01-31 DIAGNOSIS — K5900 Constipation, unspecified: Secondary | ICD-10-CM | POA: Diagnosis not present

## 2018-01-31 DIAGNOSIS — O219 Vomiting of pregnancy, unspecified: Secondary | ICD-10-CM

## 2018-01-31 DIAGNOSIS — Z3401 Encounter for supervision of normal first pregnancy, first trimester: Secondary | ICD-10-CM | POA: Diagnosis not present

## 2018-01-31 DIAGNOSIS — Z3201 Encounter for pregnancy test, result positive: Secondary | ICD-10-CM | POA: Diagnosis not present

## 2018-01-31 LAB — POCT URINALYSIS DIPSTICK OB
BILIRUBIN UA: NEGATIVE
GLUCOSE, UA: NEGATIVE
Ketones, UA: 80
Leukocytes, UA: NEGATIVE
Nitrite, UA: NEGATIVE
RBC UA: NEGATIVE
Spec Grav, UA: 1.01 (ref 1.010–1.025)
Urobilinogen, UA: 0.2 E.U./dL
pH, UA: 5 (ref 5.0–8.0)

## 2018-01-31 NOTE — Progress Notes (Signed)
ROB: Patient complains of not keeping any solid food down just liquids, chronic (greater than 1 week) constipation, feeling lightheaded and seeing spots when initially standing up.  She is found to have 80 of ketones today.  She says the Diclegis is helping and she would like a refill. Increasing hydration using lemonade, dilute Gatorade, water discussed.  Frequent small meals, brat diet, if patient continues to experience persistent vomiting she is to present to the emergency department or see [place] early next week. Constipation and use of Citrucel discussed. I spent 12 minutes involved in the care of this patient of which greater than 50% was spent discussing chronic constipation, persistent nausea and vomiting, dehydration symptoms.

## 2018-02-13 ENCOUNTER — Telehealth: Payer: Self-pay | Admitting: Obstetrics and Gynecology

## 2018-02-13 NOTE — Telephone Encounter (Signed)
Patient called stating she is lightheaded, dizzy, heart beating fast, eye sight blacking out. She states the symptoms are very similar to a panic attack. She states she is staying hydrated and eating well. Please Advise. Thanks

## 2018-02-15 ENCOUNTER — Encounter: Payer: Medicaid Other | Admitting: Obstetrics and Gynecology

## 2018-02-15 NOTE — Telephone Encounter (Signed)
Pt was called and spoke to her about her symptoms pt is scheduled to come into the office today at 11am to see DJE.

## 2018-02-18 DIAGNOSIS — R509 Fever, unspecified: Secondary | ICD-10-CM | POA: Diagnosis not present

## 2018-02-18 DIAGNOSIS — J101 Influenza due to other identified influenza virus with other respiratory manifestations: Secondary | ICD-10-CM | POA: Diagnosis not present

## 2018-02-18 DIAGNOSIS — O99512 Diseases of the respiratory system complicating pregnancy, second trimester: Secondary | ICD-10-CM | POA: Diagnosis not present

## 2018-02-18 DIAGNOSIS — Z3A17 17 weeks gestation of pregnancy: Secondary | ICD-10-CM | POA: Diagnosis not present

## 2018-02-18 DIAGNOSIS — O9989 Other specified diseases and conditions complicating pregnancy, childbirth and the puerperium: Secondary | ICD-10-CM | POA: Diagnosis not present

## 2018-02-18 DIAGNOSIS — O210 Mild hyperemesis gravidarum: Secondary | ICD-10-CM | POA: Diagnosis not present

## 2018-02-18 DIAGNOSIS — R109 Unspecified abdominal pain: Secondary | ICD-10-CM | POA: Diagnosis not present

## 2018-02-18 DIAGNOSIS — Z20828 Contact with and (suspected) exposure to other viral communicable diseases: Secondary | ICD-10-CM | POA: Diagnosis not present

## 2018-02-18 DIAGNOSIS — Z331 Pregnant state, incidental: Secondary | ICD-10-CM | POA: Diagnosis not present

## 2018-02-18 DIAGNOSIS — R1032 Left lower quadrant pain: Secondary | ICD-10-CM | POA: Diagnosis not present

## 2018-02-18 DIAGNOSIS — Z3A16 16 weeks gestation of pregnancy: Secondary | ICD-10-CM | POA: Diagnosis not present

## 2018-02-20 ENCOUNTER — Ambulatory Visit (INDEPENDENT_AMBULATORY_CARE_PROVIDER_SITE_OTHER): Payer: Medicaid Other | Admitting: Obstetrics and Gynecology

## 2018-02-20 ENCOUNTER — Encounter: Payer: Self-pay | Admitting: Obstetrics and Gynecology

## 2018-02-20 VITALS — BP 107/71 | HR 109 | Wt 173.9 lb

## 2018-02-20 DIAGNOSIS — Z3401 Encounter for supervision of normal first pregnancy, first trimester: Secondary | ICD-10-CM

## 2018-02-20 LAB — POCT URINALYSIS DIPSTICK OB
BILIRUBIN UA: NEGATIVE
Glucose, UA: NEGATIVE
KETONES UA: NEGATIVE
Leukocytes, UA: NEGATIVE
NITRITE UA: NEGATIVE
PH UA: 6.5 (ref 5.0–8.0)
POC,PROTEIN,UA: NEGATIVE
RBC UA: NEGATIVE
SPEC GRAV UA: 1.01 (ref 1.010–1.025)
Urobilinogen, UA: 0.2 E.U./dL

## 2018-02-20 NOTE — Progress Notes (Signed)
labPatient comes in today for ROB visit. Patient was seen at [facility] ED for positive Flu on 02/18/2018. She is complaining of lightheaded and dizziness.

## 2018-02-20 NOTE — Progress Notes (Signed)
ROB: Patient diagnosed and treated at [facility] for flu symptoms.  Starting to feel better today.  AFP.  FAS scheduled

## 2018-02-22 LAB — AFP, SERUM, OPEN SPINA BIFIDA
AFP MOM: 0.9
AFP Value: 31 ng/mL
GEST. AGE ON COLLECTION DATE: 17 wk
Maternal Age At EDD: 24.7 yr
OSBR Risk 1 IN: 10000
TEST RESULTS AFP: NEGATIVE
Weight: 173 [lb_av]

## 2018-03-20 ENCOUNTER — Encounter: Payer: Self-pay | Admitting: Obstetrics and Gynecology

## 2018-03-20 ENCOUNTER — Ambulatory Visit (INDEPENDENT_AMBULATORY_CARE_PROVIDER_SITE_OTHER): Payer: Medicaid Other | Admitting: Obstetrics and Gynecology

## 2018-03-20 ENCOUNTER — Other Ambulatory Visit: Payer: Medicaid Other

## 2018-03-20 ENCOUNTER — Other Ambulatory Visit: Payer: Self-pay

## 2018-03-20 VITALS — BP 118/82 | HR 117 | Wt 177.5 lb

## 2018-03-20 DIAGNOSIS — Z3482 Encounter for supervision of other normal pregnancy, second trimester: Secondary | ICD-10-CM

## 2018-03-20 LAB — POCT URINALYSIS DIPSTICK OB
Bilirubin, UA: NEGATIVE
Blood, UA: NEGATIVE
Glucose, UA: NEGATIVE
KETONES UA: NEGATIVE
Leukocytes, UA: NEGATIVE
NITRITE UA: NEGATIVE
POC,PROTEIN,UA: NEGATIVE
Urobilinogen, UA: 0.2 E.U./dL
pH, UA: 6.5 (ref 5.0–8.0)

## 2018-03-20 NOTE — Progress Notes (Signed)
ROB: Notes pelvic pain and lower back pain. Also notes increased vaginal discharge with no odor or smell. Discussed use of pregnancy band and Tylenol prn.  Also noting increased heart rate.  Notes it occurs when BP drops.  Discussed vasovagal symptoms in pregnancy, warning signs.  Encouraged adequate hydration.  Notes anatomy scan was rescheduled for the end of March, will try to schedule earlier. RTC in 4 weeks.

## 2018-03-20 NOTE — Progress Notes (Signed)
ROB-Pt stated that she is having lower back and pelvic pain

## 2018-03-29 ENCOUNTER — Ambulatory Visit
Admission: RE | Admit: 2018-03-29 | Discharge: 2018-03-29 | Disposition: A | Discharge status: Home / Self Care | Payer: Medicaid Other | Source: Ambulatory Visit | Attending: Obstetrics and Gynecology | Admitting: Obstetrics and Gynecology

## 2018-03-29 ENCOUNTER — Other Ambulatory Visit: Payer: Self-pay

## 2018-03-29 DIAGNOSIS — Z3492 Encounter for supervision of normal pregnancy, unspecified, second trimester: Secondary | ICD-10-CM | POA: Diagnosis not present

## 2018-03-29 DIAGNOSIS — Z3401 Encounter for supervision of normal first pregnancy, first trimester: Secondary | ICD-10-CM | POA: Diagnosis not present

## 2018-04-04 ENCOUNTER — Ambulatory Visit: Payer: Medicaid Other

## 2018-04-16 ENCOUNTER — Telehealth: Payer: Self-pay

## 2018-04-16 NOTE — Telephone Encounter (Signed)
Pt called to go over COVID-19 prescreening questions. Pt stated that she was not having sx related to COVID-19. Pt is aware of the no visitors policy. Pt voiced that she understood,

## 2018-04-17 ENCOUNTER — Other Ambulatory Visit: Payer: Self-pay

## 2018-04-17 ENCOUNTER — Ambulatory Visit (INDEPENDENT_AMBULATORY_CARE_PROVIDER_SITE_OTHER): Payer: Medicaid Other | Admitting: Obstetrics and Gynecology

## 2018-04-17 ENCOUNTER — Encounter: Payer: Self-pay | Admitting: Obstetrics and Gynecology

## 2018-04-17 VITALS — BP 120/75 | HR 104 | Ht 67.0 in | Wt 188.1 lb

## 2018-04-17 DIAGNOSIS — Z23 Encounter for immunization: Secondary | ICD-10-CM | POA: Diagnosis not present

## 2018-04-17 DIAGNOSIS — Z3482 Encounter for supervision of other normal pregnancy, second trimester: Secondary | ICD-10-CM

## 2018-04-17 LAB — POCT URINALYSIS DIPSTICK OB
Bilirubin, UA: NEGATIVE
Blood, UA: NEGATIVE
Glucose, UA: NEGATIVE
Ketones, UA: NEGATIVE
Nitrite, UA: NEGATIVE
Spec Grav, UA: 1.01 (ref 1.010–1.025)
Urobilinogen, UA: 0.2 E.U./dL
pH, UA: 6.5 (ref 5.0–8.0)

## 2018-04-17 NOTE — Patient Instructions (Signed)
Q: Why are visitor restrictions different for maternity care areas? [place] is restricting visitors for the duration of the patient's hospitalization. The birth of a child involves the mother, considered the patient, and a birthing partner. These are unprecedented times and we are making the exception to allow a birthing partner to be a part of the patient unit. No other guests will be allowed in our [facility] at [facility] and at [facility].  Q: Are credentialed doulas allowed to support their existing patients? We acknowledge the value these doula partnerships offer our care teams and many birthing families in our communities. Each laboring mother is allowed one birthing partner of the patient's choosing for her entire hospitalization.  Q: Are visitor restrictions different for hospitalized children? Pediatric patients (infants and children under 17 years of age), such as those in the Children's Unit, Pediatric ICU and NICU, will be allowed two visitors (parents or legal guardians)  Q: Are pregnant women at an increased risk for COVID-19? The American College of Obstetricians and Gynecologists (ACOG) is monitoring closely the coronavirus pandemic. With the limited information available, data does not indicate pregnant women are at an increased risk. However, pregnant women are known to be at greater risk for respiratory infections like flu. With that in mind, expectant mothers are considered an at-risk population for COVID-19, according to ACOG.  Q: Are newborns at an increased risk for COVID-19? A limited sample of COVID-19 data with newborns indicates the virus is not transferred to the infant during pregnancy. However, postpartum separation is recommended by the Centers for Disease Control (CDC). As a result [place] recommends and strongly encourages temporary separation of moms and babies who test positive for COVID-19 or are awaiting results to rule out  COVID-19 based on CDC guidelines.  Q: If you have a suspected case of COVID-19, is the NICU couplet care room an option? No. If either patient is considered at-risk for having COVID-19, the [facility] at [facility] will not use the NICU couplet care rooms for that family.  Q: [place] is urging that elective procedures be postponed. What is considered elective for women's and children's service line? NOT ELECTIVE: Obstetric procedures, even those with an element of choice on timing, are not considered elective. Circumcisions are considered elective procedures, however, these do not deplete blood products and other resources, which is the spirit in which the COVID-19 postponement of elective procedures was intended. Therefore, circumcisions will be allowed.  ELECTIVE: Postpartum tubal ligations are considered elective and should be postponed.  [place] supports as much as possible the medical care team working with the patient's individual needs to address timing during these unprecedented times. We seek the support of our medical care team in preserving needed resources throughout our crisis response to COVID-19.  Q: How does COVID-19 impact breastfeeding? Breastmilk is safe for your baby - even if the mother has tested positive for COVID-19. We suggest the mother pump her milk and have a healthy family member feed the baby to protect the baby from getting the virus.  If a COVID-19+ mother decides to breastfeed after discharge, we suggest proper protective equipment be worn and hand hygiene be performed before and after feeding the infant.  Q: Should we urge patients to avoid baby showers and large gatherings? Yes. As has been recommended for all citizens in our communities, gatherings of 10 or more should be avoided - pregnant or not. Seek creative options   for "hosting" baby showers through electronic means that honor the request for social distancing during this  time of heightened awareness.  Q: Should patients miss their prenatal appointments? No. Prenatal visits are NOT elective. While we want to limit contact and exposure, prenatal care is vital right now. Contact your physician's office if you have concerns about your visits. We are limiting outpatient office visits to the patient and one guest in order to reduce the potential for exposure.  Q: What if a pregnant woman feels sick? Should she miss her prenatal visit then? A pregnant woman experiencing coronavirus-like symptoms (i.e., cough, fever, difficulty breathing, shortness of breath, gastrointestinal issues) should contact her pregnancy care provider by phone. Her medical professional can best determine whether she should use a video visit or possibly go to a collection site to be tested for COVID-19. Contacting her primary care provider or her pregnancy care provider is her first step.  Q: What can I do about childbirth education? All the classes are cancelled. The [facility] Centers will offer online learning to support mothers on their journey.  We currently offer Understanding Childbirth, Understanding Breastfeeding and Understanding Newborn Care as an online class.  Please visit our website, [email or web address], to register for an online class.  Q: How can I keep from getting COVID-19? Together, we can reduce the risk of exposure to the virus and help you and your family remain healthy and safe. One of the best ways to protect yourself is to wash your hands frequently using soap and water. Also, you should avoid touching your eyes, nose and mouth with unwashed hands, avoid physical contact with others and practice social distancing.   

## 2018-04-17 NOTE — Progress Notes (Signed)
Patient comes in today for ROB visit. She is having some acid reflux. She is taking Tums that is not helping.

## 2018-04-17 NOTE — Progress Notes (Signed)
ROB: Patient with heartburn -sounds more like reflux.  Discussed Prilosec OTC.  Needs 1 hour GCT-have arranged GCT visit in 3 weeks, patient will drink the drink, record the exact time of completion, and return to the office at exactly 50 minutes after consuming the drink for blood draw only.

## 2018-05-07 ENCOUNTER — Telehealth: Payer: Self-pay

## 2018-05-07 NOTE — Telephone Encounter (Signed)
Coronavirus (COVID-19) Are you at risk?  Are you at risk for the Coronavirus (COVID-19)?  To be considered HIGH RISK for Coronavirus (COVID-19), you have to meet the following criteria:  . Traveled to [place], [place], [place], [place] or [place]; or in the [place] to [place], [place], [place], or [place]; and have fever, cough, and shortness of breath within the last 2 weeks of travel OR . Been in close contact with a person diagnosed with COVID-19 within the last 2 weeks and have fever, cough, and shortness of breath . IF YOU DO NOT MEET THESE CRITERIA, YOU ARE CONSIDERED LOW RISK FOR COVID-19.  What to do if you are HIGH RISK for COVID-19?  . If you are having a medical emergency, call 911. . Seek medical care right away. Before you go to a doctor's office, urgent care or emergency department, call ahead and tell them about your recent travel, contact with someone diagnosed with COVID-19, and your symptoms. You should receive instructions from your physician's office regarding next steps of care.  . When you arrive at healthcare provider, tell the healthcare staff immediately you have returned from visiting [place], [place], [place], [place] or [place]; or traveled in the [place] to [place], [place], [place], or [place]; in the last two weeks or you have been in close contact with a person diagnosed with COVID-19 in the last 2 weeks.   . Tell the health care staff about your symptoms: fever, cough and shortness of breath. . After you have been seen by a medical provider, you will be either: o Tested for (COVID-19) and discharged home on quarantine except to seek medical care if symptoms worsen, and asked to  - Stay home and avoid contact with others until you get your results (4-5 days)  - Avoid travel on public transportation if possible (such as bus, train, or airplane) or o Sent to the Emergency Department by EMS for evaluation, COVID-19 testing, and possible  admission depending on your condition and test results.  What to do if you are LOW RISK for COVID-19?  Reduce your risk of any infection by using the same precautions used for avoiding the common cold or flu:  . Wash your hands often with soap and warm water for at least 20 seconds.  If soap and water are not readily available, use an alcohol-based hand sanitizer with at least 60% alcohol.  . If coughing or sneezing, cover your mouth and nose by coughing or sneezing into the elbow areas of your shirt or coat, into a tissue or into your sleeve (not your hands). . Avoid shaking hands with others and consider head nods or verbal greetings only. . Avoid touching your eyes, nose, or mouth with unwashed hands.  . Avoid close contact with people who are sick. . Avoid places or events with large numbers of people in one location, like concerts or sporting events. . Carefully consider travel plans you have or are making. . If you are planning any travel outside or inside the US, visit the CDC's Travelers' Health webpage for the latest health notices. . If you have some symptoms but not all symptoms, continue to monitor at home and seek medical attention if your symptoms worsen. . If you are having a medical emergency, call 911.   ADDITIONAL HEALTHCARE OPTIONS FOR PATIENTS  [place] [place] [facility] / e-Visit: [email or web address]         [facility]: [phone]  [place]   [facility]: [phone]                   [facility]: [phone]   Prescreened. Neg .cm 

## 2018-05-08 ENCOUNTER — Other Ambulatory Visit: Payer: Medicaid Other

## 2018-05-08 ENCOUNTER — Other Ambulatory Visit: Payer: Self-pay

## 2018-05-08 DIAGNOSIS — Z3482 Encounter for supervision of other normal pregnancy, second trimester: Secondary | ICD-10-CM

## 2018-05-08 DIAGNOSIS — Z131 Encounter for screening for diabetes mellitus: Secondary | ICD-10-CM | POA: Diagnosis not present

## 2018-05-09 LAB — GLUCOSE, 1 HOUR GESTATIONAL: Gestational Diabetes Screen: 127 mg/dL (ref 65–139)

## 2018-05-14 ENCOUNTER — Telehealth: Payer: Self-pay

## 2018-05-14 NOTE — Telephone Encounter (Signed)
Pt was called back and wanted to see a provider due to the pelvic pain and contractions that she having continually.
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
## 2018-05-15 ENCOUNTER — Other Ambulatory Visit: Payer: Self-pay

## 2018-05-15 ENCOUNTER — Encounter: Payer: Self-pay | Admitting: Obstetrics and Gynecology

## 2018-05-15 ENCOUNTER — Ambulatory Visit (INDEPENDENT_AMBULATORY_CARE_PROVIDER_SITE_OTHER): Payer: Medicaid Other | Admitting: Obstetrics and Gynecology

## 2018-05-15 VITALS — BP 122/86 | HR 101 | Ht 67.0 in | Wt 192.1 lb

## 2018-05-15 DIAGNOSIS — Z3482 Encounter for supervision of other normal pregnancy, second trimester: Secondary | ICD-10-CM

## 2018-05-15 LAB — POCT URINALYSIS DIPSTICK OB
Bilirubin, UA: NEGATIVE
Blood, UA: NEGATIVE
Glucose, UA: NEGATIVE
Ketones, UA: NEGATIVE
Leukocytes, UA: NEGATIVE
Nitrite, UA: NEGATIVE
POC,PROTEIN,UA: NEGATIVE
Spec Grav, UA: 1.01 (ref 1.010–1.025)
Urobilinogen, UA: 0.2 E.U./dL
pH, UA: 7.5 (ref 5.0–8.0)

## 2018-05-15 NOTE — Progress Notes (Signed)
ROB: Patient states that she has been having more contractions over the last 2 weeks.  Reports that she fell 2 weeks ago and feels the contractions more since then.  Reports daily fetal movement.  Denies any vaginal bleeding or leakage of fluid.  Likely Braxton Hicks contractions-discussed preterm labor versus Colleen Lang in detail.

## 2018-05-15 NOTE — Progress Notes (Signed)
Patient comes in today for unscheduled ROB. She has been having contractions off and on for two weeks. She fell 2 days ago on her back. She has bruise on her bottom.

## 2018-05-29 ENCOUNTER — Encounter: Payer: Medicaid Other | Admitting: Obstetrics and Gynecology

## 2018-05-30 ENCOUNTER — Ambulatory Visit (INDEPENDENT_AMBULATORY_CARE_PROVIDER_SITE_OTHER): Payer: Medicaid Other | Admitting: Obstetrics and Gynecology

## 2018-05-30 ENCOUNTER — Encounter: Payer: Self-pay | Admitting: Obstetrics and Gynecology

## 2018-05-30 ENCOUNTER — Other Ambulatory Visit: Payer: Self-pay

## 2018-05-30 VITALS — BP 107/77 | HR 120 | Wt 194.0 lb

## 2018-05-30 DIAGNOSIS — N898 Other specified noninflammatory disorders of vagina: Secondary | ICD-10-CM

## 2018-05-30 DIAGNOSIS — Z3483 Encounter for supervision of other normal pregnancy, third trimester: Secondary | ICD-10-CM

## 2018-05-30 DIAGNOSIS — O26893 Other specified pregnancy related conditions, third trimester: Secondary | ICD-10-CM

## 2018-05-30 DIAGNOSIS — Z3009 Encounter for other general counseling and advice on contraception: Secondary | ICD-10-CM

## 2018-05-30 LAB — POCT URINALYSIS DIPSTICK OB
Bilirubin, UA: NEGATIVE
Blood, UA: NEGATIVE
Glucose, UA: NEGATIVE
Ketones, UA: NEGATIVE
Leukocytes, UA: NEGATIVE
Nitrite, UA: NEGATIVE
POC,PROTEIN,UA: NEGATIVE
Spec Grav, UA: <=1.005 — AB (ref 1.010–1.025)
Urobilinogen, UA: 0.2 E.U./dL
pH, UA: 8 (ref 5.0–8.0)

## 2018-05-30 NOTE — Progress Notes (Signed)
ROB-Pt stated that she is having some bloody discharge and feels like she urinating, but it is not urine. Pt stated that she is still having contractions.

## 2018-05-30 NOTE — Progress Notes (Signed)
ROB: Patient notes possible mucus plug loss ~ 1 week ago.  Notes 2 nights ago when she when to bed she felt like she was leaking fluid. Exam today notes closed cervix, no evidence of ROM. Scant thin white discharge with no odor. Patient denies vaginal itching or burning. Given reassurance. Desires to breast and formula feed.  Is unsure of birth control desires. Given handout.  RTC in 2 weeks.

## 2018-06-13 ENCOUNTER — Other Ambulatory Visit: Payer: Self-pay

## 2018-06-13 ENCOUNTER — Encounter: Payer: Self-pay | Admitting: Obstetrics and Gynecology

## 2018-06-13 ENCOUNTER — Ambulatory Visit (INDEPENDENT_AMBULATORY_CARE_PROVIDER_SITE_OTHER): Payer: Medicaid Other | Admitting: Obstetrics and Gynecology

## 2018-06-13 VITALS — BP 116/82 | HR 116 | Ht 67.0 in | Wt 196.0 lb

## 2018-06-13 DIAGNOSIS — Z3483 Encounter for supervision of other normal pregnancy, third trimester: Secondary | ICD-10-CM

## 2018-06-13 LAB — POCT URINALYSIS DIPSTICK OB
Bilirubin, UA: NEGATIVE
Blood, UA: NEGATIVE
Glucose, UA: NEGATIVE
Ketones, UA: NEGATIVE
Leukocytes, UA: NEGATIVE
Nitrite, UA: NEGATIVE
POC,PROTEIN,UA: NEGATIVE
Spec Grav, UA: 1.01 (ref 1.010–1.025)
Urobilinogen, UA: 0.2 E.U./dL
pH, UA: 7.5 (ref 5.0–8.0)

## 2018-06-13 NOTE — Progress Notes (Signed)
ROB: Has no complaints today.  No further "leakage of fluid".  Patient states she is just "generally tired of being pregnant".  We discussed her recent onset of galactorrhea-normal for pregnancy.

## 2018-06-13 NOTE — Progress Notes (Signed)
Patient comes in today for ROB appointment. She is having some sciatic never pain. She also said that she has started lactating some.

## 2018-07-02 ENCOUNTER — Telehealth: Payer: Self-pay

## 2018-07-02 NOTE — Telephone Encounter (Signed)
Coronavirus (COVID-19) Are you at risk?  Are you at risk for the Coronavirus (COVID-19)?  To be considered HIGH RISK for Coronavirus (COVID-19), you have to meet the following criteria:  . Traveled to [place], [place], [place], [place] or [place]; or in the [place] to [place], [place], [place], or [place]; and have fever, cough, and shortness of breath within the last 2 weeks of travel OR . Been in close contact with a person diagnosed with COVID-19 within the last 2 weeks and have fever, cough, and shortness of breath . IF YOU DO NOT MEET THESE CRITERIA, YOU ARE CONSIDERED LOW RISK FOR COVID-19.  What to do if you are HIGH RISK for COVID-19?  . If you are having a medical emergency, call 911. . Seek medical care right away. Before you go to a doctor's office, urgent care or emergency department, call ahead and tell them about your recent travel, contact with someone diagnosed with COVID-19, and your symptoms. You should receive instructions from your physician's office regarding next steps of care.  . When you arrive at healthcare provider, tell the healthcare staff immediately you have returned from visiting [place], [place], [place], [place] or [place]; or traveled in the [place] to [place], [place], [place], or [place]; in the last two weeks or you have been in close contact with a person diagnosed with COVID-19 in the last 2 weeks.   . Tell the health care staff about your symptoms: fever, cough and shortness of breath. . After you have been seen by a medical provider, you will be either: o Tested for (COVID-19) and discharged home on quarantine except to seek medical care if symptoms worsen, and asked to  - Stay home and avoid contact with others until you get your results (4-5 days)  - Avoid travel on public transportation if possible (such as bus, train, or airplane) or o Sent to the Emergency Department by EMS for evaluation, COVID-19 testing, and possible  admission depending on your condition and test results.  What to do if you are LOW RISK for COVID-19?  Reduce your risk of any infection by using the same precautions used for avoiding the common cold or flu:  . Wash your hands often with soap and warm water for at least 20 seconds.  If soap and water are not readily available, use an alcohol-based hand sanitizer with at least 60% alcohol.  . If coughing or sneezing, cover your mouth and nose by coughing or sneezing into the elbow areas of your shirt or coat, into a tissue or into your sleeve (not your hands). . Avoid shaking hands with others and consider head nods or verbal greetings only. . Avoid touching your eyes, nose, or mouth with unwashed hands.  . Avoid close contact with people who are sick. . Avoid places or events with large numbers of people in one location, like concerts or sporting events. . Carefully consider travel plans you have or are making. . If you are planning any travel outside or inside the [place], visit the CDC's Travelers' Health webpage for the latest health notices. . If you have some symptoms but not all symptoms, continue to monitor at home and seek medical attention if your symptoms worsen. . If you are having a medical emergency, call 911.   ADDITIONAL HEALTHCARE OPTIONS FOR PATIENTS  [place] [place] Tele[facility] / e-Visit: [email or web address]         [facility]: [phone]  [facility]: [phone]                   [facility]: [phone]   

## 2018-07-02 NOTE — Progress Notes (Signed)
ROB-Pt present today for prenatal care and 36 week cultures. Pt stated that she is having a lot of pain and pressure in the vaginal. Pt stated noticing thick vaginal discharge between yellow and pink yellow. Pt stated that she noticed that her feet has been swollen really bad.

## 2018-07-03 ENCOUNTER — Other Ambulatory Visit: Payer: Self-pay

## 2018-07-03 ENCOUNTER — Encounter: Payer: Self-pay | Admitting: Obstetrics and Gynecology

## 2018-07-03 ENCOUNTER — Ambulatory Visit (INDEPENDENT_AMBULATORY_CARE_PROVIDER_SITE_OTHER): Payer: Medicaid Other | Admitting: Obstetrics and Gynecology

## 2018-07-03 VITALS — BP 121/77 | HR 94 | Wt 200.7 lb

## 2018-07-03 DIAGNOSIS — R102 Pelvic and perineal pain: Secondary | ICD-10-CM

## 2018-07-03 DIAGNOSIS — Z3483 Encounter for supervision of other normal pregnancy, third trimester: Secondary | ICD-10-CM

## 2018-07-03 DIAGNOSIS — O26893 Other specified pregnancy related conditions, third trimester: Secondary | ICD-10-CM

## 2018-07-03 LAB — POCT URINALYSIS DIPSTICK OB
Bilirubin, UA: NEGATIVE
Clarity, UA: NEGATIVE
Glucose, UA: NEGATIVE
Ketones, UA: NEGATIVE
Leukocytes, UA: NEGATIVE
Nitrite, UA: NEGATIVE
POC,PROTEIN,UA: NEGATIVE
Spec Grav, UA: 1.015 (ref 1.010–1.025)
Urobilinogen, UA: 0.2 E.U./dL
pH, UA: 7.5 (ref 5.0–8.0)

## 2018-07-03 LAB — OB RESULTS CONSOLE GC/CHLAMYDIA: Gonorrhea: NEGATIVE

## 2018-07-03 NOTE — Progress Notes (Signed)
ROB: Patient notes she is miserable. Noting lots of pressure and pain in the vaginal area. Is taking Tylenol, doing warm baths. Also noting foot swelling. 36 week labs done today. RTC in 1 week. Labor precautions discussed.

## 2018-07-05 LAB — STREP GP B NAA: Strep Gp B NAA: NEGATIVE

## 2018-07-10 ENCOUNTER — Encounter: Payer: Self-pay | Admitting: Obstetrics and Gynecology

## 2018-07-10 ENCOUNTER — Other Ambulatory Visit: Payer: Self-pay

## 2018-07-10 ENCOUNTER — Ambulatory Visit (INDEPENDENT_AMBULATORY_CARE_PROVIDER_SITE_OTHER): Payer: Medicaid Other | Admitting: Obstetrics and Gynecology

## 2018-07-10 VITALS — BP 115/75 | HR 114 | Ht 67.0 in | Wt 202.0 lb

## 2018-07-10 DIAGNOSIS — Z3483 Encounter for supervision of other normal pregnancy, third trimester: Secondary | ICD-10-CM

## 2018-07-10 LAB — POCT URINALYSIS DIPSTICK OB
Bilirubin, UA: NEGATIVE
Blood, UA: NEGATIVE
Glucose, UA: NEGATIVE
Ketones, UA: NEGATIVE
Leukocytes, UA: NEGATIVE
Nitrite, UA: NEGATIVE
POC,PROTEIN,UA: NEGATIVE
Spec Grav, UA: 1.01 (ref 1.010–1.025)
Urobilinogen, UA: 0.2 E.U./dL
pH, UA: 7 (ref 5.0–8.0)

## 2018-07-10 NOTE — Progress Notes (Signed)
ROB: General aches and pains of pregnancy.  No specific complaints.  Occasional contractions.  Signs and symptoms of labor discussed.

## 2018-07-10 NOTE — Progress Notes (Signed)
Patient comes in today for ROB visit. She has no concerns today.  

## 2018-07-16 ENCOUNTER — Other Ambulatory Visit: Payer: Self-pay

## 2018-07-16 ENCOUNTER — Ambulatory Visit (INDEPENDENT_AMBULATORY_CARE_PROVIDER_SITE_OTHER): Payer: Medicaid Other | Admitting: Obstetrics and Gynecology

## 2018-07-16 ENCOUNTER — Encounter: Payer: Self-pay | Admitting: Obstetrics and Gynecology

## 2018-07-16 VITALS — BP 105/72 | HR 106 | Wt 203.0 lb

## 2018-07-16 DIAGNOSIS — N898 Other specified noninflammatory disorders of vagina: Secondary | ICD-10-CM

## 2018-07-16 DIAGNOSIS — O09299 Supervision of pregnancy with other poor reproductive or obstetric history, unspecified trimester: Secondary | ICD-10-CM | POA: Insufficient documentation

## 2018-07-16 DIAGNOSIS — O26893 Other specified pregnancy related conditions, third trimester: Secondary | ICD-10-CM

## 2018-07-16 DIAGNOSIS — Z3483 Encounter for supervision of other normal pregnancy, third trimester: Secondary | ICD-10-CM

## 2018-07-16 DIAGNOSIS — O09293 Supervision of pregnancy with other poor reproductive or obstetric history, third trimester: Secondary | ICD-10-CM

## 2018-07-16 LAB — GC/CHLAMYDIA PROBE AMP
Chlamydia trachomatis, NAA: NEGATIVE
Neisseria Gonorrhoeae by PCR: NEGATIVE

## 2018-07-16 LAB — POCT URINALYSIS DIPSTICK OB
Bilirubin, UA: NEGATIVE
Blood, UA: NEGATIVE
Glucose, UA: NEGATIVE
Ketones, UA: NEGATIVE
Leukocytes, UA: NEGATIVE
Nitrite, UA: NEGATIVE
POC,PROTEIN,UA: NEGATIVE
Spec Grav, UA: <=1.005 — AB (ref 1.010–1.025)
Urobilinogen, UA: 0.2 E.U./dL
pH, UA: 7 (ref 5.0–8.0)

## 2018-07-16 NOTE — Progress Notes (Signed)
ROB: Patient complains of feeling miserable. Having pelvic pain and irregular ctx. Notes discharge that has become heavier, but no odor or itching/burning. Discussed labor precautions. 3rd trimester labs negative. RTC in 1 week.

## 2018-07-16 NOTE — Progress Notes (Signed)
ROB-pt present for prenatal care. Pt stated having lower abd pain, contractions that are not regular, and noticed changed in her vaginal discharge (heavy and thick)

## 2018-07-24 ENCOUNTER — Encounter: Payer: Medicaid Other | Admitting: Obstetrics and Gynecology

## 2018-07-24 ENCOUNTER — Other Ambulatory Visit: Payer: Self-pay

## 2018-07-24 ENCOUNTER — Ambulatory Visit (INDEPENDENT_AMBULATORY_CARE_PROVIDER_SITE_OTHER): Payer: Medicaid Other | Admitting: Obstetrics and Gynecology

## 2018-07-24 VITALS — BP 122/86 | HR 132 | Wt 205.5 lb

## 2018-07-24 DIAGNOSIS — Z3483 Encounter for supervision of other normal pregnancy, third trimester: Secondary | ICD-10-CM

## 2018-07-24 LAB — POCT URINALYSIS DIPSTICK OB
Bilirubin, UA: NEGATIVE
Blood, UA: NEGATIVE
Glucose, UA: NEGATIVE
Ketones, UA: NEGATIVE
Nitrite, UA: NEGATIVE
POC,PROTEIN,UA: NEGATIVE
Spec Grav, UA: <=1.005 — AB (ref 1.010–1.025)
Urobilinogen, UA: 0.2 E.U./dL
pH, UA: 6.5 (ref 5.0–8.0)

## 2018-07-24 NOTE — Progress Notes (Signed)
ROB: Signs and symptoms of labor reviewed.  Patient has occasional contractions but not regular. "  Tired of being pregnant".  Cervix soft but essentially long and closed.  BPP next visit.

## 2018-07-24 NOTE — Progress Notes (Signed)
ROB-Patient c/o intermittent vaginal and pelvic pain and pressure, small amount of bright red vaginal bleeding 4 days ago, c/o mucous vaginal d/c that started 3 days ago.

## 2018-07-30 ENCOUNTER — Other Ambulatory Visit: Payer: Self-pay | Admitting: Obstetrics and Gynecology

## 2018-07-30 DIAGNOSIS — Z3483 Encounter for supervision of other normal pregnancy, third trimester: Secondary | ICD-10-CM

## 2018-07-31 ENCOUNTER — Other Ambulatory Visit: Payer: Self-pay

## 2018-07-31 ENCOUNTER — Ambulatory Visit (INDEPENDENT_AMBULATORY_CARE_PROVIDER_SITE_OTHER): Payer: Medicaid Other | Admitting: Obstetrics and Gynecology

## 2018-07-31 ENCOUNTER — Encounter: Payer: Self-pay | Admitting: Obstetrics and Gynecology

## 2018-07-31 ENCOUNTER — Ambulatory Visit
Admission: RE | Admit: 2018-07-31 | Discharge: 2018-07-31 | Disposition: A | Discharge status: Home / Self Care | Payer: Medicaid Other | Source: Ambulatory Visit | Attending: Obstetrics and Gynecology | Admitting: Obstetrics and Gynecology

## 2018-07-31 VITALS — BP 113/72 | HR 106 | Wt 205.7 lb

## 2018-07-31 DIAGNOSIS — O480 Post-term pregnancy: Secondary | ICD-10-CM

## 2018-07-31 DIAGNOSIS — O26853 Spotting complicating pregnancy, third trimester: Secondary | ICD-10-CM | POA: Diagnosis not present

## 2018-07-31 DIAGNOSIS — Z3483 Encounter for supervision of other normal pregnancy, third trimester: Secondary | ICD-10-CM

## 2018-07-31 DIAGNOSIS — R102 Pelvic and perineal pain: Secondary | ICD-10-CM | POA: Diagnosis not present

## 2018-07-31 DIAGNOSIS — O4693 Antepartum hemorrhage, unspecified, third trimester: Secondary | ICD-10-CM | POA: Diagnosis not present

## 2018-07-31 DIAGNOSIS — Z3A40 40 weeks gestation of pregnancy: Secondary | ICD-10-CM

## 2018-07-31 LAB — POCT URINALYSIS DIPSTICK OB
Bilirubin, UA: NEGATIVE
Blood, UA: NEGATIVE
Glucose, UA: NEGATIVE
Ketones, UA: NEGATIVE
Leukocytes, UA: NEGATIVE
Nitrite, UA: NEGATIVE
POC,PROTEIN,UA: NEGATIVE
Spec Grav, UA: 1.01 (ref 1.010–1.025)
Urobilinogen, UA: 0.2 E.U./dL
pH, UA: 7 (ref 5.0–8.0)

## 2018-07-31 NOTE — Progress Notes (Signed)
Subjective:    Colleen Lang is a 23 y.o. female being seen today for her obstetrical visit. She is at [redacted]w[redacted]d gestation. Baby is moving around. Previous babies were induced, all vaginal births. She's not sure if she'll use epidural- delivered her twins without epidural. She's had some contractions, but they aren't steady. Sometimes they seem to be getting closer together, but not always. They are painful. No vaginal bleeding, discharge, etc.     Menstrual History: OB History    Gravida  3   Para  2   Term  1   Preterm  1   AB      Living  3     SAB      TAB      Ectopic      Multiple  1   Live Births  3            Patient's last menstrual period was 10/24/2017 (exact date).    Review of Systems See HPI  Objective:    BP 113/72   Pulse (!) 106   Wt 93.3 kg   LMP 10/24/2017 (Exact Date)   BMI 32.22 kg/m  FHT: 150 BPM  Uterine Size: 40 cm  Presentations: unsure  Pelvic Exam:              Dilation: 1cm       Effacement: unsure             Station:  unsure    Consistency: soft            Position: unsure     Assessment:   She is [redacted]w[redacted]d gestation with her 4th pregnancy.  Problem List Items Addressed This Visit    None    Visit Diagnoses    Encounter for supervision of other normal pregnancy in third trimester    -  Primary   Relevant Orders   POC Urinalysis Dipstick OB (Completed)      Plan:   Plans for delivery: Vaginal anticipated. NST ordered for Friday 7/24. Induction scheduled for 7/30. Beta strep culture negative 6/24 Counseling: NST, induction   Marin Roberts, PA-S 07/31/18

## 2018-07-31 NOTE — Progress Notes (Signed)
ROB-Pt present for routine prenatal care. Pt stated that she was doing well. No problems.

## 2018-07-31 NOTE — Patient Instructions (Signed)

## 2018-08-01 NOTE — Progress Notes (Signed)
ROB: Patient with no major complaints. Has some irregular contractions. Labor precautions given. Discussed IOL if no delivery by 41 weeks. Scheduled for 7/30 at midnight.  For NST in 2 days.

## 2018-08-02 ENCOUNTER — Inpatient Hospital Stay
Admission: EM | Admit: 2018-08-02 | Discharge: 2018-08-04 | DRG: 807 | Disposition: A | Discharge status: Home / Self Care | Payer: Medicaid Other | Attending: Obstetrics and Gynecology | Admitting: Obstetrics and Gynecology

## 2018-08-02 ENCOUNTER — Ambulatory Visit (INDEPENDENT_AMBULATORY_CARE_PROVIDER_SITE_OTHER): Payer: Medicaid Other | Admitting: Obstetrics and Gynecology

## 2018-08-02 ENCOUNTER — Other Ambulatory Visit: Payer: Medicaid Other

## 2018-08-02 ENCOUNTER — Other Ambulatory Visit: Payer: Self-pay

## 2018-08-02 ENCOUNTER — Encounter: Payer: Self-pay | Admitting: Obstetrics and Gynecology

## 2018-08-02 ENCOUNTER — Inpatient Hospital Stay: Payer: Medicaid Other

## 2018-08-02 VITALS — BP 110/69 | HR 105 | Ht 67.0 in | Wt 206.7 lb

## 2018-08-02 DIAGNOSIS — D649 Anemia, unspecified: Secondary | ICD-10-CM | POA: Diagnosis present

## 2018-08-02 DIAGNOSIS — O288 Other abnormal findings on antenatal screening of mother: Secondary | ICD-10-CM

## 2018-08-02 DIAGNOSIS — O289 Unspecified abnormal findings on antenatal screening of mother: Secondary | ICD-10-CM | POA: Diagnosis present

## 2018-08-02 DIAGNOSIS — O368390 Maternal care for abnormalities of the fetal heart rate or rhythm, unspecified trimester, not applicable or unspecified: Secondary | ICD-10-CM | POA: Diagnosis present

## 2018-08-02 DIAGNOSIS — O409XX0 Polyhydramnios, unspecified trimester, not applicable or unspecified: Secondary | ICD-10-CM | POA: Diagnosis present

## 2018-08-02 DIAGNOSIS — O99013 Anemia complicating pregnancy, third trimester: Secondary | ICD-10-CM | POA: Diagnosis present

## 2018-08-02 DIAGNOSIS — Z87891 Personal history of nicotine dependence: Secondary | ICD-10-CM

## 2018-08-02 DIAGNOSIS — Z1159 Encounter for screening for other viral diseases: Secondary | ICD-10-CM | POA: Diagnosis not present

## 2018-08-02 DIAGNOSIS — O480 Post-term pregnancy: Secondary | ICD-10-CM | POA: Diagnosis not present

## 2018-08-02 DIAGNOSIS — O09299 Supervision of pregnancy with other poor reproductive or obstetric history, unspecified trimester: Secondary | ICD-10-CM

## 2018-08-02 DIAGNOSIS — Z3A40 40 weeks gestation of pregnancy: Secondary | ICD-10-CM

## 2018-08-02 DIAGNOSIS — Z3483 Encounter for supervision of other normal pregnancy, third trimester: Secondary | ICD-10-CM

## 2018-08-02 DIAGNOSIS — O9902 Anemia complicating childbirth: Secondary | ICD-10-CM | POA: Diagnosis present

## 2018-08-02 LAB — SARS CORONAVIRUS 2 BY RT PCR (HOSPITAL ORDER, PERFORMED IN ~~LOC~~ HOSPITAL LAB): SARS Coronavirus 2: NEGATIVE

## 2018-08-02 LAB — TYPE AND SCREEN
ABO/RH(D): A POS
Antibody Screen: NEGATIVE

## 2018-08-02 LAB — CBC
HCT: 31.7 % — ABNORMAL LOW (ref 36.0–46.0)
Hemoglobin: 10 g/dL — ABNORMAL LOW (ref 12.0–15.0)
MCH: 30.1 pg (ref 26.0–34.0)
MCHC: 31.5 g/dL (ref 30.0–36.0)
MCV: 95.5 fL (ref 80.0–100.0)
Platelets: 193 10*3/uL (ref 150–400)
RBC: 3.32 MIL/uL — ABNORMAL LOW (ref 3.87–5.11)
RDW: 13.5 % (ref 11.5–15.5)
WBC: 12.4 10*3/uL — ABNORMAL HIGH (ref 4.0–10.5)
nRBC: 0 % (ref 0.0–0.2)

## 2018-08-02 LAB — RAPID HIV SCREEN (HIV 1/2 AB+AG)
HIV 1/2 Antibodies: NONREACTIVE
HIV-1 P24 Antigen - HIV24: NONREACTIVE

## 2018-08-02 LAB — POCT URINALYSIS DIPSTICK OB
Bilirubin, UA: NEGATIVE
Blood, UA: NEGATIVE
Glucose, UA: NEGATIVE
Ketones, UA: NEGATIVE
Nitrite, UA: NEGATIVE
POC,PROTEIN,UA: NEGATIVE
Spec Grav, UA: <=1.005 — AB (ref 1.010–1.025)
Urobilinogen, UA: 0.2 E.U./dL
pH, UA: 7.5 (ref 5.0–8.0)

## 2018-08-02 MED ORDER — LIDOCAINE HCL (PF) 1 % IJ SOLN
30.0000 mL | INTRAMUSCULAR | Status: AC | PRN
  Administered 2018-08-03: 1.5 mL via SUBCUTANEOUS

## 2018-08-02 MED ORDER — ACETAMINOPHEN 325 MG PO TABS: 650.0000 mg | ORAL_TABLET | ORAL | Status: DC | PRN

## 2018-08-02 MED ORDER — MISOPROSTOL 25 MCG QUARTER TABLET
ORAL_TABLET | ORAL | Status: AC
  Administered 2018-08-02: 23:00:00 25 ug via BUCCAL
  Filled 2018-08-02: qty 1

## 2018-08-02 MED ORDER — OXYTOCIN BOLUS FROM INFUSION
500.0000 mL | Freq: Once | INTRAVENOUS | Status: AC
  Administered 2018-08-03: 500 mL via INTRAVENOUS

## 2018-08-02 MED ORDER — OXYTOCIN 10 UNIT/ML IJ SOLN
INTRAMUSCULAR | Status: AC
  Filled 2018-08-02: qty 2

## 2018-08-02 MED ORDER — MISOPROSTOL 200 MCG PO TABS
ORAL_TABLET | ORAL | Status: AC
  Filled 2018-08-02: qty 4

## 2018-08-02 MED ORDER — TERBUTALINE SULFATE 1 MG/ML IJ SOLN: 0.2500 mg | Freq: Once | INTRAMUSCULAR | Status: DC | PRN

## 2018-08-02 MED ORDER — OXYCODONE-ACETAMINOPHEN 5-325 MG PO TABS: 1.0000 | ORAL_TABLET | ORAL | Status: DC | PRN

## 2018-08-02 MED ORDER — ONDANSETRON HCL 4 MG/2ML IJ SOLN: 4.0000 mg | Freq: Four times a day (QID) | INTRAMUSCULAR | Status: DC | PRN

## 2018-08-02 MED ORDER — OXYCODONE-ACETAMINOPHEN 5-325 MG PO TABS: 2.0000 | ORAL_TABLET | ORAL | Status: DC | PRN

## 2018-08-02 MED ORDER — LACTATED RINGERS IV SOLN
500.0000 mL | INTRAVENOUS | Status: DC | PRN
  Administered 2018-08-03: 500 mL via INTRAVENOUS

## 2018-08-02 MED ORDER — BUTORPHANOL TARTRATE 1 MG/ML IJ SOLN
1.0000 mg | INTRAMUSCULAR | Status: DC | PRN
  Administered 2018-08-03: 03:00:00 1 mg via INTRAVENOUS
  Filled 2018-08-02: qty 1

## 2018-08-02 MED ORDER — LACTATED RINGERS IV SOLN
INTRAVENOUS | Status: DC
  Administered 2018-08-02 – 2018-08-03 (×2): via INTRAVENOUS

## 2018-08-02 MED ORDER — AMMONIA AROMATIC IN INHA
RESPIRATORY_TRACT | Status: AC
  Filled 2018-08-02: qty 10

## 2018-08-02 MED ORDER — MISOPROSTOL 100 MCG PO TABS
25.0000 ug | ORAL_TABLET | ORAL | Status: DC | PRN
  Filled 2018-08-02 (×3): qty 1

## 2018-08-02 MED ORDER — OXYTOCIN 40 UNITS IN NORMAL SALINE INFUSION - SIMPLE MED
2.5000 [IU]/h | INTRAVENOUS | Status: DC
  Filled 2018-08-02: qty 1000

## 2018-08-02 MED ORDER — SOD CITRATE-CITRIC ACID 500-334 MG/5ML PO SOLN: 30.0000 mL | ORAL | Status: DC | PRN

## 2018-08-02 MED ORDER — LIDOCAINE HCL (PF) 1 % IJ SOLN
INTRAMUSCULAR | Status: AC
  Filled 2018-08-02: qty 30

## 2018-08-02 NOTE — Progress Notes (Signed)
NONSTRESS TEST INTERPRETATION  INDICATIONS: Postdates pregnancy  FHR baseline: 155 bpm RESULTS:Reactive COMMENTS: 1 subtle late deceleration, 1 variable deceleration with mild contraction, one late deceleration with strong contraction from baseline to 120s lasting 40 seconds.    PLAN: 1.Will send to L&D for further evaluation and possibly induction.   Rubie Maid, MD [facility]

## 2018-08-02 NOTE — Progress Notes (Signed)
Intrapartum Progress Note  S: Patient feeling some cramping. Notes foley bulb came out after going to the restroom a short while ago.   O: Blood pressure 120/67, pulse (!) 112, temperature 98.6 F (37 C), temperature source Oral, resp. rate 16, height 5\' 7"  (1.702 m), weight 93.4 kg, last menstrual period 10/24/2017. Gen App: NAD, comfortable Abdomen: soft, gravid FHT: baseline 1 bpm.  Accels present.  Decels present (late deceleration at ~ 9:45 pm). moderate in degree variability.   Tocometer: contractions q q 2-3 minutes Cervix: 4.5/40-50/-3 Extremities: Nontender, no edema.   Labs:   Results for orders placed or performed during the hospital encounter of 08/02/18  SARS Coronavirus 2 (CEPHEID - Performed in [place] hospital lab), [facility] Order   Specimen: Nasopharyngeal Swab  Result Value Ref Range   SARS Coronavirus 2 NEGATIVE NEGATIVE  CBC  Result Value Ref Range   WBC 12.4 (H) 4.0 - 10.5 K/uL   RBC 3.32 (L) 3.87 - 5.11 MIL/uL   Hemoglobin 10.0 (L) 12.0 - 15.0 g/dL   HCT 31.7 (L) 36.0 - 46.0 %   MCV 95.5 80.0 - 100.0 fL   MCH 30.1 26.0 - 34.0 pg   MCHC 31.5 30.0 - 36.0 g/dL   RDW 13.5 11.5 - 15.5 %   Platelets 193 150 - 400 K/uL   nRBC 0.0 0.0 - 0.2 %  Rapid HIV screen (HIV 1/2 Ab+Ag) ([facility] Only)  Result Value Ref Range   HIV-1 P24 Antigen - HIV24 NON REACTIVE NON REACTIVE   HIV 1/2 Antibodies NON REACTIVE NON REACTIVE   Interpretation (HIV Ag Ab)      A non reactive test result means that HIV 1 or HIV 2 antibodies and HIV 1 p24 antigen were not detected in the specimen.  OB RESULTS CONSOLE GC/Chlamydia  Result Value Ref Range   Gonorrhea Negative   OB RESULTS CONSOLE Varicella zoster antibody, IgG  Result Value Ref Range   Varicella Immune   Type and screen [facility]  Result Value Ref Range   ABO/RH(D) A POS    Antibody Screen NEG    Sample Expiration      08/05/2018,2359 Performed at [facility], [address],  [place], [place] [address]     Assessment:  1: SIUP at [redacted]w[redacted]d 2. Mild anemia of pregnancy  Plan:  1. Continue IOL, previously with Category II tracing at admission, has improved, no Category 1. Foley bulb out. Given first dose of Cytotec. Will likely AROM and start Pitocin at next check.  2. Anticipate vaginal delivery.    Rubie Maid, MD 08/02/2018 11:10 PM

## 2018-08-02 NOTE — H&P (Signed)
Obstetric History and Physical  Colleen Lang is a 23 y.o. [phone] with IUP at [redacted]w[redacted]d presenting from the office secondary to Category II tracing on NST. Patient states she has been having  Occasional q 15 min contractions, none vaginal bleeding, intact membranes, with active fetal movement.    Prenatal Course Source of Care: [facility] with onset of care at 8 weeks Pregnancy complications or risks: Patient Active Problem List   Diagnosis Date Noted  . Non-reassuring electronic fetal monitoring tracing 08/02/2018  . Prior fetal macrosomia, antepartum 07/16/2018  . History of twin pregnancy in prior pregnancy 12/21/2017  . Sinus tachycardia 01/12/2015  . Menometrorrhagia 08/19/2014  . History of ovarian cyst 08/19/2014   She plans to breastfeed and formula feed.  She desires unsure method for postpartum contraception.   Prenatal labs and studies: ABO, Rh: A/Positive/-- (12/13 1431) Antibody: Negative (12/13 1431) Rubella: 4.56 (12/13 1431) RPR: Non Reactive (12/13 1431)  HBsAg: Negative (12/13 1431)  HIV: Non Reactive (12/13 1431)  WCB:JSEGBTDV (06/24 1202) 1 hr Glucola  normal Genetic screening normal Anatomy US normal   Past Medical History:  Diagnosis Date  . Abnormal uterine bleeding (AUB)    x 4 months  . AR (allergic rhinitis)   . Breast feeding status of mother    breast feeding 61 year old  . History of chlamydia infection 2014  . Ovarian cyst    seen on u/s 6 months ago- per pt they were small  . Pelvic pain in female     Past Surgical History:  Procedure Laterality Date  . KNEE SURGERY     x2  . TONSILLECTOMY    . WISDOM TOOTH EXTRACTION      OB History  Gravida Para Term Preterm AB Living  3 2 1 1   3   SAB TAB Ectopic Multiple Live Births        1 3    # Outcome Date GA Lbr Len/2nd Weight Sex Delivery Anes PTL Lv  3 Current           2A Preterm 04/09/15 [redacted]w[redacted]d / 00:22 2415 g F Vag-Spont None  LIV  2B Preterm 04/09/15 [redacted]w[redacted]d / 00:30 2930  g F Vag-Spont None  LIV  1 Term 2014   4128 g M Vag-Spont   LIV    Social History   Socioeconomic History  . Marital status: Single    Spouse name: Not on file  . Number of children: Not on file  . Years of education: Not on file  . Highest education level: Not on file  Occupational History  . Not on file  Social Needs  . Financial resource strain: Not on file  . Food insecurity    Worry: Not on file    Inability: Not on file  . Transportation needs    Medical: Not on file    Non-medical: Not on file  Tobacco Use  . Smoking status: Former Smoker    Packs/day: 0.25    Types: Cigarettes  . Smokeless tobacco: Former Network engineer and Sexual Activity  . Alcohol use: Not Currently    Comment: occass  . Drug use: No  . Sexual activity: Not Currently    Birth control/protection: None  Lifestyle  . Physical activity    Days per week: Not on file    Minutes per session: Not on file  . Stress: Not on file  Relationships  . Social connections    Talks on phone: Not on  file    Gets together: Not on file    Attends religious service: Not on file    Active member of club or organization: Not on file    Attends meetings of clubs or organizations: Not on file    Relationship status: Not on file  Other Topics Concern  . Not on file  Social History Narrative  . Not on file    Family History  Problem Relation Age of Onset  . Diabetes Paternal Grandmother   . Diabetes Paternal Grandfather   . Thyroid disease Mother   . Healthy Father   . Cancer Neg Hx   . Ovarian cancer Neg Hx   . Breast cancer Neg Hx   . Heart disease Neg Hx     Medications Prior to Admission  Medication Sig Dispense Refill Last Dose  . Doxylamine-Pyridoxine (DICLEGIS) 10-10 MG TBEC Take 2 tablets by mouth at bedtime. If symptoms persist, add one tablet in the morning and one in the afternoon 100 tablet 5 Past Month at Unknown time  . Prenatal Vit-Fe Fumarate-FA (PRENATAL MULTIVITAMIN) TABS tablet  Take 1 tablet by mouth daily at 12 noon.   08/02/2018 at Unknown time    Allergies  Allergen Reactions  . Tramadol Anaphylaxis  . Ultram [Tramadol Hcl] Anaphylaxis    Hard to breathe  . Other Rash    honey    Review of Systems: Negative except for what is mentioned in HPI.  Physical Exam: BP 120/67 (BP Location: Right Arm)   Pulse (!) 112   Temp 98.8 F (37.1 C) (Oral)   Resp 16   Ht 5\' 7"  (1.702 m)   Wt 93.4 kg   LMP 10/24/2017 (Exact Date)   BMI 32.26 kg/m  CONSTITUTIONAL: Well-developed, well-nourished female in no acute distress.  HENT:  Normocephalic, atraumatic, External right and left ear normal. Oropharynx is clear and moist EYES: Conjunctivae and EOM are normal. Pupils are equal, round, and reactive to light. No scleral icterus.  NECK: Normal range of motion, supple, no masses SKIN: Skin is warm and dry. No rash noted. Not diaphoretic. No erythema. No pallor. NEUROLOGIC: Alert and oriented to person, place, and time. Normal reflexes, muscle tone coordination. No cranial nerve deficit noted. PSYCHIATRIC: Normal mood and affect. Normal behavior. Normal judgment and thought content. CARDIOVASCULAR: Normal heart rate noted, regular rhythm RESPIRATORY: Effort and breath sounds normal, no problems with respiration noted ABDOMEN: Soft, nontender, nondistended, gravid. MUSCULOSKELETAL: Normal range of motion. No edema and no tenderness. 2+ distal pulses.  Cervical Exam: Dilatation 1.5-2 cm   Effacement 30%   Station -3   Presentation: cephalic FHT:  Baseline rate 140 bpm   Variability moderate  Accelerations present   Decelerations variable and occasional subtle late deceleration.  Contractions: Every 10-13 mins   Pertinent Labs/Studies:   Results for orders placed or performed in visit on 08/02/18 (from the past 24 hour(s))  POC Urinalysis Dipstick OB     Status: Abnormal   Collection Time: 08/02/18 10:50 AM  Result Value Ref Range   Color, UA light yellow     Clarity, UA clear    Glucose, UA Negative Negative   Bilirubin, UA neg    Ketones, UA neg    Spec Grav, UA <=1.005 (A) 1.010 - 1.025   Blood, UA neg    pH, UA 7.5 5.0 - 8.0   POC,PROTEIN,UA Negative Negative, Trace, Small (1+), Moderate (2+), Large (3+), 4+   Urobilinogen, UA 0.2 0.2 or 1.0 E.U./dL  Nitrite, UA neg    Leukocytes, UA Moderate (2+) (A) Negative   Appearance yellow    Odor       Imaging (08/02/2018):  US OB Limited CLINICAL DATA:  Post-dates.  EXAM: LIMITED OBSTETRIC ULTRASOUND  FINDINGS: Number of Fetuses: 1  Heart Rate:  147 bpm  Movement: No  Presentation: Cephalic  Placental Location: Fundal  Previa: No  Amniotic Fluid (Subjective):  Within normal limits.  AFI: 20.4 cm; fifth percentile = 7.1 cm, ninety-fifth percentile = 21.4 cm.  BPD: 9.8 cm 40 w  1 d  MATERNAL FINDINGS:  Not well seen.  IMPRESSION: Single live intrauterine pregnancy corresponding to 40 weeks 1 day gestation.  Normal amount of the amniotic fluid.  Please note that no fetal movement was noted during real-time examination.  This exam is performed on an emergent basis and does not comprehensively evaluate fetal size, dating, or anatomy; follow-up complete OB US should be considered if further fetal assessment is warranted.  Electronically Signed   By: Ted Mcalpineobrinka  Dimitrova M.D.   On: 08/02/2018 17:13   Assessment : Ila McgillShayla D Blough is a 23 y.o. [id number] at 8553w2d being admitted for induction of labor due to non-reassuring fetal tracing with post-dates pregnancy. AFI at upper limits of normal, 20.4 cm. H/o fetal macrosomia in a prior pregnancy.   Plan: Labor: Induction with foley bulb and Cytotec as ordered as per protocol. Analgesia as needed.  Admission labs ordered including COVID testing.  FWB: Catgory II fetal heart tracing.  GBS negative. Will initiate IVF, have patient lie on left side.  Delivery plan: Hopeful for vaginal delivery   Hildred Laserherry, Lyrical Sowle,  MD [facility]

## 2018-08-02 NOTE — Progress Notes (Signed)
ROB-present for routine prenatal care and NST. Pt stated that she was doing well.

## 2018-08-03 ENCOUNTER — Inpatient Hospital Stay: Payer: Medicaid Other | Admitting: Anesthesiology

## 2018-08-03 DIAGNOSIS — O409XX0 Polyhydramnios, unspecified trimester, not applicable or unspecified: Secondary | ICD-10-CM | POA: Diagnosis present

## 2018-08-03 DIAGNOSIS — O289 Unspecified abnormal findings on antenatal screening of mother: Secondary | ICD-10-CM | POA: Diagnosis present

## 2018-08-03 DIAGNOSIS — O9902 Anemia complicating childbirth: Secondary | ICD-10-CM

## 2018-08-03 MED ORDER — IBUPROFEN 800 MG PO TABS
800.0000 mg | ORAL_TABLET | Freq: Four times a day (QID) | ORAL | Status: DC
  Administered 2018-08-03: 09:00:00 800 mg via ORAL
  Filled 2018-08-03: qty 1

## 2018-08-03 MED ORDER — PHENYLEPHRINE 40 MCG/ML (10ML) SYRINGE FOR IV PUSH (FOR BLOOD PRESSURE SUPPORT)
80.0000 ug | PREFILLED_SYRINGE | INTRAVENOUS | Status: DC | PRN
  Filled 2018-08-03: qty 10

## 2018-08-03 MED ORDER — COCONUT OIL OIL: 1.0000 "application " | TOPICAL_OIL | Status: DC | PRN

## 2018-08-03 MED ORDER — PRENATAL MULTIVITAMIN CH
1.0000 | ORAL_TABLET | Freq: Every day | ORAL | Status: DC
  Administered 2018-08-03 – 2018-08-04 (×2): 1 via ORAL
  Filled 2018-08-03 (×2): qty 1

## 2018-08-03 MED ORDER — ONDANSETRON HCL 4 MG/2ML IJ SOLN: 4.0000 mg | INTRAMUSCULAR | Status: DC | PRN

## 2018-08-03 MED ORDER — EPHEDRINE 5 MG/ML INJ
10.0000 mg | INTRAVENOUS | Status: DC | PRN
  Filled 2018-08-03: qty 2

## 2018-08-03 MED ORDER — DIPHENHYDRAMINE HCL 50 MG/ML IJ SOLN: 12.5000 mg | INTRAMUSCULAR | Status: DC | PRN

## 2018-08-03 MED ORDER — ZOLPIDEM TARTRATE 5 MG PO TABS: 5.0000 mg | ORAL_TABLET | Freq: Every evening | ORAL | Status: DC | PRN

## 2018-08-03 MED ORDER — FERROUS SULFATE 325 (65 FE) MG PO TABS
325.0000 mg | ORAL_TABLET | Freq: Two times a day (BID) | ORAL | Status: DC
  Administered 2018-08-03 – 2018-08-04 (×3): 325 mg via ORAL
  Filled 2018-08-03 (×3): qty 1

## 2018-08-03 MED ORDER — WITCH HAZEL-GLYCERIN EX PADS: 1.0000 "application " | MEDICATED_PAD | CUTANEOUS | Status: DC | PRN

## 2018-08-03 MED ORDER — ONDANSETRON HCL 4 MG PO TABS: 4.0000 mg | ORAL_TABLET | ORAL | Status: DC | PRN

## 2018-08-03 MED ORDER — IBUPROFEN 800 MG PO TABS
800.0000 mg | ORAL_TABLET | Freq: Four times a day (QID) | ORAL | Status: DC
  Administered 2018-08-03 – 2018-08-04 (×4): 800 mg via ORAL
  Filled 2018-08-03 (×4): qty 1

## 2018-08-03 MED ORDER — FENTANYL 2.5 MCG/ML W/ROPIVACAINE 0.15% IN NS 100 ML EPIDURAL (ARMC)
EPIDURAL | Status: DC | PRN
  Administered 2018-08-03: 12 mL/h via EPIDURAL

## 2018-08-03 MED ORDER — LACTATED RINGERS IV SOLN: 500.0000 mL | Freq: Once | INTRAVENOUS | Status: DC

## 2018-08-03 MED ORDER — DIPHENHYDRAMINE HCL 25 MG PO CAPS: 25.0000 mg | ORAL_CAPSULE | Freq: Four times a day (QID) | ORAL | Status: DC | PRN

## 2018-08-03 MED ORDER — BENZOCAINE-MENTHOL 20-0.5 % EX AERO
1.0000 "application " | INHALATION_SPRAY | CUTANEOUS | Status: DC | PRN
  Administered 2018-08-03: 1 via TOPICAL
  Filled 2018-08-03: qty 56

## 2018-08-03 MED ORDER — SIMETHICONE 80 MG PO CHEW: 80.0000 mg | CHEWABLE_TABLET | ORAL | Status: DC | PRN

## 2018-08-03 MED ORDER — DIBUCAINE (PERIANAL) 1 % EX OINT: 1.0000 "application " | TOPICAL_OINTMENT | CUTANEOUS | Status: DC | PRN

## 2018-08-03 MED ORDER — LIDOCAINE-EPINEPHRINE (PF) 1.5 %-1:200000 IJ SOLN
INTRAMUSCULAR | Status: DC | PRN
  Administered 2018-08-03: 3 mL via EPIDURAL

## 2018-08-03 MED ORDER — ACETAMINOPHEN 325 MG PO TABS
650.0000 mg | ORAL_TABLET | ORAL | Status: DC | PRN
  Administered 2018-08-03 – 2018-08-04 (×2): 650 mg via ORAL
  Filled 2018-08-03 (×2): qty 2

## 2018-08-03 MED ORDER — FENTANYL 2.5 MCG/ML W/ROPIVACAINE 0.15% IN NS 100 ML EPIDURAL (ARMC)
EPIDURAL | Status: AC
  Filled 2018-08-03: qty 100

## 2018-08-03 MED ORDER — SENNOSIDES-DOCUSATE SODIUM 8.6-50 MG PO TABS
2.0000 | ORAL_TABLET | ORAL | Status: DC
  Administered 2018-08-04: 11:00:00 2 via ORAL
  Filled 2018-08-03: qty 2

## 2018-08-03 MED ORDER — OXYTOCIN 40 UNITS IN NORMAL SALINE INFUSION - SIMPLE MED: 1.0000 m[IU]/min | INTRAVENOUS | Status: DC

## 2018-08-03 MED ORDER — FENTANYL 2.5 MCG/ML W/ROPIVACAINE 0.15% IN NS 100 ML EPIDURAL (ARMC): 12.0000 mL/h | EPIDURAL | Status: DC

## 2018-08-03 NOTE — Anesthesia Procedure Notes (Signed)
Epidural Patient location during procedure: OB Start time: 08/03/2018 3:47 AM End time: 08/03/2018 4:11 AM  Staffing Performed: anesthesiologist   Preanesthetic Checklist Completed: patient identified, site marked, surgical consent, pre-op evaluation, timeout performed, IV checked, risks and benefits discussed and monitors and equipment checked  Epidural Patient position: sitting Prep: Betadine Patient monitoring: heart rate, continuous pulse ox and blood pressure Approach: midline Location: L4-L5 Injection technique: LOR saline  Needle:  Needle type: Tuohy  Needle gauge: 17 G Needle length: 9 cm and 9 Needle insertion depth: 7 cm Catheter type: closed end flexible Catheter size: 20 Guage Catheter at skin depth: 13 cm Test dose: negative and 1.5% lidocaine with Epi 1:200 K  Assessment Events: blood not aspirated, injection not painful, no injection resistance, negative IV test and no paresthesia  Additional Notes   Patient tolerated the insertion well without complications.Reason for block:procedure for pain

## 2018-08-03 NOTE — Anesthesia Preprocedure Evaluation (Signed)
Anesthesia Evaluation  Patient identified by MRN, date of birth, ID band Patient awake    Reviewed: Allergy & Precautions, NPO status , Unable to perform ROS - Chart review only  History of Anesthesia Complications Negative for: history of anesthetic complications  Airway Mallampati: II       Dental   Pulmonary neg sleep apnea, neg COPD, former smoker,           Cardiovascular (-) hypertension(-) Past MI and (-) CHF (-) dysrhythmias (-) Valvular Problems/Murmurs     Neuro/Psych neg Seizures    GI/Hepatic Neg liver ROS, GERD (with pregancy)  ,  Endo/Other  neg diabetes  Renal/GU negative Renal ROS     Musculoskeletal   Abdominal   Peds  Hematology   Anesthesia Other Findings   Reproductive/Obstetrics (+) Pregnancy                             Anesthesia Physical Anesthesia Plan  ASA: II  Anesthesia Plan: Epidural   Post-op Pain Management:    Induction:   PONV Risk Score and Plan:   Airway Management Planned:   Additional Equipment:   Intra-op Plan:   Post-operative Plan:   Informed Consent: I have reviewed the patients History and Physical, chart, labs and discussed the procedure including the risks, benefits and alternatives for the proposed anesthesia with the patient or authorized representative who has indicated his/her understanding and acceptance.       Plan Discussed with:   Anesthesia Plan Comments:         Anesthesia Quick Evaluation

## 2018-08-03 NOTE — Progress Notes (Signed)
Intrapartum Progress Note  S: Patient with no major complaints.  O: Blood pressure 125/84, pulse 89, temperature 98 F (36.7 C), temperature source Oral, resp. rate 16, height 5\' 7"  (1.702 m), weight 93.4 kg, last menstrual period 10/24/2017. Gen App: NAD, mildly uncomfortable with contractions Abdomen: soft, gravid FHT: baseline 150 bpm.  Accels present.  Decels absent. moderate in degree variability.   Tocometer: contractions q q 2-3 minutes Cervix: 5/50-60/-2 Extremities: Nontender, no edema.   Labs:  No new labs  Assessment:  1: SIUP at [redacted]w[redacted]d 2. Mild anemia of pregnancy  Plan:  1.  AROM'd with clear fluid.  Start Pitocin for augmentation if needed.  2. Anticipate vaginal delivery.    Rubie Maid, MD 08/03/2018 1:24 AM

## 2018-08-04 LAB — CBC
HCT: 31 % — ABNORMAL LOW (ref 36.0–46.0)
Hemoglobin: 9.8 g/dL — ABNORMAL LOW (ref 12.0–15.0)
MCH: 30.2 pg (ref 26.0–34.0)
MCHC: 31.6 g/dL (ref 30.0–36.0)
MCV: 95.7 fL (ref 80.0–100.0)
Platelets: 187 10*3/uL (ref 150–400)
RBC: 3.24 MIL/uL — ABNORMAL LOW (ref 3.87–5.11)
RDW: 13.6 % (ref 11.5–15.5)
WBC: 13.2 10*3/uL — ABNORMAL HIGH (ref 4.0–10.5)
nRBC: 0 % (ref 0.0–0.2)

## 2018-08-04 MED ORDER — IBUPROFEN 800 MG PO TABS: 800.0000 mg | ORAL_TABLET | Freq: Four times a day (QID) | ORAL | 0 refills | Status: DC

## 2018-08-04 MED ORDER — CYCLOBENZAPRINE HCL 10 MG PO TABS: 10.0000 mg | ORAL_TABLET | Freq: Three times a day (TID) | ORAL | 0 refills | Status: DC | PRN

## 2018-08-04 NOTE — Discharge Instructions (Signed)

## 2018-08-04 NOTE — Progress Notes (Addendum)
Post Partum Day # 1, s/p SVD  Subjective: up ad lib, voiding and tolerating PO. Patient does note still feeling some pain in her sacral region, also pain radiates down to her left leg. Notes her lower back is tender to touch on the left. Has been feeling these symptoms prior to pregnancy as well. Has been using Tylenol, Ibuprofen, and heating pads since delivery with only partial resolution. Desires to go home today if possible.   Objective: Temp:  [98 F (36.7 C)-98.4 F (36.9 C)] 98.4 F (36.9 C) (07/26 0720) Pulse Rate:  [82-97] 93 (07/26 0720) Resp:  [18-20] 18 (07/26 0720) BP: (97-122)/(56-77) 108/76 (07/26 0720) SpO2:  [97 %-99 %] 99 % (07/26 0720)  Physical Exam:  General: alert and no distress  Lungs: clear to auscultation bilaterally Breasts: normal appearance, no masses or tenderness Heart: regular rate and rhythm, S1, S2 normal, no murmur, click, rub or gallop Abdomen: soft, non-tender; bowel sounds normal; no masses,  no organomegaly Pelvis: Lochia: appropriate, Uterine Fundus: firm Extremities: DVT Evaluation: No evidence of DVT seen on physical exam. Negative Homan's sign. No cords or calf tenderness. No significant calf/ankle edema.   Recent Labs    08/02/18 1913 08/04/18 0558  HGB 10.0* 9.8*  HCT 31.7* 31.0*    Assessment/Plan: Doing well postpartum.  Circumcision after discharge  Bottle feeding Contraception undecided.  Anemia of pregnancy, asymptomatic. Will treat with PO iron in PNV.  Will try Flexeril for back pain.  Discharge home today   LOS: 2 days   Rubie Maid, MD [facility] 08/04/2018 10:33 AM

## 2018-08-04 NOTE — Anesthesia Postprocedure Evaluation (Signed)
Anesthesia Post Note  Patient: Colleen Lang  Procedure(s) Performed: AN AD HOC LABOR EPIDURAL  Patient location during evaluation: Mother Baby Anesthesia Type: Epidural Level of consciousness: awake and alert Pain management: pain level controlled Vital Signs Assessment: post-procedure vital signs reviewed and stable Respiratory status: spontaneous breathing, nonlabored ventilation and respiratory function stable Cardiovascular status: stable Postop Assessment: no headache, no backache and epidural receding Anesthetic complications: no     Last Vitals:  Vitals:   08/04/18 0008 08/04/18 0720  BP: 122/68 108/76  Pulse: 97 93  Resp: 18 18  Temp: 36.8 C 36.9 C  SpO2: 99% 99%    Last Pain:  Vitals:   08/04/18 0720  TempSrc: Oral  PainSc:                  Durenda Hurt

## 2018-08-04 NOTE — Discharge Summary (Addendum)
OB Discharge Summary     Patient Name: Colleen Lang DOB: July 23, 1995 MRN: [id number]  Date of admission: 08/02/2018 Delivering MD: Rubie Maid   Date of discharge: 08/04/2018  Admitting diagnosis: sent by dr baby heartrate is dropping Intrauterine pregnancy: [redacted]w[redacted]d     Secondary diagnosis:  Active Problems:   Prior fetal macrosomia, antepartum   Non-reassuring electronic fetal monitoring tracing   AFI (amniotic fluid index) increased   Anemia of pregnancy in third trimester  Additional problems: None     Discharge diagnosis: Term Pregnancy Delivered and Anemia                                                                                                Post partum procedures:None  Augmentation: AROM and Cytotec  Complications: None  Hospital course:  Induction of Labor With Vaginal Delivery   23 y.o. yo [id number] at [redacted]w[redacted]d was admitted to the hospital 08/02/2018 for induction of labor.  Indication for induction: Postdates and Non-reassuring fetal tracing.  Patient had an uncomplicated labor course as follows: Membrane Rupture Time/Date: 1:20 AM ,08/03/2018   Intrapartum Procedures: Episiotomy: None [1]                                         Lacerations:  None [1]  Patient had delivery of a Viable infant.  Information for the patient's newborn:  Amire, Gossen [[id number]]  Delivery Method: Vag-Spont    08/03/2018  Details of delivery can be found in separate delivery note.  Patient had a routine postpartum course. Patient is discharged home 08/04/18.  Physical exam  Vitals:   08/03/18 1208 08/03/18 1614 08/04/18 0008 08/04/18 0720  BP: 106/70 (!) 97/56 122/68 108/76  Pulse: 88 82 97 93  Resp: 20 18 18 18   Temp: 98.2 F (36.8 C) 98 F (36.7 C) 98.3 F (36.8 C) 98.4 F (36.9 C)  TempSrc: Oral Oral Oral Oral  SpO2: 97% 98% 99% 99%  Weight:      Height:       General: alert, cooperative and no distress Lochia: appropriate Uterine Fundus: firm Incision:  N/A DVT Evaluation: No evidence of DVT seen on physical exam. Negative Homan's sign. No cords or calf tenderness. Labs: Lab Results  Component Value Date   WBC 13.2 (H) 08/04/2018   HGB 9.8 (L) 08/04/2018   HCT 31.0 (L) 08/04/2018   MCV 95.7 08/04/2018   PLT 187 08/04/2018   CMP Latest Ref Rng & Units 09/25/2017  Glucose 70 - 99 mg/dL 102(H)  BUN 6 - 20 mg/dL 12  Creatinine 0.44 - 1.00 mg/dL 0.82  Sodium 135 - 145 mmol/L 141  Potassium 3.5 - 5.1 mmol/L 3.9  Chloride 98 - 111 mmol/L 108  CO2 22 - 32 mmol/L 26  Calcium 8.9 - 10.3 mg/dL 9.8  Total Protein 6.5 - 8.1 g/dL 7.2  Total Bilirubin 0.3 - 1.2 mg/dL 0.8  Alkaline Phos 38 - 126 U/L 40  AST 15 - 41 U/L 19  ALT  0 - 44 U/L 14    Discharge instruction: per After Visit Summary and "Baby and Me Booklet".  After visit meds:  Allergies as of 08/04/2018      Reactions   Tramadol Anaphylaxis   Ultram [tramadol Hcl] Anaphylaxis   Hard to breathe   Other Rash   honey      Medication List    STOP taking these medications   Doxylamine-Pyridoxine 10-10 MG Tbec Commonly known as: Diclegis     TAKE these medications   cyclobenzaprine 10 MG tablet Commonly known as: FLEXERIL Take 1 tablet (10 mg total) by mouth 3 (three) times daily as needed for muscle spasms.   ibuprofen 800 MG tablet Commonly known as: ADVIL Take 1 tablet (800 mg total) by mouth every 6 (six) hours.   prenatal multivitamin Tabs tablet Take 1 tablet by mouth daily at 12 noon.       Diet: routine diet  Activity: Advance as tolerated. Pelvic rest for 6 weeks.   Outpatient follow up:6 weeks Follow up Appt:No future appointments. Follow up Visit:No follow-ups on file.  Postpartum contraception: Undecided  Newborn Data: Live born female  Birth Weight: 8 lb 14.9 oz (4050 g) APGAR: 6, 9  Newborn Delivery   Birth date/time: 08/03/2018 05:02:00 Delivery type: Vaginal, Spontaneous      Baby Feeding: Bottle Disposition:home with  mother   08/04/2018 Hildred LaserAnika Sherea Liptak, MD

## 2018-08-04 NOTE — Progress Notes (Signed)
Discharge instructions provided.  Pt and sig other verbalize understanding of all instructions and follow-up care.  Pt discharged to home with infant at 1405 on 08/04/18 via wheelchair by RN. Reed Breech, RN 08/04/2018 4:29 PM

## 2018-08-05 LAB — RPR: RPR Ser Ql: NONREACTIVE

## 2018-10-10 DIAGNOSIS — Z885 Allergy status to narcotic agent status: Secondary | ICD-10-CM | POA: Diagnosis not present

## 2018-10-10 DIAGNOSIS — N3000 Acute cystitis without hematuria: Secondary | ICD-10-CM | POA: Diagnosis not present

## 2018-10-10 DIAGNOSIS — R1032 Left lower quadrant pain: Secondary | ICD-10-CM | POA: Diagnosis not present

## 2018-10-10 DIAGNOSIS — N739 Female pelvic inflammatory disease, unspecified: Secondary | ICD-10-CM | POA: Diagnosis not present

## 2018-10-10 DIAGNOSIS — N920 Excessive and frequent menstruation with regular cycle: Secondary | ICD-10-CM | POA: Diagnosis not present

## 2018-10-10 DIAGNOSIS — R102 Pelvic and perineal pain: Secondary | ICD-10-CM | POA: Diagnosis not present

## 2018-10-10 DIAGNOSIS — R1031 Right lower quadrant pain: Secondary | ICD-10-CM | POA: Diagnosis not present

## 2018-10-15 ENCOUNTER — Encounter: Payer: Medicaid Other | Admitting: Obstetrics and Gynecology

## 2018-10-17 ENCOUNTER — Other Ambulatory Visit: Payer: Self-pay

## 2018-10-17 ENCOUNTER — Ambulatory Visit (INDEPENDENT_AMBULATORY_CARE_PROVIDER_SITE_OTHER): Payer: Medicaid Other | Admitting: Obstetrics and Gynecology

## 2018-10-17 ENCOUNTER — Encounter: Payer: Self-pay | Admitting: Obstetrics and Gynecology

## 2018-10-17 VITALS — BP 118/77 | HR 111 | Ht 67.0 in | Wt 184.5 lb

## 2018-10-17 DIAGNOSIS — N912 Amenorrhea, unspecified: Secondary | ICD-10-CM

## 2018-10-17 DIAGNOSIS — H539 Unspecified visual disturbance: Secondary | ICD-10-CM | POA: Diagnosis not present

## 2018-10-17 DIAGNOSIS — G4489 Other headache syndrome: Secondary | ICD-10-CM | POA: Diagnosis not present

## 2018-10-17 DIAGNOSIS — N898 Other specified noninflammatory disorders of vagina: Secondary | ICD-10-CM

## 2018-10-17 DIAGNOSIS — R102 Pelvic and perineal pain: Secondary | ICD-10-CM

## 2018-10-17 MED ORDER — KETOROLAC TROMETHAMINE 10 MG PO TABS: 10.0000 mg | ORAL_TABLET | Freq: Four times a day (QID) | ORAL | 0 refills | Status: DC | PRN

## 2018-10-17 MED ORDER — KETOROLAC TROMETHAMINE 30 MG/ML IJ SOLN
30.0000 mg | Freq: Once | INTRAMUSCULAR | Status: AC
  Administered 2018-10-17: 17:00:00 30 mg via INTRAVENOUS

## 2018-10-17 MED ORDER — METOCLOPRAMIDE HCL 10 MG PO TABS: 10.0000 mg | ORAL_TABLET | Freq: Three times a day (TID) | ORAL | 0 refills | Status: DC | PRN

## 2018-10-17 NOTE — Progress Notes (Signed)
GYNECOLOGY PROGRESS NOTE  Subjective:    Patient ID: Colleen Lang, female    DOB: 1995-08-05, 23 y.o.   MRN: [id number]  HPI  Patient is a 23 y.o. [phone] female who presents for complaints of possible UTI.  She states that she was seen in the ER last week at [facility] due to complaints of urinary frequency, urgency, lower back pain and pelvic pressure.  Was treated with antibiotics (Keflex) for a presumed UTI, however patient reports that her symptoms never resolved (and actually feels like they got a little worse).  She is also reporting vaginal discharge, white, with no odor or itching, but just decreased amount.   She also complains of having a headache that continues to get worse.  Reports that she feels the headache in the front of her head and in the back of her neck. Notes some changes in vision (mostly blurriness).  Symptoms have been ongoing for over 1 week. Headaches not relieved by Tylenol, Ibuprofen.   Lastly, patient states that she has not resumed her menstrual cycle since her delivery 3 months ago. She is not currently breastfeeding.  Had UPT at ER visit last week, was negative.   The following portions of the patient's history were reviewed and updated as appropriate: allergies, current medications, past family history, past medical history, past social history, past surgical history and problem list.  Review of Systems Pertinent items noted in HPI and remainder of comprehensive ROS otherwise negative.   Objective:   Blood pressure 118/77, pulse (!) 111, height 5\' 7"  (1.702 m), weight 184 lb 8 oz (83.7 kg), not currently breastfeeding. General appearance: alert and no distress Abdomen: normal findings: no masses palpable and soft, bowel sounds normal and abnormal findings:  mild tenderness in lower abdomen.  Pelvic: external genitalia normal, rectovaginal septum normal.  Vagina without discharge.  Cervix normal appearing, no lesions and no motion tenderness.  Uterus mobile,  nontender, normal shape and size.  Adnexae non-palpable, nontender bilaterally.  Extremities: extremities normal, atraumatic, no cyanosis or edema Neurologic: Grossly normal    Labs:  Labs reviewed in [place].  Positive for bacterial vaginosis on vaginitis screen, UA with WBCs and few bacteria, trace LE. No nitrites.    Imaging (10/10/2018 performed at [facility]):  Result Narrative  EXAM: [place] ENDOVAGINAL (NON-OB) DATE: 10/10/2018 11:40 AM ACCESSION: [id number] UN DICTATED: 10/10/2018 11:41 AM INTERPRETATION LOCATION: [place]  CLINICAL INDICATION: 23 years old Female with bilateral adnexal TTP, L>R, hx of ovarian cysts, recent pregnancy with spont vag delivery 08/03/2018   TECHNIQUE: Ultrasound views of the pelvis were obtained endovaginally using gray scale and color Doppler imaging. Spectral Doppler was also performed.  COMPARISON: None  FINDINGS:   UTERUS/CERVIX: The uterus is anteverted. No focal myometrial mass was seen. The endometrium was normal in thickness.  OVARIES: The ovaries were seen well transvaginally. Small cystic areas were seen within both ovaries compatible with follicles. Appropriate flow was seen in both ovaries with color and spectral doppler.  OTHER: No abnormal pelvic free fluid.  Other Result Information  Interface, Rad Results In - 10/10/2018 11:55 AM EDT EXAM: [place] ENDOVAGINAL (NON-OB) DATE: 10/10/2018 11:40 AM ACCESSION: [id number] UN DICTATED: 10/10/2018 11:41 AM INTERPRETATION LOCATION: [place]  CLINICAL INDICATION: 23 years old Female with bilateral adnexal TTP, L>R, hx of ovarian cysts, recent pregnancy with spont vag delivery 08/03/2018    TECHNIQUE: Ultrasound views of the pelvis were obtained endovaginally using gray scale and color Doppler imaging. Spectral Doppler was also performed.  COMPARISON: None  FINDINGS:   UTERUS/CERVIX: The uterus is anteverted. No focal myometrial mass was seen.  The endometrium was normal in thickness.   OVARIES: The ovaries were seen well transvaginally. Small cystic areas were seen within both ovaries compatible with follicles.  Appropriate flow was seen in both ovaries with color and spectral  doppler.  OTHER: No abnormal pelvic free fluid.  IMPRESSION: Unremarkable pelvic ultrasound.  Spectral doppler was performed on bilateral ovaries. Additional transabdominal images were not obtained.  3D reconstruction images were not obtained.   Please see below for data measurements:   LMP: recent delivery, no periods since  Uterus: length 9.20 cm; width 6.20 cm; height 4.95 cm Endometrium: 0.11 cm Right ovary: length 4.02 cm; transverse 2.04 cm; AP 1.84 cm Left ovary: length 3.32 cm; transverse 2.12 cm; AP 1.98 cm    Assessment:   Pelvic pain  Vaginal discharge  Amenorrhea  Other headache syndrome  Vision changes   Plan:   1. Pelvic pain - patient recently treated for UTI (with Keflex), however culture was negative.  Was also treated empirically for STIs (given Doxycycline and Flagyl x 14 days), however cultures were negative, so patient notes she discontinued the antibiotics).  Review of ultrasound notes no significant findings. Unsure if pain may be due to incompletely treated BV infection, or due to her amenorrhea (as patient notes cramping like she wants to have a cycle but just never bleeds).  Advised on continued use of OTC pain meds as needed, will also attempt to induce a cycle.  2.  Amenorrhea since delivery 3 months ago, will attempt to induce cycle with OCPs. UPT negative today.  Given a 1 month sample of OCPs today. Advised on Sunday start. 3. Vaginal discharge, no infection noted on wet prep today. Patient may just need to complete her antibiotics for BV infection.  4. Headache (with intermittent visual changes) - no prior h/o migraines. Not relieved with OTC pain meds. Given an injection of Toradol today, will also prescribe Reglan and can take a Benadryl to see if  this helps. Encouraged patient to f/u with an Optometrist to have an eye exam to make sure that her vision has not changed.  If symptoms continue (as well as if her cycle does not resume), may need further evaluation for conditions such as Shehaan's syndrome (however patient did not have postpartum hemorrhage or other risk factors).   Return to clinic if symptoms persist or do not improve.     A total of 25 minutes were spent face-to-face with the patient during this encounter and over half of that time involved counseling and review of medical records.    Hildred Laser, MD [facility]

## 2018-10-17 NOTE — Progress Notes (Signed)
Pt is present today due a follow up after ED visit on 10/10/18 for a possible UTI. Pt stated that she was given Keflex 250mg  x 5days and is still having symptoms. Pt stated lower abd pain with urination; no burning with urination and no back pains.

## 2018-10-19 ENCOUNTER — Encounter: Payer: Self-pay | Admitting: Obstetrics and Gynecology

## 2018-10-19 NOTE — Patient Instructions (Signed)
Pelvic Pain, Female Pelvic pain is pain in your lower belly (abdomen), below your belly button and between your hips. The pain may start suddenly (be acute), keep coming back (be recurring), or last a long time (become chronic). Pelvic pain that lasts longer than 6 months is called chronic pelvic pain. There are many causes of pelvic pain. Sometimes the cause of pelvic pain is not known. Follow these instructions at home:   Take over-the-counter and prescription medicines only as told by your doctor.  Rest as told by your doctor.  Do not have sex if it hurts.  Keep a journal of your pelvic pain. Write down: ? When the pain started. ? Where the pain is located. ? What seems to make the pain better or worse, such as food or your period (menstrual cycle). ? Any symptoms you have along with the pain.  Keep all follow-up visits as told by your doctor. This is important. Contact a doctor if:  Medicine does not help your pain.  Your pain comes back.  You have new symptoms.  You have unusual discharge or bleeding from your vagina.  You have a fever or chills.  You are having trouble pooping (constipation).  You have blood in your pee (urine) or poop (stool).  Your pee smells bad.  You feel weak or light-headed. Get help right away if:  You have sudden pain that is very bad.  Your pain keeps getting worse.  You have very bad pain and also have any of these symptoms: ? A fever. ? Feeling sick to your stomach (nausea). ? Throwing up (vomiting). ? Being very sweaty.  You pass out (lose consciousness). Summary  Pelvic pain is pain in your lower belly (abdomen), below your belly button and between your hips.  There are many possible causes of pelvic pain.  Keep a journal of your pelvic pain. This information is not intended to replace advice given to you by your health care provider. Make sure you discuss any questions you have with your health care provider. Document  Released: 06/14/2007 Document Revised: 06/13/2017 Document Reviewed: 06/13/2017 Elsevier Patient Education  [place] Headache Without Cause A headache is pain or discomfort that is felt around the head or neck area. There are many causes and types of headaches. In some cases, the cause may not be found. Follow these instructions at home: Watch your condition for any changes. Let your doctor know about them. Take these steps to help with your condition: Managing pain      Take over-the-counter and prescription medicines only as told by your doctor.  Lie down in a dark, quiet room when you have a headache.  If told, put ice on your head and neck area: ? Put ice in a plastic bag. ? Place a towel between your skin and the bag. ? Leave the ice on for 20 minutes, 2-3 times per day.  If told, put heat on the affected area. Use the heat source that your doctor recommends, such as a moist heat pack or a heating pad. ? Place a towel between your skin and the heat source. ? Leave the heat on for 20-30 minutes. ? Remove the heat if your skin turns bright red. This is very important if you are unable to feel pain, heat, or cold. You may have a greater risk of getting burned.  Keep lights dim if bright lights bother you or make your headaches worse. Eating and drinking  Eat meals on a regular schedule.  If you drink alcohol: ? Limit how much you use to:  0-1 drink a day for women.  0-2 drinks a day for men. ? Be aware of how much alcohol is in your drink. In the [place] one drink equals one 12 oz bottle of beer (355 mL), one 5 oz glass of wine (148 mL), or one 1 oz glass of hard liquor (44 mL).  Stop drinking caffeine, or reduce how much caffeine you drink. General instructions   Keep a journal to find out if certain things bring on headaches. For example, write down: ? What you eat and drink. ? How much sleep you get. ? Any change to your diet or medicines.   Get a massage or try other ways to relax.  Limit stress.  Sit up straight. Do not tighten (tense) your muscles.  Do not use any products that contain nicotine or tobacco. This includes cigarettes, e-cigarettes, and chewing tobacco. If you need help quitting, ask your doctor.  Exercise regularly as told by your doctor.  Get enough sleep. This often means 7-9 hours of sleep each night.  Keep all follow-up visits as told by your doctor. This is important. Contact a doctor if:  Your symptoms are not helped by medicine.  You have a headache that feels different than the other headaches.  You feel sick to your stomach (nauseous) or you throw up (vomit).  You have a fever. Get help right away if:  Your headache gets very bad quickly.  Your headache gets worse after a lot of physical activity.  You keep throwing up.  You have a stiff neck.  You have trouble seeing.  You have trouble speaking.  You have pain in the eye or ear.  Your muscles are weak or you lose muscle control.  You lose your balance or have trouble walking.  You feel like you will pass out (faint) or you pass out.  You are mixed up (confused).  You have a seizure. Summary  A headache is pain or discomfort that is felt around the head or neck area.  There are many causes and types of headaches. In some cases, the cause may not be found.  Keep a journal to help find out what causes your headaches. Watch your condition for any changes. Let your doctor know about them.  Contact a doctor if you have a headache that is different from usual, or if your headache is not helped by medicine.  Get help right away if your headache gets very bad, you throw up, you have trouble seeing, you lose your balance, or you have a seizure. This information is not intended to replace advice given to you by your health care provider. Make sure you discuss any questions you have with your health care provider. Document  Released: 10/05/2007 Document Revised: 07/16/2017 Document Reviewed: 07/16/2017 Elsevier Patient Education  2020 [employer].

## 2018-11-08 ENCOUNTER — Telehealth: Payer: Self-pay

## 2018-11-08 NOTE — Telephone Encounter (Signed)
HA for a month until last Friday started cycle and HA went away. Cycle ended yesterday and HA back. Please advise. Pt states not taking any birth control.

## 2018-11-11 NOTE — Telephone Encounter (Signed)
Pt called and soon as the phone asked I asked to speak to [facility] and they hung up. I called back the second time and the person on the phone stated that they did not know who I was and hung up.

## 2019-01-28 ENCOUNTER — Telehealth: Payer: Self-pay

## 2019-01-28 NOTE — Telephone Encounter (Signed)
Patient is having bad headaches, lower back pain on right side & clear discharge. Made an appt for Feb/2. Would like a call to address symptoms sooner.

## 2019-01-29 NOTE — Telephone Encounter (Signed)
Pt called no answer 

## 2019-02-03 NOTE — Telephone Encounter (Signed)
Pt was called. Pt stated having headaches that is causing n/v/ pt stated that she has tried Ibuprofen  and tylenol both without any relief and increased discharge. Pt also stated having watery discharge that causes her to wear a panty liner daily.

## 2019-02-03 NOTE — Telephone Encounter (Signed)
Pt called no answer LM via VM to call the office to speak more about her call to the office.  

## 2019-02-04 ENCOUNTER — Encounter: Payer: Self-pay | Admitting: Obstetrics and Gynecology

## 2019-02-04 ENCOUNTER — Ambulatory Visit (INDEPENDENT_AMBULATORY_CARE_PROVIDER_SITE_OTHER): Payer: Medicaid Other | Admitting: Obstetrics and Gynecology

## 2019-02-04 ENCOUNTER — Other Ambulatory Visit: Payer: Self-pay

## 2019-02-04 VITALS — BP 118/83 | HR 97 | Ht 67.0 in | Wt 190.0 lb

## 2019-02-04 DIAGNOSIS — G8929 Other chronic pain: Secondary | ICD-10-CM

## 2019-02-04 DIAGNOSIS — M5441 Lumbago with sciatica, right side: Secondary | ICD-10-CM

## 2019-02-04 DIAGNOSIS — M5442 Lumbago with sciatica, left side: Secondary | ICD-10-CM | POA: Diagnosis not present

## 2019-02-04 DIAGNOSIS — R339 Retention of urine, unspecified: Secondary | ICD-10-CM

## 2019-02-04 DIAGNOSIS — N898 Other specified noninflammatory disorders of vagina: Secondary | ICD-10-CM | POA: Diagnosis not present

## 2019-02-04 DIAGNOSIS — Z3009 Encounter for other general counseling and advice on contraception: Secondary | ICD-10-CM | POA: Diagnosis not present

## 2019-02-04 LAB — POCT URINALYSIS DIPSTICK OB
Bilirubin, UA: NEGATIVE
Blood, UA: NEGATIVE
Glucose, UA: NEGATIVE
Ketones, UA: NEGATIVE
Leukocytes, UA: NEGATIVE
Nitrite, UA: NEGATIVE
POC,PROTEIN,UA: NEGATIVE
Spec Grav, UA: 1.01 (ref 1.010–1.025)
Urobilinogen, UA: 0.2 E.U./dL
pH, UA: 6 (ref 5.0–8.0)

## 2019-02-04 NOTE — Progress Notes (Signed)
Pt stated having lower back pain since her last pregnancy and it has not improved. Pt stated that sometimes it feel like she can not fully empty her bladder. Watery discharge sometimes with odor.

## 2019-02-04 NOTE — Patient Instructions (Addendum)

## 2019-02-04 NOTE — Progress Notes (Signed)
   Subjective:    Colleen Lang is a 24 y.o. female who presents for evaluation of low back pain. The patient has had recurrent self limited episodes of low back pain in the past since the birth of her last child in July 2020, and are gradually worsening.  Onset was related to / precipitated by no known injury. The pain is located in the left sacroiliac area and radiates to the lower abdomen. The pain is described as stabbing and occurs intermittently. She rates her pain as a 8 on a scale of 0-10 when it occurs. Symptoms are exacerbated by lifting, sitting and twisting. Symptoms are improved by acetaminophen, NSAIDs, sleep, stretching and heating pads.  She does occasional sciatic pain in both lower extremities.   Also noting increase in vaginal discharge, mostly clear/watery, but sometimes white.  Also notes some morning sickness symptoms. Not on any form of contraception, but would like to discuss options.    Lastly reporting incomplete bladder emptying. Has been ongoing since the birth of her last child. Had h/o UTI in October.     The following portions of the patient's history were reviewed and updated as appropriate: allergies, current medications, past family history, past medical history, past social history, past surgical history and problem list.  Review of Systems Pertinent items noted in HPI and remainder of comprehensive ROS otherwise negative.    Objective:   Gen App: alert and no distress.  Abdomen: soft, non-distended. Mild suprapubic tenderness.  Pelvis: external genitalia normal, rectovaginal septum normal.  Vagina with small amount of clear discharge.  Cervix normal appearing, no lesions and no motion tenderness.  Uterus mobile, nontender, normal shape and size.  Adnexae non-palpable, nontender bilaterally.  Back: no spasm, no curvature. Mild point tenderness of left lumbo-sacral region.  Neurological: grossly normal. Extremities: Full range of motion without pain, no  tenderness     Labs:  Microscopic wet-mount exam shows negative for pathogens, normal epithelial cells  Results for orders placed or performed in visit on 02/04/19  POC Urinalysis Dipstick OB  Result Value Ref Range   Color, UA yellow    Clarity, UA clear    Glucose, UA Negative Negative   Bilirubin, UA neg    Ketones, UA neg    Spec Grav, UA 1.010 1.010 - 1.025   Blood, UA neg    pH, UA 6.0 5.0 - 8.0   POC,PROTEIN,UA Negative Negative, Trace, Small (1+), Moderate (2+), Large (3+), 4+   Urobilinogen, UA 0.2 0.2 or 1.0 E.U./dL   Nitrite, UA neg    Leukocytes, UA Negative Negative   Appearance yellow;clear    Odor     .  Assessment:   Chronic low back pain Sciatica Vaginal discharge Contraception counseling Incomplete bladder emptying  Plan:    Ice to affected area as needed for local pain relief. Referral to Physical therapy. Offered referral to Chiropractor as well, but may not be covered by patient's insurance.    UA without any evidence of UTI.  May also be helped with physical therapy.  Vaginal discharge normal, no pathogens. May be due to normal hormonal physiology.  Discussed contraception, patient would like to discuss options. Has used Nexplanon and Depo in the past. Discussed all options. Patient would like to try Nexplanon again. Will f/u in 1 week for insertion during expected menses.    Hildred Laser, MD [facility]

## 2019-02-11 ENCOUNTER — Encounter: Payer: Medicaid Other | Admitting: Obstetrics and Gynecology

## 2019-02-12 ENCOUNTER — Encounter: Payer: Self-pay | Admitting: Obstetrics and Gynecology

## 2019-02-12 ENCOUNTER — Ambulatory Visit (INDEPENDENT_AMBULATORY_CARE_PROVIDER_SITE_OTHER): Payer: Medicaid Other | Admitting: Obstetrics and Gynecology

## 2019-02-12 ENCOUNTER — Other Ambulatory Visit: Payer: Self-pay

## 2019-02-12 VITALS — BP 118/76 | HR 80 | Ht 67.0 in | Wt 193.7 lb

## 2019-02-12 DIAGNOSIS — Z30017 Encounter for initial prescription of implantable subdermal contraceptive: Secondary | ICD-10-CM

## 2019-02-12 DIAGNOSIS — Z3202 Encounter for pregnancy test, result negative: Secondary | ICD-10-CM

## 2019-02-12 LAB — POCT URINE PREGNANCY: Preg Test, Ur: NEGATIVE

## 2019-02-12 NOTE — Progress Notes (Signed)
     GYNECOLOGY OFFICE PROCEDURE NOTE  Colleen Lang is a 24 y.o. [id number] here for Nexplanon insertion.  Last pap smear was on 01/17/2018 and was normal.  No other gynecologic concerns. Patient's last menstrual period was 01/14/2019.  Nexplanon Insertion Procedure Patient identified, informed consent performed, consent signed.   Patient does understand that irregular bleeding is a very common side effect of this medication. She was advised to have backup contraception for one week after placement. Pregnancy test in clinic today was negative.  Appropriate time out taken.  Patient's left arm was prepped and draped in the usual sterile fashion. The ruler used to measure and mark insertion area.  Patient was prepped with alcohol swab and then injected with 3 ml of 1% lidocaine.  She was prepped with betadine, Nexplanon removed from packaging,  Device confirmed in needle, then inserted full length of needle and withdrawn per handbook instructions. Nexplanon was able to palpated in the patient's arm; patient palpated the insert herself. There was minimal blood loss.  Patient insertion site covered with guaze and a pressure bandage to reduce any bruising.  The patient tolerated the procedure well and was given post procedure instructions.    EXP: 04/10/2021 LOT: [id number]   Hildred Laser, MD [facility]

## 2019-02-12 NOTE — Progress Notes (Signed)
Pt present for Nexplanon insertion. Pt stated that she was doing well no problems. UPT-neg.

## 2019-02-12 NOTE — Patient Instructions (Signed)
NEXPLANON PLACEMENT POST-PROCEDURE INSTRUCTIONS ° °1. You may take Ibuprofen, Aleve or Tylenol for pain if needed.  Pain should resolve within in 24 hours. ° °2. You may have intercourse after 24 hours.  If you using this for birth control, it is effective immediately. ° °3. You need to call if you have any fever, heavy bleeding, or redness at insertion site. Irregular bleeding is common the first several months after having a Nexplanonplaced. You do not need to call for this reason unless you are concerned. ° °4. Shower or bathe as normal.  You can remove the bandage after 24 hours. ° °

## 2019-02-19 ENCOUNTER — Ambulatory Visit: Payer: Medicaid Other | Attending: Obstetrics and Gynecology

## 2019-04-28 NOTE — Patient Instructions (Signed)
Preventive Care 21-26 Years Old, Female Preventive care refers to visits with your health care provider and lifestyle choices that can promote health and wellness. This includes:  A yearly physical exam. This may also be called an annual well check.  Regular dental visits and eye exams.  Immunizations.  Screening for certain conditions.  Healthy lifestyle choices, such as eating a healthy diet, getting regular exercise, not using drugs or products that contain nicotine and tobacco, and limiting alcohol use. What can I expect for my preventive care visit? Physical exam Your health care provider will check your:  Height and weight. This may be used to calculate body mass index (BMI), which tells if you are at a healthy weight.  Heart rate and blood pressure.  Skin for abnormal spots. Counseling Your health care provider may ask you questions about your:  Alcohol, tobacco, and drug use.  Emotional well-being.  Home and relationship well-being.  Sexual activity.  Eating habits.  Work and work environment.  Method of birth control.  Menstrual cycle.  Pregnancy history. What immunizations do I need?  Influenza (flu) vaccine  This is recommended every year. Tetanus, diphtheria, and pertussis (Tdap) vaccine  You may need a Td booster every 10 years. Varicella (chickenpox) vaccine  You may need this if you have not been vaccinated. Human papillomavirus (HPV) vaccine  If recommended by your health care provider, you may need three doses over 6 months. Measles, mumps, and rubella (MMR) vaccine  You may need at least one dose of MMR. You may also need a second dose. Meningococcal conjugate (MenACWY) vaccine  One dose is recommended if you are age 19-21 years and a first-year college student living in a residence hall, or if you have one of several medical conditions. You may also need additional booster doses. Pneumococcal conjugate (PCV13) vaccine  You may need  this if you have certain conditions and were not previously vaccinated. Pneumococcal polysaccharide (PPSV23) vaccine  You may need one or two doses if you smoke cigarettes or if you have certain conditions. Hepatitis A vaccine  You may need this if you have certain conditions or if you travel or work in places where you may be exposed to hepatitis A. Hepatitis B vaccine  You may need this if you have certain conditions or if you travel or work in places where you may be exposed to hepatitis B. Haemophilus influenzae type b (Hib) vaccine  You may need this if you have certain conditions. You may receive vaccines as individual doses or as more than one vaccine together in one shot (combination vaccines). Talk with your health care provider about the risks and benefits of combination vaccines. What tests do I need?  Blood tests  Lipid and cholesterol levels. These may be checked every 5 years starting at age 20.  Hepatitis C test.  Hepatitis B test. Screening  Diabetes screening. This is done by checking your blood sugar (glucose) after you have not eaten for a while (fasting).  Sexually transmitted disease (STD) testing.  BRCA-related cancer screening. This may be done if you have a family history of breast, ovarian, tubal, or peritoneal cancers.  Pelvic exam and Pap test. This may be done every 3 years starting at age 21. Starting at age 30, this may be done every 5 years if you have a Pap test in combination with an HPV test. Talk with your health care provider about your test results, treatment options, and if necessary, the need for more tests.   Follow these instructions at home: Eating and drinking   Eat a diet that includes fresh fruits and vegetables, whole grains, lean protein, and low-fat dairy.  Take vitamin and mineral supplements as recommended by your health care provider.  Do not drink alcohol if: ? Your health care provider tells you not to drink. ? You are  pregnant, may be pregnant, or are planning to become pregnant.  If you drink alcohol: ? Limit how much you have to 0-1 drink a day. ? Be aware of how much alcohol is in your drink. In the [place] one drink equals one 12 oz bottle of beer (355 mL), one 5 oz glass of wine (148 mL), or one 1 oz glass of hard liquor (44 mL). Lifestyle  Take daily care of your teeth and gums.  Stay active. Exercise for at least 30 minutes on 5 or more days each week.  Do not use any products that contain nicotine or tobacco, such as cigarettes, e-cigarettes, and chewing tobacco. If you need help quitting, ask your health care provider.  If you are sexually active, practice safe sex. Use a condom or other form of birth control (contraception) in order to prevent pregnancy and STIs (sexually transmitted infections). If you plan to become pregnant, see your health care provider for a preconception visit. What's next?  Visit your health care provider once a year for a well check visit.  Ask your health care provider how often you should have your eyes and teeth checked.  Stay up to date on all vaccines. This information is not intended to replace advice given to you by your health care provider. Make sure you discuss any questions you have with your health care provider. Document Revised: 09/06/2017 Document Reviewed: 09/06/2017 Elsevier Patient Education  2020 [employer]. Breast Self-Awareness Breast self-awareness is knowing how your breasts look and feel. Doing breast self-awareness is important. It allows you to catch a breast problem early while it is still small and can be treated. All women should do breast self-awareness, including women who have had breast implants. Tell your doctor if you notice a change in your breasts. What you need:  A mirror.  A well-lit room. How to do a breast self-exam A breast self-exam is one way to learn what is normal for your breasts and to check for changes. To do a  breast self-exam: Look for changes  1. Take off all the clothes above your waist. 2. Stand in front of a mirror in a room with good lighting. 3. Put your hands on your hips. 4. Push your hands down. 5. Look at your breasts and nipples in the mirror to see if one breast or nipple looks different from the other. Check to see if: ? The shape of one breast is different. ? The size of one breast is different. ? There are wrinkles, dips, and bumps in one breast and not the other. 6. Look at each breast for changes in the skin, such as: ? Redness. ? Scaly areas. 7. Look for changes in your nipples, such as: ? Liquid around the nipples. ? Bleeding. ? Dimpling. ? Redness. ? A change in where the nipples are. Feel for changes  1. Lie on your back on the floor. 2. Feel each breast. To do this, follow these steps: ? Pick a breast to feel. ? Put the arm closest to that breast above your head. ? Use your other arm to feel the nipple area of your breast. Feel   the area with the pads of your three middle fingers by making small circles with your fingers. For the first circle, press lightly. For the second circle, press harder. For the third circle, press even harder. ? Keep making circles with your fingers at the different pressures as you move down your breast. Stop when you feel your ribs. ? Move your fingers a little toward the center of your body. ? Start making circles with your fingers again, this time going up until you reach your collarbone. ? Keep making up-and-down circles until you reach your armpit. Remember to keep using the three pressures. ? Feel the other breast in the same way. 3. Sit or stand in the tub or shower. 4. With soapy water on your skin, feel each breast the same way you did in step 2 when you were lying on the floor. Write down what you find Writing down what you find can help you remember what to tell your doctor. Write down:  What is normal for each breast.  Any  changes you find in each breast, including: ? The kind of changes you find. ? Whether you have pain. ? Size and location of any lumps.  When you last had your menstrual period. General tips  Check your breasts every month.  If you are breastfeeding, the best time to check your breasts is after you feed your baby or after you use a breast pump.  If you get menstrual periods, the best time to check your breasts is 5-7 days after your menstrual period is over.  With time, you will become comfortable with the self-exam, and you will begin to know if there are changes in your breasts. Contact a doctor if you:  See a change in the shape or size of your breasts or nipples.  See a change in the skin of your breast or nipples, such as red or scaly skin.  Have fluid coming from your nipples that is not normal.  Find a lump or thick area that was not there before.  Have pain in your breasts.  Have any concerns about your breast health. Summary  Breast self-awareness includes looking for changes in your breasts, as well as feeling for changes within your breasts.  Breast self-awareness should be done in front of a mirror in a well-lit room.  You should check your breasts every month. If you get menstrual periods, the best time to check your breasts is 5-7 days after your menstrual period is over.  Let your doctor know of any changes you see in your breasts, including changes in size, changes on the skin, pain or tenderness, or fluid from your nipples that is not normal. This information is not intended to replace advice given to you by your health care provider. Make sure you discuss any questions you have with your health care provider. Document Revised: 08/14/2017 Document Reviewed: 08/14/2017 Elsevier Patient Education  2020 Elsevier Inc.  

## 2019-04-28 NOTE — Progress Notes (Signed)
Pt present for annual exam. Pt stated that she has had her cycle since 3 days after getting the Nexplanon inserted on 02/12/2019. Pt c/o of cramping, clots, nausea and heavy to light cycles.

## 2019-04-29 ENCOUNTER — Encounter: Payer: Self-pay | Admitting: Obstetrics and Gynecology

## 2019-04-29 ENCOUNTER — Other Ambulatory Visit: Payer: Self-pay

## 2019-04-29 ENCOUNTER — Ambulatory Visit (INDEPENDENT_AMBULATORY_CARE_PROVIDER_SITE_OTHER): Payer: Medicaid Other | Admitting: Obstetrics and Gynecology

## 2019-04-29 VITALS — BP 114/78 | HR 94 | Ht 67.0 in | Wt 187.3 lb

## 2019-04-29 DIAGNOSIS — E282 Polycystic ovarian syndrome: Secondary | ICD-10-CM

## 2019-04-29 DIAGNOSIS — Z975 Presence of (intrauterine) contraceptive device: Secondary | ICD-10-CM | POA: Diagnosis not present

## 2019-04-29 DIAGNOSIS — N921 Excessive and frequent menstruation with irregular cycle: Secondary | ICD-10-CM | POA: Diagnosis not present

## 2019-04-29 DIAGNOSIS — Z01419 Encounter for gynecological examination (general) (routine) without abnormal findings: Secondary | ICD-10-CM

## 2019-04-29 DIAGNOSIS — Z0000 Encounter for general adult medical examination without abnormal findings: Secondary | ICD-10-CM | POA: Diagnosis not present

## 2019-04-29 DIAGNOSIS — R102 Pelvic and perineal pain: Secondary | ICD-10-CM

## 2019-04-29 DIAGNOSIS — R110 Nausea: Secondary | ICD-10-CM

## 2019-04-29 MED ORDER — ONDANSETRON 4 MG PO TBDP: 4.0000 mg | ORAL_TABLET | Freq: Four times a day (QID) | ORAL | 1 refills | Status: DC | PRN

## 2019-04-29 NOTE — Progress Notes (Signed)
GYNECOLOGY ANNUAL PHYSICAL EXAM PROGRESS NOTE  Subjective:    Colleen Lang is a 24 y.o. [id number] female who presents for an annual exam. The patient is sexually active. The patient wears seatbelts: yes. The patient participates in regular exercise: no. Has the patient ever been transfused or tattooed?: no. The patient reports that there is not domestic violence in her life.    The patient has the following complaints today:  1.  Reports persistent daily bleeding with Nexplanon.  Also reporting nausea every morning for the first 1-2 hrs then resolves.   2. Patient continuing to note pelvic pain (however .this has been present for years).    Gynecologic History No LMP recorded. Patient has had an implant.Patient currently still breastfeeding.  Menarche age: 19 Contraception: none History of STI's: Remote h/o chlamydia infection Last Pap:  1/92020.  Results were: normal. Had previous pap smear in 2016  ASCUS, but HPV HR neg.    OB History  Gravida Para Term Preterm AB Living  3 3 2 1  0 4  SAB TAB Ectopic Multiple Live Births  0 0 0 1 4    # Outcome Date GA Lbr Len/2nd Weight Sex Delivery Anes PTL Lv  3 Term 08/03/18 [redacted]w[redacted]d  8 lb 14.9 oz (4.05 kg) M Vag-Spont EPI  LIV     Birth Comments: no anomalies noted at delivery     Name: Debellis,BOY Bristyn     Apgar1: 6  Apgar5: 9  2A Preterm 04/09/15 [redacted]w[redacted]d / 00:22 5 lb 5.2 oz (2.415 kg) F Vag-Spont None  LIV     Name: Baik,GIRLA Brinsley     Apgar1: 9  Apgar5: 9  2B Preterm 04/09/15 [redacted]w[redacted]d / 00:30 6 lb 7.4 oz (2.93 kg) F Vag-Spont None  LIV     Name: Hodsdon,GIRLB Danicka     Apgar1: 9  Apgar5: 9  1 Term 2014   9 lb 1.6 oz (4.128 kg) M Vag-Spont   LIV    Past Medical History:  Diagnosis Date  . AR (allergic rhinitis)   . History of chlamydia infection 2014  . Ovarian cyst    seen on u/s 6 months ago- per pt they were small  . Pelvic pain in female     Past Surgical History:  Procedure Laterality Date  . KNEE SURGERY     x2  .  TONSILLECTOMY    . WISDOM TOOTH EXTRACTION      Family History  Problem Relation Age of Onset  . Diabetes Paternal Grandmother   . Diabetes Paternal Grandfather   . Thyroid disease Mother   . Healthy Father   . Cancer Neg Hx   . Ovarian cancer Neg Hx   . Breast cancer Neg Hx   . Heart disease Neg Hx     Social History   Socioeconomic History  . Marital status: Single    Spouse name: Not on file  . Number of children: Not on file  . Years of education: Not on file  . Highest education level: Not on file  Occupational History  . Not on file  Tobacco Use  . Smoking status: Former Smoker    Packs/day: 0.25    Types: Cigarettes  . Smokeless tobacco: Former 2015 and Sexual Activity  . Alcohol use: Yes    Comment: occass  . Drug use: No  . Sexual activity: Not Currently    Birth control/protection: Implant    Comment: Nexplanon inserted 02/12/2019  Other Topics  Concern  . Not on file  Social History Narrative  . Not on file   Social Determinants of Health   Financial Resource Strain:   . Difficulty of Paying Living Expenses:   Food Insecurity:   . Worried About Charity fundraiser in the Last Year:   . Arboriculturist in the Last Year:   Transportation Needs:   . Film/video editor (Medical):   Marland Kitchen Lack of Transportation (Non-Medical):   Physical Activity:   . Days of Exercise per Week:   . Minutes of Exercise per Session:   Stress:   . Feeling of Stress :   Social Connections:   . Frequency of Communication with Friends and Family:   . Frequency of Social Gatherings with Friends and Family:   . Attends Religious Services:   . Active Member of Clubs or Organizations:   . Attends Archivist Meetings:   Marland Kitchen Marital Status:   Intimate Partner Violence:   . Fear of Current or Ex-Partner:   . Emotionally Abused:   Marland Kitchen Physically Abused:   . Sexually Abused:     Current Outpatient Medications on File Prior to Visit  Medication Sig Dispense  Refill  . etonogestrel (NEXPLANON) 68 MG IMPL implant 1 each by Subdermal route once. Inserted 02/12/2019     No current facility-administered medications on file prior to visit.    Allergies  Allergen Reactions  . Tramadol Anaphylaxis  . Ultram [Tramadol Hcl] Anaphylaxis    Hard to breathe  . Other Rash    honey    Review of Systems Constitutional: negative for chills, fatigue, fevers and sweats Eyes: negative for irritation, redness and visual disturbance Ears, nose, mouth, throat, and face: negative for hearing loss, nasal congestion, snoring and tinnitus Respiratory: negative for asthma, cough, sputum Cardiovascular: negative for chest pain, dyspnea, exertional chest pressure/discomfort, irregular heart beat, palpitations and syncope Gastrointestinal: negative for abdominal pain, change in bowel habits, nausea and vomiting Genitourinary:  Incomplete bladder emptying. Left sided pelvic pain.   Negative for abnormal menstrual periods, genital lesions, sexual problems and vaginal discharge, dysuria and urinary incontinence Integument/breast: negative for breast lump, breast tenderness and nipple discharge Hematologic/lymphatic: negative for bleeding and easy bruising Musculoskeletal:negative for back pain and muscle weakness Neurological: negative for dizziness, headaches, vertigo and weakness Endocrine: negative for diabetic symptoms including polydipsia, polyuria and skin dryness Allergic/Immunologic: negative for hay fever and urticaria       Objective:  Blood pressure 114/78, pulse 94, height 5\' 7"  (1.702 m), weight 187 lb 4.8 oz (85 kg), not currently breastfeeding. Body mass index is 29.34 kg/m.  General Appearance:    Alert, cooperative, no distress, appears stated age  Head:    Normocephalic, without obvious abnormality, atraumatic  Eyes:    PERRL, conjunctiva/corneas clear, EOM's intact, both eyes  Ears:    Normal external ear canals, both ears  Nose:   Nares normal,  septum midline, mucosa normal, no drainage or sinus tenderness  Throat:   Lips, mucosa, and tongue normal; teeth and gums normal  Neck:   Supple, symmetrical, trachea midline, no adenopathy; thyroid: no enlargement/tenderness/nodules; no carotid bruit or JVD  Back:     Symmetric, no curvature, ROM normal, no CVA tenderness  Lungs:     Clear to auscultation bilaterally, respirations unlabored  Chest Wall:    No tenderness or deformity   Heart:    Regular rate and rhythm, S1 and S2 normal, no murmur, rub or gallop  Breast  Exam:    No tenderness, masses, or nipple abnormality  Abdomen:     Soft, non-tender, bowel sounds active all four quadrants, no masses, no organomegaly.    Genitalia:    Pelvic: external genitalia normal, rectovaginal septum normal.  Vagina without discharge.  Cervix normal appearing, no lesions and no motion tenderness.  Uterus mobile, nontender, normal shape and size.  Adnexae non-palpable, nontender bilaterally.   Rectal:    Normal external sphincter.  No hemorrhoids appreciated. Internal exam not done.   Extremities:   Extremities normal, atraumatic, no cyanosis or edema  Pulses:   2+ and symmetric all extremities  Skin:   Skin color, texture, turgor normal, no rashes or lesions  Lymph nodes:   Cervical, supraclavicular, and axillary nodes normal  Neurologic:   CNII-XII intact, normal strength, sensation and reflexes throughout   .  Labs:  Lab Results  Component Value Date   WBC 13.2 (H) 08/04/2018   HGB 9.8 (L) 08/04/2018   HCT 31.0 (L) 08/04/2018   MCV 95.7 08/04/2018   PLT 187 08/04/2018    Lab Results  Component Value Date   CREATININE 0.82 09/25/2017   BUN 12 09/25/2017   NA 141 09/25/2017   K 3.9 09/25/2017   CL 108 09/25/2017   CO2 26 09/25/2017    Lab Results  Component Value Date   ALT 14 09/25/2017   AST 19 09/25/2017   ALKPHOS 40 09/25/2017   BILITOT 0.8 09/25/2017    Lab Results  Component Value Date   TSH 3.118 02/11/2015      Assessment:   Healthy female exam.  Pelvic pain (left-sided) Breakthrough bleeding on Nexplanon Incomplete bladder emptying  Polycystic ovaries  Plan:    - Blood tests: Basic metabolic panel and Comprehensive metabolic panel. - Breast self exam technique reviewed and patient encouraged to perform self-exam monthly. - Contraception: Nexplanon. Currently noting persistent bleeding. Has had in place for 2 months. Reiterated that it is common to have irregular or heavier bleeding for the first few months after Nexplanon insertion. Patient advised on use of NSAIDs, also given samples of combined OCPs to take continuous for 6 weeks. If bleeding continues, can consider other options.  - Discussed healthy lifestyle modifications.  - Pap smear up to date. - Will refer to pelvic floor physical therapy for complaints of persistent pelvic pain and incomplete bladder emptying. Pain began after the birth of her twins and has worsened since the birth of her last child. Patient with a h/o polycystic ovaries. Has had small cysts in the past. Would consider a combined method for birth control but patient not as compliant with non-LARC forms of contraception.  - Follow up in 1 year for annual exam.    Hildred Laser, MD [facility]

## 2019-04-30 LAB — COMPREHENSIVE METABOLIC PANEL
ALT: 17 IU/L (ref 0–32)
AST: 17 IU/L (ref 0–40)
Albumin/Globulin Ratio: 2.4 — ABNORMAL HIGH (ref 1.2–2.2)
Albumin: 5.1 g/dL — ABNORMAL HIGH (ref 3.9–5.0)
Alkaline Phosphatase: 58 IU/L (ref 39–117)
BUN/Creatinine Ratio: 13 (ref 9–23)
BUN: 13 mg/dL (ref 6–20)
Bilirubin Total: 0.5 mg/dL (ref 0.0–1.2)
CO2: 23 mmol/L (ref 20–29)
Calcium: 10 mg/dL (ref 8.7–10.2)
Chloride: 104 mmol/L (ref 96–106)
Creatinine, Ser: 0.97 mg/dL (ref 0.57–1.00)
GFR calc Af Amer: 94 mL/min/{1.73_m2} (ref >59)
GFR calc non Af Amer: 81 mL/min/{1.73_m2} (ref >59)
Globulin, Total: 2.1 g/dL (ref 1.5–4.5)
Glucose: 86 mg/dL (ref 65–99)
Potassium: 4 mmol/L (ref 3.5–5.2)
Sodium: 141 mmol/L (ref 134–144)
Total Protein: 7.2 g/dL (ref 6.0–8.5)

## 2019-04-30 LAB — CBC
Hematocrit: 41.7 % (ref 34.0–46.6)
Hemoglobin: 14.2 g/dL (ref 11.1–15.9)
MCH: 32.1 pg (ref 26.6–33.0)
MCHC: 34.1 g/dL (ref 31.5–35.7)
MCV: 94 fL (ref 79–97)
Platelets: 296 10*3/uL (ref 150–450)
RBC: 4.43 x10E6/uL (ref 3.77–5.28)
RDW: 12.1 % (ref 11.7–15.4)
WBC: 7 10*3/uL (ref 3.4–10.8)

## 2019-05-01 ENCOUNTER — Encounter: Payer: Self-pay | Admitting: Obstetrics and Gynecology

## 2019-06-10 NOTE — Progress Notes (Signed)
Pt present for Nexplanon removal. Pt stated that she is having of irregular cycles. Pt stated having her cycle since she got the Nexplanon.  Pt also c/o of mood charges since getting the Nexplanon. Pt c/o of unable to fully empty her bladder.

## 2019-06-11 ENCOUNTER — Encounter: Payer: Self-pay | Admitting: Obstetrics and Gynecology

## 2019-06-11 ENCOUNTER — Ambulatory Visit (INDEPENDENT_AMBULATORY_CARE_PROVIDER_SITE_OTHER): Payer: Medicaid Other | Admitting: Obstetrics and Gynecology

## 2019-06-11 ENCOUNTER — Other Ambulatory Visit: Payer: Self-pay

## 2019-06-11 VITALS — BP 131/93 | HR 86 | Ht 67.0 in | Wt 189.0 lb

## 2019-06-11 DIAGNOSIS — N921 Excessive and frequent menstruation with irregular cycle: Secondary | ICD-10-CM

## 2019-06-11 DIAGNOSIS — Z3046 Encounter for surveillance of implantable subdermal contraceptive: Secondary | ICD-10-CM | POA: Diagnosis not present

## 2019-06-11 DIAGNOSIS — Z975 Presence of (intrauterine) contraceptive device: Secondary | ICD-10-CM | POA: Diagnosis not present

## 2019-06-11 DIAGNOSIS — R339 Retention of urine, unspecified: Secondary | ICD-10-CM

## 2019-06-11 LAB — POCT URINALYSIS DIPSTICK
Bilirubin, UA: NEGATIVE
Blood, UA: NEGATIVE
Glucose, UA: NEGATIVE
Ketones, UA: NEGATIVE
Leukocytes, UA: NEGATIVE
Nitrite, UA: NEGATIVE
Protein, UA: NEGATIVE
Spec Grav, UA: 1.015 (ref 1.010–1.025)
Urobilinogen, UA: 0.2 E.U./dL
pH, UA: 6 (ref 5.0–8.0)

## 2019-06-11 NOTE — Patient Instructions (Signed)
NEXPLANON PLACEMENT POST-PROCEDURE INSTRUCTIONS  1. You may take Ibuprofen, Aleve or Tylenol for pain if needed.  Pain should resolve within in 24 hours.  2. You may have intercourse after 24 hours.   3. You need to call if you have any fever, heavy bleeding, or redness at insertion site. Irregular bleeding is common the first several months after having a Nexplanonplaced. You do not need to call for this reason unless you are concerned.  4. Shower or bathe as normal.  You can remove the bandage after 24 hours.

## 2019-06-11 NOTE — Progress Notes (Signed)
     GYNECOLOGY OFFICE PROCEDURE NOTE  Tiauna Whisnant Weyers is a 24 y.o. [id number] here for Nexplanon removal.  Patient notes continuous irregular bleeding. Also noting mood changes, no other recent stressors or life changes noted. Last pap smear was on 01/17/2018 and was normal.  Still having incomplete bladder emptying. Has an appointment next week to start pelvic floor physical therapy.   Nexplanon Removal Patient identified, informed consent performed, consent signed.   Appropriate time out taken. Nexplanon site identified.  Area prepped in usual sterile fashon. Two ml of 1% lidocaine was used to anesthetize the area at the distal end of the implant. A small stab incision was made right beside the implant on the distal portion.  The Nexplanon rod was grasped using hemostats and removed without difficulty.  There was minimal blood loss. There were no complications.  3 ml of 1% lidocaine was injected around the incision for post-procedure analgesia.  Steri-strips were applied over the small incision.  A pressure bandage was applied to reduce any bruising.  The patient tolerated the procedure well and was given post procedure instructions.  Patient is planning to take a hormonal contraceptive break for 1-2 months. Discussed use of barrier methods until new contraceptive method decided on. Briefly discussed options, handouts given on low-dose hormonal as well as non-hormonal IUD. Does not desire conception at this time.     Hildred Laser, MD [facility]

## 2019-06-16 ENCOUNTER — Ambulatory Visit: Payer: Medicaid Other

## 2019-06-23 ENCOUNTER — Ambulatory Visit: Payer: Medicaid Other | Attending: Obstetrics and Gynecology

## 2019-06-23 ENCOUNTER — Other Ambulatory Visit: Payer: Self-pay

## 2019-06-23 DIAGNOSIS — M62838 Other muscle spasm: Secondary | ICD-10-CM | POA: Diagnosis not present

## 2019-06-23 DIAGNOSIS — R293 Abnormal posture: Secondary | ICD-10-CM | POA: Diagnosis not present

## 2019-06-23 DIAGNOSIS — M533 Sacrococcygeal disorders, not elsewhere classified: Secondary | ICD-10-CM | POA: Diagnosis not present

## 2019-06-23 NOTE — Therapy (Signed)
[place] [facility] MAIN [facility] SERVICES [address] [place], [name], [address] Phone: [phone]   Fax:  [phone]  Physical Therapy Evaluation  The patient has been informed of current processes in place at Outpatient Rehab to protect patients from Covid-19 exposure including social distancing, schedule modifications, and new cleaning procedures. After discussing their particular risk with a therapist based on the patient's personal risk factors, the patient has decided to proceed with in-person therapy.   Patient Details  Name: Colleen Lang MRN: [id number] Date of Birth: 11/09/1995 No data recorded  Encounter Date: 06/23/2019   PT End of Session - 06/23/19 1309    Visit Number 1    Number of Visits 10    Date for PT Re-Evaluation 09/01/19    Authorization Type MCAID    Authorization Time Period 06/23/19 through 07/21/19    Authorization - Visit Number 1    Authorization - Number of Visits 4    Progress Note Due on Visit 10    PT Start Time 1030    PT Stop Time 1130    PT Time Calculation (min) 60 min    Activity Tolerance Patient tolerated treatment well;No increased pain    Behavior During Therapy WFL for tasks assessed/performed           Past Medical History:  Diagnosis Date  . AR (allergic rhinitis)   . History of chlamydia infection 2014  . Ovarian cyst    seen on u/s 6 months ago- per pt they were small  . Pelvic pain in female     Past Surgical History:  Procedure Laterality Date  . KNEE SURGERY     x2  . TONSILLECTOMY    . WISDOM TOOTH EXTRACTION      There were no vitals filed for this visit.  Pelvic Floor Physical Therapy Evaluation and Assessment  SCREENING  Falls in last 6 mo: no  Red Flags:  Have you had any night sweats? no Unexplained weight loss? no Saddle anesthesia? no Unexplained changes in bowel or bladder habits? no  SUBJECTIVE  Patient reports: Not sure when it started but it definately got  worse after delivery of her son. Is peeing all the time and does not feel much come out and has to push too get it out. Needs to urinate ~ 30 min. Between urinating. Back pain is more on the L side, thought it was "just sciatica" and it started before her recent pregnancy but intensified during her pregnancy.  Reports she sometimes has pain near umbilicus/slightly above and that she gets an "antsy" feeling sometimes and can have a hared time going to sleep.  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  Gynecological History: H/o chlamydia and ovarian cysts.  Urinary History: Difficulty emptying, urinary frequency ~ every 30 min. Drops of SUI with cough/sneeze.  Drinking 3-4 ~ 17 oz.bottles per day, ~ 2 cans of Dr. Reino Kent, ~ 24 oz. Of coffee each morning.  Gastrointestinal History: Has painful sensation in the pelvis while having a BM, ~ every-other day. Not straining. BM consistency varies a lot.   Eating a lot of Chicken, vegetables.  Sexual activity/pain: Has had pain before when she had a cyst, does not think that it would be painful now but has not tried recently.  Location of pain:  B hips and lower abdomen (low back) Current pain:  0/10 (0/10) Max pain:  8/10 (8/10) Least pain:  0/10 (0/10) Nature of pain: stabbing/aching  Patient Goals: To know what is going on and be able to pee less often, not have pain in the LB or pelvis with standing/walking for ~ 1 hr.   OBJECTIVE  Posture/Observations:  Sitting: legs crossed Standing: RLE turned out, R ASIS low, hyperkyphosis, R PSIS low   Palpation/Segmental Motion/Joint Play: TTP to R Psoas and iliacus > L iliacus Special tests:     Range of Motion/Flexibilty:  Spine: R SB ~ 2 fingers from knee, pain in L hip-flexor, L SB ~ 1 finger from knee,  pain in R erectors. ROT WNL B but cramping in R mid-back with R ROT and in L hip-flexor with L rot. R handed, shoulders level Hips:   Strength/MMT:  LE MMT  LE MMT Left Right  Hip flex:  (L2) /5 /5  Hip ext: /5 /5  Hip abd: /5 /5  Hip add: /5 /5  Hip IR /5 /5  Hip ER /5 /5     Abdominal:  Palpation: Diastasis:  Pelvic Floor External Exam: Introitus Appears:  Skin integrity:  Palpation: Cough: Prolapse visible?: Scar mobility:  Internal Vaginal Exam: Strength (PERF):  Symmetry: Palpation: Prolapse:   Internal Rectal Exam: Strength (PERF): Symmetry: Palpation: Prolapse:   Gait Analysis:   Pelvic Floor Outcome Measures: FOTO PFDIPain: 13, Urinary Problem: 60, PFDI Urinary:13   INTERVENTIONS THIS SESSION: Self-care: Educated on the structure and function of the pelvic floor in relation to their symptoms as well as the POC, and initial HEP in order to set patient expectations and understanding from which we will build on in the future sessions. Educated on and practiced diaphragmatic breathing to allow for maximal efficacy at follow-up appointments and to begin to down-regulate the nervous system.  Total time: 60                    Objective measurements completed on examination: See above findings.                 PT Short Term Goals - 06/23/19 1254      PT SHORT TERM GOAL #1   Title Patient will demonstrate improved pelvic alignment and balance of musculature surrounding the pelvis to facilitate decreased PFM spasms and decrease pelvic pain.    Baseline R anterior innominate rotation, L up-slip    Time 5    Period Weeks    Status New    Target Date 07/28/19      PT SHORT TERM GOAL #2   Title Patient will report consistent use of foot-stool (squatty-potty) for positioning with BM to decrease pain with BM and intra-abdominal pressure.    Baseline Pt. having to wait a long times occasionally and has BM ~ every-other day with  variable consisteency. Pain during BM    Time 5    Period Weeks    Status New    Target Date 07/28/19      PT SHORT TERM GOAL #3   Title Pt. will demonstrate implementation of behavioral modifications such as fluid intake and use of Urge suppression technique to allow for decreased UUI/frequency.    Baseline Pt. urinating ~ every 30 min, sometimes only 5-10 min. later.    Time 4    Period Weeks    Status New    Target Date 07/28/19  PT Long Term Goals - 06/23/19 1258      PT LONG TERM GOAL #1   Title Patient will report no episodes of SUI over the course of the prior two weeks to demonstrate improved functional ability.    Baseline SUI with cough/sneeze    Time 10    Period Weeks    Status New    Target Date 09/01/19      PT LONG TERM GOAL #2   Title Patient will describe pain no greater than 2/0 after doing ~ 1 hr of physical activity such as cleaning, walking, playing with kids, etc. demonstrate improved functional ability.    Baseline Pt. has increased LBP and pelvic pain with walking and standing with a high of 8/10    Time 10    Period Weeks    Status New    Target Date 09/01/19      PT LONG TERM GOAL #3   Title Patient will report having BM's at least every-other day with consistency between [facility] stool scale 3-5 and no pain over the prior week to demonstrate decreased constipation.    Baseline Pt having BM's every-other day but consistency varies and it can be painful.    Time 10    Period Weeks    Status New    Target Date 09/01/19      PT LONG TERM GOAL #4   Title Pt. will improve in FOTO score by 10 points (or maximal score) in each category to demonstrate improved function.    Baseline FOTO PFDIPain: 13, Urinary Problem: 60, PFDI Urinary:13    Time 10    Period Weeks    Status New    Target Date 09/01/19                  Plan - 06/23/19 1245    Clinical Impression Statement Pt. is a 24 y/o female who presents today with cheif c/o  urinary frequency/urgency, pelvic and LBP as well as mild SUI. Her PMH is significant for 3 pregnancies, one being twins with vaginal deliveries each time and tearing with the first. Her Clinical exam revealed a R anterior rotation and L up-slip as well as breathing dysfunction and spasms through the diaphragm, R>L  hip-flexors, L QL, and B posterior hips as well as hyper kyphosis/lordosis and a 1 finder diastasis recti. SHe will benefit from skilled pelvic health PT to address the noted deficits and to assess for and address any other potential causes of Sx.    Personal Factors and Comorbidities Time since onset of injury/illness/exacerbation    Examination-Activity Limitations Caring for Others;Continence;Locomotion Level    Examination-Participation Restrictions Yard Work;Cleaning;Community Activity    Stability/Clinical Decision Making Evolving/Moderate complexity    Clinical Decision Making Moderate    Rehab Potential Good    PT Frequency 1x / week    PT Duration Other (comment)   10 weeks   PT Treatment/Interventions ADLs/Self Care Home Management;Biofeedback;Moist Heat;Electrical Stimulation;Traction;Ultrasound;Therapeutic activities;Functional mobility training;Stair training;Gait training;Therapeutic exercise;Neuromuscular re-education;Patient/family education;Manual techniques;Scar mobilization;Taping;Dry needling;Passive range of motion;Spinal Manipulations;Joint Manipulations;Visual/perceptual remediation/compensation    PT Next Visit Plan Perform TP release vs. TPDN to L>R diaphragm, R iliacus, L QL, and correct for R anterior rotation/L up-slip. give hip-flexor, side, and LB stretches.    PT Home Exercise Plan diaphragmatic breathin.    Consulted and Agree with Plan of Care Patient           Patient will benefit from skilled therapeutic intervention in order to improve the following  deficits and impairments:  Increased muscle spasms, Decreased range of motion, Decreased scar mobility,  Improper body mechanics, Pain, Postural dysfunction, Impaired flexibility, Increased fascial restricitons, Decreased strength, Decreased coordination  Visit Diagnosis: Sacrococcygeal disorders, not elsewhere classified - Plan: PT plan of care cert/re-cert  Other muscle spasm - Plan: PT plan of care cert/re-cert  Abnormal posture - Plan: PT plan of care cert/re-cert     Problem List Patient Active Problem List   Diagnosis Date Noted  . AFI (amniotic fluid index) increased 08/03/2018  . Non-reassuring electronic fetal monitoring tracing 08/02/2018  . Anemia of pregnancy in third trimester 08/02/2018  . Prior fetal macrosomia, antepartum 07/16/2018  . History of twin pregnancy in prior pregnancy 12/21/2017  . Sinus tachycardia 01/12/2015  . Menometrorrhagia 08/19/2014  . History of ovarian cyst 08/19/2014   Cleophus Molt DPT, ATC Cleophus Molt 06/23/2019, 1:11 PM  [place] [facility] MAIN [facility] SERVICES [address] [place], [name], [address] Phone: [phone]   Fax:  [phone]  Name: Colleen Lang MRN: [id number] Date of Birth: September 28, 1995

## 2019-06-23 NOTE — Patient Instructions (Signed)
Stabilization: Diaphragmatic Breathing    Lie with knees bent, feet flat. Place one hand on stomach, other on chest. Breathe deeply through nose, lifting belly hand without any motion of hand on chest. Do this for at least 5 min. Per night and through the day when you feel your stress levels increase or when you feel the urge to pee.

## 2019-06-30 ENCOUNTER — Other Ambulatory Visit: Payer: Self-pay

## 2019-06-30 ENCOUNTER — Ambulatory Visit: Payer: Medicaid Other

## 2019-06-30 DIAGNOSIS — R293 Abnormal posture: Secondary | ICD-10-CM | POA: Diagnosis not present

## 2019-06-30 DIAGNOSIS — M62838 Other muscle spasm: Secondary | ICD-10-CM | POA: Diagnosis not present

## 2019-06-30 DIAGNOSIS — M533 Sacrococcygeal disorders, not elsewhere classified: Secondary | ICD-10-CM | POA: Diagnosis not present

## 2019-06-30 NOTE — Therapy (Signed)
[place] MAIN [facility] SERVICES [address] [place], [place], [address] Phone: [phone]   Fax:  [phone]  Physical Therapy Treatment  The patient has been informed of current processes in place at Outpatient Rehab to protect patients from Covid-19 exposure including social distancing, schedule modifications, and new cleaning procedures. After discussing their particular risk with a therapist based on the patient's personal risk factors, the patient has decided to proceed with in-person therapy.   Patient Details  Name: BELENDA ALVIAR MRN: [id number] Date of Birth: [birth year] No data recorded  Encounter Date: 06/30/2019   PT End of Session - 07/02/19 1312    Visit Number 2    Number of Visits 10    Date for PT Re-Evaluation 09/01/19    Authorization Type MCAID    Authorization Time Period 06/23/19 through 07/21/19    Authorization - Visit Number 2    Authorization - Number of Visits 4    Progress Note Due on Visit 10    PT Start Time 1030    PT Stop Time 1130    PT Time Calculation (min) 60 min    Activity Tolerance Patient tolerated treatment well;No increased pain    Behavior During Therapy WFL for tasks assessed/performed           Past Medical History:  Diagnosis Date  . AR (allergic rhinitis)   . History of chlamydia infection 2014  . Ovarian cyst    seen on u/s 6 months ago- per pt they were small  . Pelvic pain in female     Past Surgical History:  Procedure Laterality Date  . KNEE SURGERY     x2  . TONSILLECTOMY    . WISDOM TOOTH EXTRACTION      There were no vitals filed for this visit.   Pelvic Floor Physical Therapy Treatment Note  SCREENING  Changes in medications, allergies, or medical history?: none    SUBJECTIVE  Patient reports: No change  Precautions:  ~ 11 months PP  Sexual activity/pain: Has had pain before when she had a cyst, does not think that it would be painful now but has not  tried recently.  Location of pain: B hips and lower abdomen (low back) Current pain: 7/10 (3/10) Max pain: 8/10 (8/10) Least pain: 0/10 (0/10) Nature of pain:stabbing/aching  **"feels a lot better" following treatment.  Patient Goals: To know what is going on and be able to pee less often, not have pain in the LB or pelvis with standing/walking for ~ 1 hr.  OBJECTIVE  Changes in: Posture/Observations:  R anterior rotation, L up-slip  Range of Motion/Flexibilty:  Decreased side-bending ROM to R>L, and decreased hip EXT on R>L.  Strength/MMT:  LE MMT:  Pelvic floor:  Abdominal:   Palpation: TTP to L QL, R Iliacus   Gait Analysis:  INTERVENTIONS THIS SESSION: Manual: Performed TP release to L QL, and R Iliacus followed by MET for R anterior correcton x2 and L up-slip correction to decrease spasm and pain and allow for improved balance of musculature and pelvic alignment for improved function and decreased symptoms. Therex: Educated on and practiced hip-flexor proposal pose stretch, side-stretch, and MET correction for R anterior rotation To maintain and improve muscle length and allow for improved balance of musculature and pelvic alignment for long-term symptom relief.  Total time: 60 min.  PT Short Term Goals - 06/23/19 1254      PT SHORT TERM GOAL #1   Title Patient will demonstrate improved pelvic alignment and balance of musculature surrounding the pelvis to facilitate decreased PFM spasms and decrease pelvic pain.    Baseline R anterior innominate rotation, L up-slip    Time 5    Period Weeks    Status New    Target Date 07/28/19      PT SHORT TERM GOAL #2   Title Patient will report consistent use of foot-stool (squatty-potty) for positioning with BM to decrease pain with BM and intra-abdominal pressure.    Baseline Pt. having to wait a long times occasionally and has BM ~ every-other day with  variable consisteency. Pain during BM    Time 5    Period Weeks    Status New    Target Date 07/28/19      PT SHORT TERM GOAL #3   Title Pt. will demonstrate implementation of behavioral modifications such as fluid intake and use of Urge suppression technique to allow for decreased UUI/frequency.    Baseline Pt. urinating ~ every 30 min, sometimes only 5-10 min. later.    Time 4    Period Weeks    Status New    Target Date 07/28/19             PT Long Term Goals - 06/23/19 1258      PT LONG TERM GOAL #1   Title Patient will report no episodes of SUI over the course of the prior two weeks to demonstrate improved functional ability.    Baseline SUI with cough/sneeze    Time 10    Period Weeks    Status New    Target Date 09/01/19      PT LONG TERM GOAL #2   Title Patient will describe pain no greater than 2/0 after doing ~ 1 hr of physical activity such as cleaning, walking, playing with kids, etc. demonstrate improved functional ability.    Baseline Pt. has increased LBP and pelvic pain with walking and standing with a high of 8/10    Time 10    Period Weeks    Status New    Target Date 09/01/19      PT LONG TERM GOAL #3   Title Patient will report having BM's at least every-other day with consistency between [facility] stool scale 3-5 and no pain over the prior week to demonstrate decreased constipation.    Baseline Pt having BM's every-other day but consistency varies and it can be painful.    Time 10    Period Weeks    Status New    Target Date 09/01/19      PT LONG TERM GOAL #4   Title Pt. will improve in FOTO score by 10 points (or maximal score) in each category to demonstrate improved function.    Baseline FOTO PFDIPain: 13, Urinary Problem: 60, PFDI Urinary:13    Time 10    Period Weeks    Status New    Target Date 09/01/19                 Plan - 07/02/19 1312    Clinical Impression Statement Pt. Responded well to all interventions today,  demonstrating improved pelvic alignment, decreased spasm and pain, as well as understanding and correct performance of all education and exercises provided today. They will continue to benefit from skilled physical therapy to work toward remaining goals and maximize  function as well as decrease likelihood of symptom increase or recurrence.    PT Next Visit Plan Perform TP release vs. TPDN to L>R diaphragm, R iliacus, L QL, and correct for R anterior rotation/L up-slip. give hip-flexor, side, and LB stretches.    PT Home Exercise Plan diaphragmatic breathing, side-stretch, proposal pose stretch, MET for R anterior rotation.    Consulted and Agree with Plan of Care Patient           Patient will benefit from skilled therapeutic intervention in order to improve the following deficits and impairments:     Visit Diagnosis: Sacrococcygeal disorders, not elsewhere classified  Other muscle spasm  Abnormal posture     Problem List Patient Active Problem List   Diagnosis Date Noted  . AFI (amniotic fluid index) increased 08/03/2018  . Non-reassuring electronic fetal monitoring tracing 08/02/2018  . Anemia of pregnancy in third trimester 08/02/2018  . Prior fetal macrosomia, antepartum 07/16/2018  . History of twin pregnancy in prior pregnancy 12/21/2017  . Sinus tachycardia 01/12/2015  . Menometrorrhagia 08/19/2014  . History of ovarian cyst 08/19/2014   Willa Rough DPT, ATC Willa Rough 07/02/2019, 1:19 PM  [place] MAIN [address] [facility] SERVICES [address] [place], [place], [address] Phone: [phone]   Fax:  [phone]  Name: BROWNIE GOCKEL MRN: [id number] Date of Birth: 1995/08/21

## 2019-06-30 NOTE — Patient Instructions (Signed)
° °  Hold for 30 seconds (5 deep breaths) and repeat 2-3 times on each side once a day   Flexors, Lunge  Hip Flexor Stretch: Proposal Pose    Maintain pelvic tuck under, lift pubic bone toward navel. Engage posterior hip muscles (firm glute muscles of leg in back position) and shift forward until you feel stretch on front of leg that is down. To increase stretch, maintain balance and ease hips forward. You may use one hand on a chair for balance if needed. Hold for __5__ breaths. Repeat __2-3__ times each leg.  Do _1-2__ times per day.  BELOW ONLY IF YOU HAVE PAIN:  Pelvic Rotation: Contract / Relax (Supine)  MET to Correct Right Anteriorly Rotated/Left Posteriorly Rotated Innominate   Begin laying on your back with your feet at 90 degrees. Put a dowel/broomstick  through your legs, behind your right knee and in front of your left knee. Stabilize the dowel on ether side with your hands.  Press down with the right leg and up with the left leg. Hold for 5 seconds  then slowly relax. Repeat 5 times.

## 2019-07-07 ENCOUNTER — Ambulatory Visit: Payer: Medicaid Other

## 2019-07-07 ENCOUNTER — Other Ambulatory Visit: Payer: Self-pay

## 2019-07-07 DIAGNOSIS — M533 Sacrococcygeal disorders, not elsewhere classified: Secondary | ICD-10-CM | POA: Diagnosis not present

## 2019-07-07 DIAGNOSIS — M62838 Other muscle spasm: Secondary | ICD-10-CM | POA: Diagnosis not present

## 2019-07-07 DIAGNOSIS — R293 Abnormal posture: Secondary | ICD-10-CM | POA: Diagnosis not present

## 2019-07-07 NOTE — Therapy (Signed)
[place] [facility] MAIN [facility] SERVICES [address] [place], [name], [address] Phone: [phone]   Fax:  [phone]  Physical Therapy Evaluation  The patient has been informed of current processes in place at Outpatient Rehab to protect patients from Covid-19 exposure including social distancing, schedule modifications, and new cleaning procedures. After discussing their particular risk with a therapist based on the patient's personal risk factors, the patient has decided to proceed with in-person therapy.   Patient Details  Name: Colleen Lang MRN: [id number] Date of Birth: 10-Aug-1995 No data recorded  Encounter Date: 07/07/2019   PT End of Session - 07/07/19 1131    Visit Number 3    Number of Visits 10    Date for PT Re-Evaluation 09/01/19    Authorization Type MCAID    Authorization Time Period 06/23/19 through 07/21/19    Authorization - Visit Number 3    Authorization - Number of Visits 4    Progress Note Due on Visit 10    PT Start Time 1030    PT Stop Time 1130    PT Time Calculation (min) 60 min    Activity Tolerance Patient tolerated treatment well;No increased pain    Behavior During Therapy WFL for tasks assessed/performed           Past Medical History:  Diagnosis Date  . AR (allergic rhinitis)   . History of chlamydia infection 2014  . Ovarian cyst    seen on u/s 6 months ago- per pt they were small  . Pelvic pain in female     Past Surgical History:  Procedure Laterality Date  . KNEE SURGERY     x2  . TONSILLECTOMY    . WISDOM TOOTH EXTRACTION      There were no vitals filed for this visit.   Pelvic Floor Physical Therapy Treatment Note  SCREENING  Changes in medications, allergies, or medical history?: none    SUBJECTIVE  Patient reports: "It definitely helped for several days, didn't notice it kicking back in until Thursday or Friday. Just at home with the kids. Has a spot in her L shoulder that hurts  really bad that also seemed to be better until Friday night. Able to go 2-3 hours between urinating when pain was lower.  Precautions:  ~ 11 months PP  Sexual activity/pain: Has had pain before when she had a cyst, does not think that it would be painful now but has not tried recently.  Location of pain: B hips and lower abdomen (low back) Current pain: 3/10 (3/10) Max pain: 8/10 (8/10) Least pain: 0/10 (0/10) Nature of pain:stabbing/aching  **no pain following session  Patient Goals: To know what is going on and be able to pee less often, not have pain in the LB or pelvis with standing/walking for ~ 1 hr.  OBJECTIVE  Changes in: Posture/Observations:  L up-slip, hyperlordosis/kyphosis  Range of Motion/Flexibilty:  Decreased PIVM at T12-L1 with difficulty taking a deep breath during palpation.  Strength/MMT:  LE MMT:  Pelvic floor:  Abdominal:  Pt. Has difficulty recruiting deep-core in sitting>supine. Able to get weak TA recruitment following release at T/L junction.  Palpation: TTP to L QL, R Iliacus, B multifidus and erector spinae near T/L junction   Gait Analysis:  INTERVENTIONS THIS SESSION: Manual: Performed TP release to L QL, and R Iliacus followed by L up-slip correction to decrease spasm and pain and allow for improved balance of musculature and pelvic alignment for improved function  and decreased symptoms. Performed TP release to B multifidus near T-L junction and grade 3-5 mobs/ manip at T/L junction to to improve mobility of joint and surrounding connective tissue and decrease pressure on nerve roots for improved conductivity and function of down-stream tissues.   Therex: Educated on and practiced seated and hook-lying posterior pelvic tilts to improve strength of muscles opposing tight musculature to allow reciprocal inhibition to improve balance of musculature surrounding the pelvis and improve overall posture for optimal musculature length-tension  relationship and function and self TP release with a tennis ball to decrease spasm and pain and allow for improved balance of musculature for improved function and decreased symptoms.  Total time: 60 min.                       Objective measurements completed on examination: See above findings.                 PT Short Term Goals - 06/23/19 1254      PT SHORT TERM GOAL #1   Title Patient will demonstrate improved pelvic alignment and balance of musculature surrounding the pelvis to facilitate decreased PFM spasms and decrease pelvic pain.    Baseline R anterior innominate rotation, L up-slip    Time 5    Period Weeks    Status New    Target Date 07/28/19      PT SHORT TERM GOAL #2   Title Patient will report consistent use of foot-stool (squatty-potty) for positioning with BM to decrease pain with BM and intra-abdominal pressure.    Baseline Pt. having to wait a long times occasionally and has BM ~ every-other day with variable consisteency. Pain during BM    Time 5    Period Weeks    Status New    Target Date 07/28/19      PT SHORT TERM GOAL #3   Title Pt. will demonstrate implementation of behavioral modifications such as fluid intake and use of Urge suppression technique to allow for decreased UUI/frequency.    Baseline Pt. urinating ~ every 30 min, sometimes only 5-10 min. later.    Time 4    Period Weeks    Status New    Target Date 07/28/19             PT Long Term Goals - 06/23/19 1258      PT LONG TERM GOAL #1   Title Patient will report no episodes of SUI over the course of the prior two weeks to demonstrate improved functional ability.    Baseline SUI with cough/sneeze    Time 10    Period Weeks    Status New    Target Date 09/01/19      PT LONG TERM GOAL #2   Title Patient will describe pain no greater than 2/0 after doing ~ 1 hr of physical activity such as cleaning, walking, playing with kids, etc. demonstrate improved  functional ability.    Baseline Pt. has increased LBP and pelvic pain with walking and standing with a high of 8/10    Time 10    Period Weeks    Status New    Target Date 09/01/19      PT LONG TERM GOAL #3   Title Patient will report having BM's at least every-other day with consistency between [facility] stool scale 3-5 and no pain over the prior week to demonstrate decreased constipation.    Baseline Pt having BM's every-other day  but consistency varies and it can be painful.    Time 10    Period Weeks    Status New    Target Date 09/01/19      PT LONG TERM GOAL #4   Title Pt. will improve in FOTO score by 10 points (or maximal score) in each category to demonstrate improved function.    Baseline FOTO PFDIPain: 13, Urinary Problem: 60, PFDI Urinary:13    Time 10    Period Weeks    Status New    Target Date 09/01/19                  Plan - 07/07/19 1132    Clinical Impression Statement Pt. Responded well to all interventions today, demonstrating improved pelvic alignment, equal leg length, decreased spasm and resolution of pain, as well as understanding and correct performance of all education and exercises provided today. They will continue to benefit from skilled physical therapy to work toward remaining goals and maximize function as well as decrease likelihood of symptom increase or recurrence.    PT Next Visit Plan Perform TP release vs. TPDN to L>R diaphragm PRN, re-assess T-L junction and thoracic/sacral mobility. Give prone scapular rettraction/chin tuck and LB stretches.    PT Home Exercise Plan diaphragmatic breathing, side-stretch, proposal pose stretch, posterior pelvic tilts in hook-lying    Consulted and Agree with Plan of Care Patient           Patient will benefit from skilled therapeutic intervention in order to improve the following deficits and impairments:     Visit Diagnosis: Sacrococcygeal disorders, not elsewhere classified  Other muscle  spasm  Abnormal posture     Problem List Patient Active Problem List   Diagnosis Date Noted  . AFI (amniotic fluid index) increased 08/03/2018  . Non-reassuring electronic fetal monitoring tracing 08/02/2018  . Anemia of pregnancy in third trimester 08/02/2018  . Prior fetal macrosomia, antepartum 07/16/2018  . History of twin pregnancy in prior pregnancy 12/21/2017  . Sinus tachycardia 01/12/2015  . Menometrorrhagia 08/19/2014  . History of ovarian cyst 08/19/2014   Cleophus Molt DPT, ATC Cleophus Molt 07/07/2019, 11:34 AM   [facility] MAIN [facility] SERVICES [address] [place], [name], [address] Phone: [phone]   Fax:  [phone]  Name: Colleen Lang MRN: [id number] Date of Birth: November 11, 1995

## 2019-07-07 NOTE — Patient Instructions (Signed)
    This is The QL muscle  To perform release on this muscle, start by getting into this position by bridging the hips up and then slowly lowering your back, then your butt down to lengthen the low back then put the ball under you where you feel the tender spot and roll to the same side slightly to add pressure as needed. Hold still and take deep breaths until the pain is at least 50% less or, ideally, just pressure.   Pelvic Tilt With Pelvic Floor (Hook-Lying)        Lie with hips and knees bent. Draw in the lower tummy and flatten low back while breathing out so that pelvis tilts. Repeat _2x15__ times. Do _1-2__ times a day.  *Use hands just inside the hip bone to help you feel for the low tummy muscle drawing in.

## 2019-07-21 ENCOUNTER — Ambulatory Visit: Payer: Medicaid Other | Attending: Obstetrics and Gynecology

## 2019-07-21 ENCOUNTER — Other Ambulatory Visit: Payer: Self-pay

## 2019-07-21 DIAGNOSIS — M62838 Other muscle spasm: Secondary | ICD-10-CM

## 2019-07-21 DIAGNOSIS — M533 Sacrococcygeal disorders, not elsewhere classified: Secondary | ICD-10-CM

## 2019-07-21 DIAGNOSIS — R293 Abnormal posture: Secondary | ICD-10-CM | POA: Diagnosis not present

## 2019-07-21 NOTE — Patient Instructions (Signed)

## 2019-07-21 NOTE — Therapy (Signed)
[place] MAIN [facility] SERVICES [address] [place], [place], [address] Phone: [phone]   Fax:  [phone]  Physical Therapy Treatment  The patient has been informed of current processes in place at Outpatient Rehab to protect patients from Covid-19 exposure including social distancing, schedule modifications, and new cleaning procedures. After discussing their particular risk with a therapist based on the patient's personal risk factors, the patient has decided to proceed with in-person therapy.  Patient Details  Name: Colleen Lang MRN: [id number] Date of Birth: Mar 20, 1995 No data recorded  Encounter Date: 07/21/2019   PT End of Session - 07/21/19 1146    Visit Number 4    Number of Visits 10    Date for PT Re-Evaluation 09/01/19    Authorization Type MCAID    Authorization Time Period 06/23/19 through 07/21/19    Authorization - Visit Number 4    Authorization - Number of Visits 4    Progress Note Due on Visit 10    PT Start Time 4492    PT Stop Time 1133    PT Time Calculation (min) 54 min    Activity Tolerance Patient tolerated treatment well;No increased pain    Behavior During Therapy WFL for tasks assessed/performed           Past Medical History:  Diagnosis Date  . AR (allergic rhinitis)   . History of chlamydia infection 2014  . Ovarian cyst    seen on u/s 6 months ago- per pt they were small  . Pelvic pain in female     Past Surgical History:  Procedure Laterality Date  . KNEE SURGERY     x2  . TONSILLECTOMY    . WISDOM TOOTH EXTRACTION      There were no vitals filed for this visit.    Pelvic Floor Physical Therapy Treatment Note  SCREENING  Changes in medications, allergies, or medical history?: none    SUBJECTIVE  Patient reports: Feeling fine today, had really bad sharp pains the other day in the front and the back. That was Thursday and Friday. She had been doing fine up until then. Her Uncle  died 2022-03-18, another family member had a stroke. It felt like cramping and thought it may have been because she was going to start her period but she doesn't normally cramp that bad.   Precautions:  ~ 11 months PP  Sexual activity/pain: Has had pain before when she had a cyst, does not think that it would be painful now but has not tried recently.  Location of pain: L hip and lower abdomen (low back) Current pain: 2/10 (2/10) Max pain: 9/10 (9/10) Least pain: 0/10 (0/10) Nature of pain:stabbing/aching  **no pain following session  Patient Goals: To know what is going on and be able to pee less often, not have pain in the LB or pelvis with standing/walking for ~ 1 hr.  OBJECTIVE  Changes in: Posture/Observations:  R anterior rotation, L PSIS, ASIS and iliac crest high in standing.  **pelvis appears level following treatment.  Range of Motion/Flexibilty:   Strength/MMT:  LE MMT:  Pelvic floor:  Abdominal:  Pt. Has difficulty recruiting deep-core in sitting>supine. Able to get weak TA recruitment following release at T/L junction. (from previous session)  Palpation: TTP to L lumbar erector spinae, R Iliacus and Psoas.  Gait Analysis:  INTERVENTIONS THIS SESSION: Manual: Performed TP release to L lumbar erector spinae, R Iliacus and Psoas followed by MET correction x2 for  R anterior innominate rotation to decrease spasm and pain and allow for improved balance of musculature and pelvic alignment for improved function and decreased symptoms. Performed TP release to B multifidus near T-L junction and grade 3-5 mobs/ manip at T/L junction to to improve mobility of joint and surrounding connective tissue and decrease pressure on nerve roots for improved conductivity and function of down-stream tissues.   Self-care: Educated on and given a heel-lift for the RLE. Instructed on how to gradually increase wear to allow for minimal discomfort with adjustment period.   Total  time: 54 min.                                   PT Short Term Goals - 07/21/19 1118      PT SHORT TERM GOAL #1   Title Patient will demonstrate improved pelvic alignment and balance of musculature surrounding the pelvis to facilitate decreased PFM spasms and decrease pelvic pain.    Baseline R anterior innominate rotation, L up-slip    Time 5    Period Weeks    Status Achieved    Target Date 07/28/19      PT SHORT TERM GOAL #2   Title Patient will report consistent use of foot-stool (squatty-potty) for positioning with BM to decrease pain with BM and intra-abdominal pressure.    Baseline Pt. having to wait a long times occasionally and has BM ~ every-other day with variable consisteency. Pain during BM    Time 5    Period Weeks    Status Achieved    Target Date 07/28/19      PT SHORT TERM GOAL #3   Title Pt. will demonstrate implementation of behavioral modifications such as fluid intake and use of Urge suppression technique to allow for decreased UUI/frequency.    Baseline Pt. urinating ~ every 30 min, sometimes only 5-10 min. later.    Time 4    Period Weeks    Status New    Target Date 07/28/19             PT Long Term Goals - 06/23/19 1258      PT LONG TERM GOAL #1   Title Patient will report no episodes of SUI over the course of the prior two weeks to demonstrate improved functional ability.    Baseline SUI with cough/sneeze    Time 10    Period Weeks    Status New    Target Date 09/01/19      PT LONG TERM GOAL #2   Title Patient will describe pain no greater than 2/0 after doing ~ 1 hr of physical activity such as cleaning, walking, playing with kids, etc. demonstrate improved functional ability.    Baseline Pt. has increased LBP and pelvic pain with walking and standing with a high of 8/10    Time 10    Period Weeks    Status New    Target Date 09/01/19      PT LONG TERM GOAL #3   Title Patient will report having BM's at least  every-other day with consistency between [facility] stool scale 3-5 and no pain over the prior week to demonstrate decreased constipation.    Baseline Pt having BM's every-other day but consistency varies and it can be painful.    Time 10    Period Weeks    Status New    Target Date 09/01/19  PT LONG TERM GOAL #4   Title Pt. will improve in FOTO score by 10 points (or maximal score) in each category to demonstrate improved function.    Baseline FOTO PFDIPain: 13, Urinary Problem: 60, PFDI Urinary:13    Time 10    Period Weeks    Status New    Target Date 09/01/19                 Plan - 07/21/19 1154    Clinical Impression Statement Pt. has met her short term gols and some of her long-term goals and is making progress in all areas but has not been able to maintain Sx. improvement over 2 weeks yet. This is believed  to be a result of an underlying LLD which was addressed today as well as core weakness which will be addressed more over the coming visits as her pain and dysfunction is more stable. We will continue for 10 more weeks at an every-other week basis to allow for time for Pt. to demonstrate increased strength and pelvic stability to ensure she can maintain Sx. reduction long-term and make sure she adjusts well to the newly implemented heel-lift. She Responded well to all interventions today, demonstrating decreased pain and spasm as well as a level pelvis in standing with addition of a heel-lift for the RLE.    Personal Factors and Comorbidities Time since onset of injury/illness/exacerbation    Examination-Activity Limitations Caring for Others;Continence;Locomotion Level    Examination-Participation Restrictions Yard Work;Cleaning;Community Activity    Stability/Clinical Decision Making Evolving/Moderate complexity    Clinical Decision Making Moderate    Rehab Potential Good    PT Frequency Biweekly    PT Duration Other (comment)   10 weeks   PT Treatment/Interventions  ADLs/Self Care Home Management;Biofeedback;Moist Heat;Electrical Stimulation;Traction;Ultrasound;Therapeutic activities;Functional mobility training;Stair training;Gait training;Therapeutic exercise;Neuromuscular re-education;Patient/family education;Manual techniques;Scar mobilization;Taping;Dry needling;Passive range of motion;Spinal Manipulations;Joint Manipulations;Visual/perceptual remediation/compensation    PT Next Visit Plan Perform TP release vs. TPDN to L>R diaphragm PRN, review and progress deep-core and add glute strengthening. re-assess T-L junction and thoracic/sacral mobility. Give prone scapular rettraction/chin tuck and LB stretches.    PT Home Exercise Plan diaphragmatic breathing, side-stretch, proposal pose stretch, posterior pelvic tilts in hook-lying, heel-lift for RLE    Consulted and Agree with Plan of Care Patient           Patient will benefit from skilled therapeutic intervention in order to improve the following deficits and impairments:  Increased muscle spasms, Decreased range of motion, Decreased scar mobility, Improper body mechanics, Pain, Postural dysfunction, Impaired flexibility, Increased fascial restricitons, Decreased strength, Decreased coordination  Visit Diagnosis: Sacrococcygeal disorders, not elsewhere classified  Other muscle spasm  Abnormal posture     Problem List Patient Active Problem List   Diagnosis Date Noted  . AFI (amniotic fluid index) increased 08/03/2018  . Non-reassuring electronic fetal monitoring tracing 08/02/2018  . Anemia of pregnancy in third trimester 08/02/2018  . Prior fetal macrosomia, antepartum 07/16/2018  . History of twin pregnancy in prior pregnancy 12/21/2017  . Sinus tachycardia 01/12/2015  . Menometrorrhagia 08/19/2014  . History of ovarian cyst 08/19/2014   Willa Rough DPT, ATC Willa Rough 07/21/2019, 12:00 PM  [place] MAIN [facility] SERVICES [address]  [place], [place], [address] Phone: [phone]   Fax:  [phone]  Name: SEANNA SISLER MRN: [id number] Date of Birth: 01/13/1995

## 2019-07-28 ENCOUNTER — Ambulatory Visit: Payer: Medicaid Other

## 2019-08-04 ENCOUNTER — Other Ambulatory Visit: Payer: Self-pay

## 2019-08-04 ENCOUNTER — Ambulatory Visit: Payer: Medicaid Other

## 2019-08-04 DIAGNOSIS — M62838 Other muscle spasm: Secondary | ICD-10-CM

## 2019-08-04 DIAGNOSIS — M533 Sacrococcygeal disorders, not elsewhere classified: Secondary | ICD-10-CM

## 2019-08-04 DIAGNOSIS — R293 Abnormal posture: Secondary | ICD-10-CM

## 2019-08-04 NOTE — Patient Instructions (Signed)
Scapular Retraction: Rowing (Eccentric) - Arms - 45 Degrees (Resistance Band)    Hold end of band in each hand. Pull back until elbows are even with trunk. Keep elbows out from sides at 45, thumbs up. Slowly release for 3-5 seconds. Use ___Blue_____ resistance band. __~10_ reps per set, _2-3__ sets per day, _3-5__ days per week.      Start with Shoulder Retraction and Downward Rotation pictures above, holding the position while pulling the chin straight back as if trying to make a "double chin".  Breathe in forward and breathe out as you pull back, repeating this _2x15__ times __1-2__ times per day.  Exhale and make a "double chin". Hold for 5 sec, repeat 5 times, 2 times per day.

## 2019-08-04 NOTE — Therapy (Signed)
[name], [name], [address] Phone: [phone]   Fax:  [phone]  Physical Therapy Treatment  The patient has been informed of current processes in place at [facility]patient [facility] to protect patients from Covid-19 exposure including social distancing, schedule modifications, and new cleaning procedures. After discussing their particular risk with a therapist based on the patient's personal risk factors, the patient has decided to proceed with in-person therapy.   Patient Details  Name: Colleen Lang MRN: [id number] Date of Birth: [birth year] No data recorded  Encounter Date: 08/04/2019   PT End of Session - 08/04/19 1134    Visit Number 5    Number of Visits 9    Date for PT Re-Evaluation 09/01/19    Authorization Type MCAID    Authorization Time Period 07/21/19 through 09/29/19    Authorization - Visit Number 1    Authorization - Number of Visits 5    Progress Note Due on Visit 9    Activity Tolerance Patient tolerated treatment well;No increased pain    Behavior During Therapy WFL for tasks assessed/performed           Past Medical History:  Diagnosis Date  . AR (allergic rhinitis)   . History of chlamydia infection 2014  . Ovarian cyst    seen on u/s 6 months ago- per pt they were small  . Pelvic pain in female     Past Surgical History:  Procedure Laterality Date  . KNEE SURGERY     x2  . TONSILLECTOMY    . WISDOM TOOTH EXTRACTION      There were no vitals filed for this visit.    Pelvic Floor Physical Therapy Treatment Note  SCREENING  Changes in medications, allergies, or medical history?: none    SUBJECTIVE  Patient reports: Has not had much pain other than the one spot in the back. " I cannot seem to kick that". Not having hip pain and is doing fine using the bathroom. Has only worn the heel-lift for ~ 3 hours at most yesterday. Still feels like she is "walking funny" but  does not notice more pain when she is wearing it. Has a HA today and highest pain is in her L shoulderl  Precautions:  ~ 11 months PP  Sexual activity/pain: Has had pain before when she had a cyst, does not think that it would be painful now but has not tried recently.  Location of pain: L upper back and shoulder (low back) Current pain: 7/10 (5/10) Max pain: 10/10 (5/10) Least pain: 0/10 (0/10) Nature of pain:stabbing/aching  **no pain in upper back/neck or HA following session  Patient Goals: To know what is going on and be able to pee less often, not have pain in the LB or pelvis with standing/walking for ~ 1 hr.  OBJECTIVE  Changes in: Posture/Observations:  PSIS level with heel-lift in place.  Range of Motion/Flexibilty:  Pt. Demonstrates lumbar tightness but is not able to engage the deep core to help her isolate the stretch to her low back in sitting or standing.  Decreased PIVM at C2-T8  Strength/MMT:  LE MMT:  Pelvic floor:  Abdominal:  Pt. Has difficulty recruiting deep-core in sitting>supine. Able to get weak TA recruitment following release at T/L junction. (from previous session)   Palpation: TTP to B upper trap, levator scap, deep and superficial cervical extensors, STP, and Pec major and minor.   Gait Analysis:  INTERVENTIONS THIS  SESSION: Manual: Performed TP release toB upper trap, levator scap, deep and superficial cervical extensors, STP, and Pec major and minor followed grade 3-4 PA mobs to Decreased PIVM at C2-T8 to decrease spasm and pain and allow for improved balance of musculature and pelvic alignment for improved function and decreased symptoms and to improve mobility of joint and surrounding connective tissue and decrease pressure on nerve roots for improved conductivity and function of down-stream tissues.   Therex: educated on and practiced seated rows and prone and seated chin-tucks to improve mobility of joint and surrounding  connective tissue and decrease pressure on nerve roots for improved conductivity and function of down-stream tissues and to improve strength of muscles opposing tight musculature to allow reciprocal inhibition to improve balance of musculature surrounding the pelvis and improve overall posture for optimal musculature length-tension relationship and function.  Total time: 60 min.                             PT Short Term Goals - 07/21/19 1118      PT SHORT TERM GOAL #1   Title Patient will demonstrate improved pelvic alignment and balance of musculature surrounding the pelvis to facilitate decreased PFM spasms and decrease pelvic pain.    Baseline R anterior innominate rotation, L up-slip    Time 5    Period Weeks    Status Achieved    Target Date 07/28/19      PT SHORT TERM GOAL #2   Title Patient will report consistent use of foot-stool (squatty-potty) for positioning with BM to decrease pain with BM and intra-abdominal pressure.    Baseline Pt. having to wait a long times occasionally and has BM ~ every-other day with variable consisteency. Pain during BM    Time 5    Period Weeks    Status Achieved    Target Date 07/28/19      PT SHORT TERM GOAL #3   Title Pt. will demonstrate implementation of behavioral modifications such as fluid intake and use of Urge suppression technique to allow for decreased UUI/frequency.    Baseline Pt. urinating ~ every 30 min, sometimes only 5-10 min. later. As of 7/12: able to go 2-3 hours between urinating and no nocturia.    Time 4    Period Weeks    Status Achieved    Target Date 07/28/19             PT Long Term Goals - 07/21/19 1207      PT LONG TERM GOAL #1   Title Patient will report no episodes of SUI over the course of the prior two weeks to demonstrate improved functional ability.    Baseline SUI with cough/sneeze As of 7/12: Pt. only having SUI when she has delayed emptying and sneezes, not much fluid loss.     Time 10    Period Weeks    Status On-going    Target Date 09/29/19      PT LONG TERM GOAL #2   Title Patient will describe pain no greater than 2/0 after doing ~ 1 hr of physical activity such as cleaning, walking, playing with kids, etc. demonstrate improved functional ability.    Baseline Pt. has increased LBP and pelvic pain with walking and standing with a high of 8/10. As of 7/12: Pt. able to go 1 week without increase pain over 2/10 but not 2 weeks due to LLD.    Time 10  Period Weeks    Status On-going    Target Date 09/29/19      PT LONG TERM GOAL #3   Title Patient will report having BM's at least every-other day with consistency between [facility] stool scale 3-5 and no pain over the prior week to demonstrate decreased constipation.    Baseline Pt having BM's every-other day but consistency varies and it can be painful.    Time 10    Period Weeks    Status Achieved    Target Date 09/01/19      PT LONG TERM GOAL #4   Title Pt. will improve in FOTO score by 10 points (or maximal score) in each category to demonstrate improved function.    Baseline FOTO PFDIPain: 13, Urinary Problem: 60, PFDI Urinary:13    Time 10    Period Weeks    Status On-going    Target Date 09/29/19                 Plan - 08/04/19 1136    Clinical Impression Statement Pt. Responded well to all interventions today, demonstrating improved postural awareness and ability to achieve appropriate standing posture, decreased spasm and pain, as well as understanding and correct performance of all education and exercises provided today. They will continue to benefit from skilled physical therapy to work toward remaining goals and maximize function as well as decrease likelihood of symptom increase or recurrence.    PT Next Visit Plan DN to L rectus? Perform TP release to lumbar extensors/T/L junction multifidus and L>R diaphragm and hip-flexors PRN, review and progress deep-core and add glute  strengthening. re-assess sacral mobility. Give LB stretches if able to feel.    PT Home Exercise Plan diaphragmatic breathing, side-stretch, proposal pose stretch, posterior pelvic tilts in hook-lying, heel-lift for RLE, seated rows, prone and seated chin-tucks    Consulted and Agree with Plan of Care Patient           Patient will benefit from skilled therapeutic intervention in order to improve the following deficits and impairments:     Visit Diagnosis: Sacrococcygeal disorders, not elsewhere classified  Other muscle spasm  Abnormal posture     Problem List Patient Active Problem List   Diagnosis Date Noted  . AFI (amniotic fluid index) increased 08/03/2018  . Non-reassuring electronic fetal monitoring tracing 08/02/2018  . Anemia of pregnancy in third trimester 08/02/2018  . Prior fetal macrosomia, antepartum 07/16/2018  . History of twin pregnancy in prior pregnancy 12/21/2017  . Sinus tachycardia 01/12/2015  . Menometrorrhagia 08/19/2014  . History of ovarian cyst 08/19/2014   Cleophus Molt DPT, ATC Cleophus Molt 08/04/2019, 11:39 AM   [facility] MAIN [facility] SERVICES [address] [place], [name], [address] Phone: [phone]   Fax:  [phone]  Name: Colleen Lang MRN: [id number] Date of Birth: Oct 01, 1995

## 2019-08-11 ENCOUNTER — Other Ambulatory Visit: Payer: Self-pay

## 2019-08-11 ENCOUNTER — Ambulatory Visit: Payer: Medicaid Other | Attending: Obstetrics and Gynecology

## 2019-08-11 DIAGNOSIS — M533 Sacrococcygeal disorders, not elsewhere classified: Secondary | ICD-10-CM | POA: Insufficient documentation

## 2019-08-11 DIAGNOSIS — M62838 Other muscle spasm: Secondary | ICD-10-CM | POA: Insufficient documentation

## 2019-08-11 DIAGNOSIS — R293 Abnormal posture: Secondary | ICD-10-CM

## 2019-08-11 NOTE — Therapy (Signed)
[facility] MAIN [facility] SERVICES [address] [place], [name], [address] Phone: [phone]   Fax:  [phone]  Physical Therapy Treatment  The patient has been informed of current processes in place at Outpatient Rehab to protect patients from Covid-19 exposure including social distancing, schedule modifications, and new cleaning procedures. After discussing their particular risk with a therapist based on the patient's personal risk factors, the patient has decided to proceed with in-person therapy.  Patient Details  Name: Colleen Lang MRN: [id number] Date of Birth: 1995/09/27 No data recorded  Encounter Date: 08/11/2019   PT End of Session - 08/11/19 1151    Visit Number 6    Number of Visits 9    Date for PT Re-Evaluation 09/01/19    Authorization Type MCAID    Authorization Time Period 07/21/19 through 09/29/19    Authorization - Visit Number 2    Authorization - Number of Visits 5    Progress Note Due on Visit 9    PT Start Time 1030    PT Stop Time 1130    PT Time Calculation (min) 60 min    Activity Tolerance Patient tolerated treatment well;No increased pain    Behavior During Therapy WFL for tasks assessed/performed           Past Medical History:  Diagnosis Date  . AR (allergic rhinitis)   . History of chlamydia infection 2014  . Ovarian cyst    seen on u/s 6 months ago- per pt they were small  . Pelvic pain in female     Past Surgical History:  Procedure Laterality Date  . KNEE SURGERY     x2  . TONSILLECTOMY    . WISDOM TOOTH EXTRACTION      There were no vitals filed for this visit.  Pelvic Floor Physical Therapy Treatment Note  SCREENING  Changes in medications, allergies, or medical history?: none    SUBJECTIVE  Patient reports: Wearing the heel lift most of the day now with no issues. Back felt good until Thursday, has not had any pelvic pain. Headache came back Wednesday morning.  Precautions:  ~  11 months PP  Sexual activity/pain: Has had pain before when she had a cyst, does not think that it would be painful now but has not tried recently.  Location of pain: L upper back and shoulder (low back) Current pain: 4/10 (0/10) Max pain: 8/10 (0/10) Least pain: 0/10 (0/10) Nature of pain:stabbing/aching  **no HA following session, 3/10 "soreness" following manual treatment.  Patient Goals: To know what is going on and be able to pee less often, not have pain in the LB or pelvis with standing/walking for ~ 1 hr.  OBJECTIVE  Changes in: Posture/Observations:  PSIS level with heel-lift in place. (from previous session)  L 10th rib anteriorly/laterally dispaced relative to surrounding ribs.  Range of Motion/Flexibilty:  Decreased PIVM at T11-T2 and along ~ 10th rib.  Strength/MMT:  LE MMT:  Pelvic floor:  Abdominal:  Pt. Has difficulty recruiting deep-core in sitting>supine. Able to get weak TA recruitment following release at T/L junction. (from previous session)  Today: Pt. Required Maximal TC, VC, but able to demonstrate 3 appropriately coordinated TA contractions with posterior pelvic tilt at EOS.   Palpation: TTP to R sub-occipitals and cervical multifidus and superficial extensors at ~ C3 and C6, L intercostals surrounding 10th rib, diaphragm and rectus abdominus, B T10-L2 multifidus and erector spinae. Decreased fascial mobility through L lateral abdomen in  caudal direction.  Gait Analysis:  INTERVENTIONS THIS SESSION: Manual: Performed TP release to R sub-occipitals and cervical multifidus and superficial extensors at ~ C3 and C6, L intercostals surrounding 10th rib, diaphragm and rectus abdominus, B T10-L2 multifidus and erector spinae followed grade 3-4 PA mobs to T10-L2 and MFR along L lateral abdomen in a caudal direction to decrease spasm and pain and allow for improved balance of musculature and pelvic alignment for improved function and decreased  symptoms and to improve mobility of joint and surrounding connective tissue and decrease pressure on nerve roots for improved conductivity and function of down-stream tissues.   Performed TPDN with a .30x15mm needle and standard approach as described below to decrease spasm and pain and allow for improved balance of musculature for improved function and decreased symptoms.  Therex: Reviewed posterior pelvic tilts in hook-lying to improve deep-core recruitment and coordination to improve stability and decrease pressure on the T/L junction for decreased spasm and pain.  Total time: 60 min.                       Trigger Point Dry Needling - 08/11/19 0001    Consent Given? Yes    Muscles Treated Back/Hip Erector spinae;Lumbar multifidi;Thoracic multifidi    Erector spinae Response Twitch response elicited;Palpable increased muscle length    Lumbar multifidi Response Twitch response elicited;Palpable increased muscle length    Thoracic multifidi response Twitch response elicited;Palpable increased muscle length                  PT Short Term Goals - 07/21/19 1118      PT SHORT TERM GOAL #1   Title Patient will demonstrate improved pelvic alignment and balance of musculature surrounding the pelvis to facilitate decreased PFM spasms and decrease pelvic pain.    Baseline R anterior innominate rotation, L up-slip    Time 5    Period Weeks    Status Achieved    Target Date 07/28/19      PT SHORT TERM GOAL #2   Title Patient will report consistent use of foot-stool (squatty-potty) for positioning with BM to decrease pain with BM and intra-abdominal pressure.    Baseline Pt. having to wait a long times occasionally and has BM ~ every-other day with variable consisteency. Pain during BM    Time 5    Period Weeks    Status Achieved    Target Date 07/28/19      PT SHORT TERM GOAL #3   Title Pt. will demonstrate implementation of behavioral modifications such as fluid  intake and use of Urge suppression technique to allow for decreased UUI/frequency.    Baseline Pt. urinating ~ every 30 min, sometimes only 5-10 min. later. As of 7/12: able to go 2-3 hours between urinating and no nocturia.    Time 4    Period Weeks    Status Achieved    Target Date 07/28/19             PT Long Term Goals - 07/21/19 1207      PT LONG TERM GOAL #1   Title Patient will report no episodes of SUI over the course of the prior two weeks to demonstrate improved functional ability.    Baseline SUI with cough/sneeze As of 7/12: Pt. only having SUI when she has delayed emptying and sneezes, not much fluid loss.    Time 10    Period Weeks    Status On-going  Target Date 09/29/19      PT LONG TERM GOAL #2   Title Patient will describe pain no greater than 2/0 after doing ~ 1 hr of physical activity such as cleaning, walking, playing with kids, etc. demonstrate improved functional ability.    Baseline Pt. has increased LBP and pelvic pain with walking and standing with a high of 8/10. As of 7/12: Pt. able to go 1 week without increase pain over 2/10 but not 2 weeks due to LLD.    Time 10    Period Weeks    Status On-going    Target Date 09/29/19      PT LONG TERM GOAL #3   Title Patient will report having BM's at least every-other day with consistency between [facility] stool scale 3-5 and no pain over the prior week to demonstrate decreased constipation.    Baseline Pt having BM's every-other day but consistency varies and it can be painful.    Time 10    Period Weeks    Status Achieved    Target Date 09/01/19      PT LONG TERM GOAL #4   Title Pt. will improve in FOTO score by 10 points (or maximal score) in each category to demonstrate improved function.    Baseline FOTO PFDIPain: 13, Urinary Problem: 60, PFDI Urinary:13    Time 10    Period Weeks    Status On-going    Target Date 09/29/19                 Plan - 08/11/19 1152    Clinical Impression  Statement Pt. Responded well to all interventions today, demonstrating decreased HA and back pain, improved rib alignment and ease of breathing, as well as understanding and correct performance of all education and exercises provided today. They will continue to benefit from skilled physical therapy to work toward remaining goals and maximize function as well as decrease likelihood of symptom increase or recurrence.    PT Next Visit Plan Perform TP release to lumbar extensors/and hip-flexors PRN, review posterior pelvic tilts and progress deep-core and add glute strengthening. re-assess sacral mobility. Give LB stretches if able to feel.    PT Home Exercise Plan diaphragmatic breathing, side-stretch, proposal pose stretch, posterior pelvic tilts in hook-lying, heel-lift for RLE, seated rows, prone and seated chin-tucks    Consulted and Agree with Plan of Care Patient           Patient will benefit from skilled therapeutic intervention in order to improve the following deficits and impairments:     Visit Diagnosis: Sacrococcygeal disorders, not elsewhere classified  Other muscle spasm  Abnormal posture     Problem List Patient Active Problem List   Diagnosis Date Noted  . AFI (amniotic fluid index) increased 08/03/2018  . Non-reassuring electronic fetal monitoring tracing 08/02/2018  . Anemia of pregnancy in third trimester 08/02/2018  . Prior fetal macrosomia, antepartum 07/16/2018  . History of twin pregnancy in prior pregnancy 12/21/2017  . Sinus tachycardia 01/12/2015  . Menometrorrhagia 08/19/2014  . History of ovarian cyst 08/19/2014   Cleophus Molt DPT, ATC Cleophus Molt 08/11/2019, 11:54 AM  [facility] MAIN [facility] SERVICES [address] [place], [name], [address] Phone: [phone]   Fax:  [phone]  Name: ANNALIYAH WILLIG MRN: [id number] Date of Birth: June 17, 1995

## 2019-08-11 NOTE — Patient Instructions (Signed)
Pelvic Tilt With Pelvic Floor (Hook-Lying)        Lie with hips and knees bent. Squeeze pelvic floor and flatten low back while breathing out so that pelvis tilts. Repeat _10x3__ times. Do _1-2__ times a day.

## 2019-08-18 ENCOUNTER — Ambulatory Visit: Payer: Medicaid Other

## 2019-08-18 ENCOUNTER — Other Ambulatory Visit: Payer: Self-pay

## 2019-08-18 DIAGNOSIS — R293 Abnormal posture: Secondary | ICD-10-CM | POA: Diagnosis not present

## 2019-08-18 DIAGNOSIS — M533 Sacrococcygeal disorders, not elsewhere classified: Secondary | ICD-10-CM

## 2019-08-18 DIAGNOSIS — M62838 Other muscle spasm: Secondary | ICD-10-CM

## 2019-08-18 NOTE — Therapy (Signed)
[place] [facility] MAIN [facility] SERVICES [address] [place], [name], [address] Phone: [phone]   Fax:  [phone]  Physical Therapy Treatment  The patient has been informed of current processes in place at Outpatient Rehab to protect patients from Covid-19 exposure including social distancing, schedule modifications, and new cleaning procedures. After discussing their particular risk with a therapist based on the patient's personal risk factors, the patient has decided to proceed with in-person therapy.  Patient Details  Name: Colleen Lang MRN: [id number] Date of Birth: 1995-07-19 No data recorded  Encounter Date: 08/18/2019   PT End of Session - 08/18/19 1200    Visit Number 7    Number of Visits 9    Date for PT Re-Evaluation 09/01/19    Authorization Type MCAID    Authorization Time Period 07/21/19 through 09/29/19    Authorization - Visit Number 3    Authorization - Number of Visits 5    Progress Note Due on Visit 9    PT Start Time 1030    PT Stop Time 1130    PT Time Calculation (min) 60 min    Activity Tolerance Patient tolerated treatment well;No increased pain    Behavior During Therapy WFL for tasks assessed/performed           Past Medical History:  Diagnosis Date  . AR (allergic rhinitis)   . History of chlamydia infection 2014  . Ovarian cyst    seen on u/s 6 months ago- per pt they were small  . Pelvic pain in female     Past Surgical History:  Procedure Laterality Date  . KNEE SURGERY     x2  . TONSILLECTOMY    . WISDOM TOOTH EXTRACTION      There were no vitals filed for this visit.  Pelvic Floor Physical Therapy Treatment Note  SCREENING  Changes in medications, allergies, or medical history?: none    SUBJECTIVE  Patient reports: Neck, shoulder and middle back are in pain today. Has had a H/A requiring her to take medication for the last week.  Precautions:  ~ 11 months PP  Sexual  activity/pain: Has had pain before when she had a cyst, does not think that it would be painful now but has not tried recently.  Location of pain: neck, back and L shoulder (HA) Current pain: 4/10 (5/10) Max pain: 10/10 (10/10) Least pain: 0/10 (0/10) Nature of pain:stabbing/aching  **no back pain but still pain through L shoulder and neck following treatment.  Patient Goals: To know what is going on and be able to pee less often, not have pain in the LB or pelvis with standing/walking for ~ 1 hr.  OBJECTIVE  Changes in: Posture/Observations:  PSIS level with heel-lift in place.  L 10th rib anteriorly/laterally dispaced relative to surrounding ribs. (from previous session)  Range of Motion/Flexibilty:  Decreased fascial mobility through low back, B lateral abdomen, T-L junction and medial scapular borders.  Decreased quadriceps length which is impeding ability to feel deep hip-flexor stretch.  Strength/MMT:  LE MMT:  Pelvic floor:  Abdominal:  Pt. Has difficulty recruiting deep-core in sitting>supine. Able to get weak TA recruitment following release at T/L junction. (from previous session)  Today: Pt. Continues to have difficulty recruiting deep-core due to return of Psoas spasms B.   Palpation: TTP to B Psoas.  Gait Analysis:  INTERVENTIONS THIS SESSION: Manual: Performed MFR with cupping through the entire back with emphasis at areas of restriction through low  back, B lateral abdomen, T-L junction and medial scapular borders to allow for improved mobility with decreased pain and spasm through the back and abdomen.  Therex: Reviewed posterior pelvic tilts in hook-lying to improve deep-core recruitment with minimal success due to returned Psoas spasms.Reviewed proposal pose hip-flexor stretch to decrease tension pulling anteriorly into deeper lordosis and allow for decreased spasm and pain and improved deep-core recruitment and pain.  Total time: 60  min.                              PT Short Term Goals - 07/21/19 1118      PT SHORT TERM GOAL #1   Title Patient will demonstrate improved pelvic alignment and balance of musculature surrounding the pelvis to facilitate decreased PFM spasms and decrease pelvic pain.    Baseline R anterior innominate rotation, L up-slip    Time 5    Period Weeks    Status Achieved    Target Date 07/28/19      PT SHORT TERM GOAL #2   Title Patient will report consistent use of foot-stool (squatty-potty) for positioning with BM to decrease pain with BM and intra-abdominal pressure.    Baseline Pt. having to wait a long times occasionally and has BM ~ every-other day with variable consisteency. Pain during BM    Time 5    Period Weeks    Status Achieved    Target Date 07/28/19      PT SHORT TERM GOAL #3   Title Pt. will demonstrate implementation of behavioral modifications such as fluid intake and use of Urge suppression technique to allow for decreased UUI/frequency.    Baseline Pt. urinating ~ every 30 min, sometimes only 5-10 min. later. As of 7/12: able to go 2-3 hours between urinating and no nocturia.    Time 4    Period Weeks    Status Achieved    Target Date 07/28/19             PT Long Term Goals - 07/21/19 1207      PT LONG TERM GOAL #1   Title Patient will report no episodes of SUI over the course of the prior two weeks to demonstrate improved functional ability.    Baseline SUI with cough/sneeze As of 7/12: Pt. only having SUI when she has delayed emptying and sneezes, not much fluid loss.    Time 10    Period Weeks    Status On-going    Target Date 09/29/19      PT LONG TERM GOAL #2   Title Patient will describe pain no greater than 2/0 after doing ~ 1 hr of physical activity such as cleaning, walking, playing with kids, etc. demonstrate improved functional ability.    Baseline Pt. has increased LBP and pelvic pain with walking and standing with a high  of 8/10. As of 7/12: Pt. able to go 1 week without increase pain over 2/10 but not 2 weeks due to LLD.    Time 10    Period Weeks    Status On-going    Target Date 09/29/19      PT LONG TERM GOAL #3   Title Patient will report having BM's at least every-other day with consistency between [facility] stool scale 3-5 and no pain over the prior week to demonstrate decreased constipation.    Baseline Pt having BM's every-other day but consistency varies and it can be painful.  Time 10    Period Weeks    Status Achieved    Target Date 09/01/19      PT LONG TERM GOAL #4   Title Pt. will improve in FOTO score by 10 points (or maximal score) in each category to demonstrate improved function.    Baseline FOTO PFDIPain: 13, Urinary Problem: 60, PFDI Urinary:13    Time 10    Period Weeks    Status On-going    Target Date 09/29/19                 Plan - 08/18/19 1201    Clinical Impression Statement Pt. Responded well to all interventions today, demonstrating improved fascial mobility through the back and abdomen and decreased pain from 4/10 to 0/10, as well as understanding and correct performance of all education and exercises provided today. They will continue to benefit from skilled physical therapy to work toward remaining goals and maximize function as well as decrease likelihood of symptom increase or recurrence.    PT Next Visit Plan Perform MF release to hip-flexors and neck/shoulder PRN, review posterior pelvic tilts and progress deep-core and add glute strengthening. re-assess sacral mobility. Give LB stretches if able to feel.    PT Home Exercise Plan diaphragmatic breathing, side-stretch, proposal pose stretch, posterior pelvic tilts in hook-lying, heel-lift for RLE, seated rows, prone and seated chin-tucks    Consulted and Agree with Plan of Care Patient           Patient will benefit from skilled therapeutic intervention in order to improve the following deficits and  impairments:     Visit Diagnosis: Sacrococcygeal disorders, not elsewhere classified  Other muscle spasm  Abnormal posture     Problem List Patient Active Problem List   Diagnosis Date Noted  . AFI (amniotic fluid index) increased 08/03/2018  . Non-reassuring electronic fetal monitoring tracing 08/02/2018  . Anemia of pregnancy in third trimester 08/02/2018  . Prior fetal macrosomia, antepartum 07/16/2018  . History of twin pregnancy in prior pregnancy 12/21/2017  . Sinus tachycardia 01/12/2015  . Menometrorrhagia 08/19/2014  . History of ovarian cyst 08/19/2014   Cleophus Molt DPT, ATC Cleophus Molt 08/18/2019, 12:03 PM  [place] [facility] MAIN [facility] SERVICES [address] [place], [name], [address] Phone: [phone]   Fax:  [phone]  Name: TAMMY WICKLIFFE MRN: [id number] Date of Birth: 1995-10-20

## 2019-08-25 ENCOUNTER — Ambulatory Visit: Payer: Medicaid Other

## 2019-08-25 ENCOUNTER — Other Ambulatory Visit: Payer: Self-pay

## 2019-08-25 DIAGNOSIS — R293 Abnormal posture: Secondary | ICD-10-CM | POA: Diagnosis not present

## 2019-08-25 DIAGNOSIS — M62838 Other muscle spasm: Secondary | ICD-10-CM

## 2019-08-25 DIAGNOSIS — M533 Sacrococcygeal disorders, not elsewhere classified: Secondary | ICD-10-CM

## 2019-08-25 NOTE — Therapy (Signed)
[facility] MAIN [facility] SERVICES [address] [place], [name], [address] Phone: [phone]   Fax:  [phone]  Physical Therapy Treatment  The patient has been informed of current processes in place at Outpatient Rehab to protect patients from Covid-19 exposure including social distancing, schedule modifications, and new cleaning procedures. After discussing their particular risk with a therapist based on the patient's personal risk factors, the patient has decided to proceed with in-person therapy.  Patient Details  Name: Colleen Lang MRN: [id number] Date of Birth: [birth year] No data recorded  Encounter Date: 08/25/2019   PT End of Session - 08/25/19 1155    Visit Number 8    Number of Visits 9    Date for PT Re-Evaluation 09/01/19    Authorization Type MCAID    Authorization Time Period 07/21/19 through 09/29/19    Authorization - Visit Number 4    Authorization - Number of Visits 5    Progress Note Due on Visit 9    PT Start Time 1035    PT Stop Time 1135    PT Time Calculation (min) 60 min    Activity Tolerance Patient tolerated treatment well;No increased pain    Behavior During Therapy WFL for tasks assessed/performed           Past Medical History:  Diagnosis Date  . AR (allergic rhinitis)   . History of chlamydia infection 2014  . Ovarian cyst    seen on u/s 6 months ago- per pt they were small  . Pelvic pain in female     Past Surgical History:  Procedure Laterality Date  . KNEE SURGERY     x2  . TONSILLECTOMY    . WISDOM TOOTH EXTRACTION      There were no vitals filed for this visit.  Pelvic Floor Physical Therapy Treatment Note  SCREENING  Changes in medications, allergies, or medical history?: none    SUBJECTIVE  Patient reports: L neck/shoulder are still hurting and has had a H/A. Her R R hip was sore at the end of the day after a lot of walking but not bad today.   Precautions:  ~ 11 months  PP  Sexual activity/pain: Has had pain before when she had a cyst, does not think that it would be painful now but has not tried recently.  Location of pain: neck and L shoulder (HA) Current pain: 4/10 (3/10) Max pain: 10/10 (6/10) Least pain: 0/10 (0/10) Nature of pain:stabbing/aching  **HA is "better" and less neck pain, some still near base of L ear.  Patient Goals: To know what is going on and be able to pee less often, not have pain in the LB or pelvis with standing/walking for ~ 1 hr.  OBJECTIVE  Changes in: Posture/Observations:  PSIS level with heel-lift in place.  L 10th rib anteriorly/laterally dispaced relative to surrounding ribs. (from previous session)  Range of Motion/Flexibilty:  Decreased fascial mobility through L posterior and superior shoulder.   Strength/MMT:  LE MMT:  Pelvic floor:  Abdominal:  Pt. Has difficulty recruiting deep-core in sitting>supine. Able to get weak TA recruitment following release at T/L junction. (from previous session)   Palpation: TTP to L Upper trapezius and supraspinatus.  Gait Analysis:  INTERVENTIONS THIS SESSION: Manual: Performed TP release, STM and MFR with cupping through the L posterior and superior shoulder to allow for improved mobility with decreased pain and spasm through the back and abdomen.  Dry-needle: Performed TPDN with a .25x23mm  needle and standard approach as described below to decrease spasm and pain and allow for improved balance of musculature for improved function and decreased symptoms.  Therex: Educated on and practiced shoulder ER to improve stability to allow for decreased spasm and pain. Reinforced importance of performing hip-flexor stretches and pelvic tilts to decrease and maintain improved hip pain.  Total time: 60 min.                      Trigger Point Dry Needling - 08/25/19 0001    Consent Given? Yes    Muscles Treated Head and Neck Upper trapezius     Upper Trapezius Response Twitch reponse elicited;Palpable increased muscle length                  PT Short Term Goals - 07/21/19 1118      PT SHORT TERM GOAL #1   Title Patient will demonstrate improved pelvic alignment and balance of musculature surrounding the pelvis to facilitate decreased PFM spasms and decrease pelvic pain.    Baseline R anterior innominate rotation, L up-slip    Time 5    Period Weeks    Status Achieved    Target Date 07/28/19      PT SHORT TERM GOAL #2   Title Patient will report consistent use of foot-stool (squatty-potty) for positioning with BM to decrease pain with BM and intra-abdominal pressure.    Baseline Pt. having to wait a long times occasionally and has BM ~ every-other day with variable consisteency. Pain during BM    Time 5    Period Weeks    Status Achieved    Target Date 07/28/19      PT SHORT TERM GOAL #3   Title Pt. will demonstrate implementation of behavioral modifications such as fluid intake and use of Urge suppression technique to allow for decreased UUI/frequency.    Baseline Pt. urinating ~ every 30 min, sometimes only 5-10 min. later. As of 7/12: able to go 2-3 hours between urinating and no nocturia.    Time 4    Period Weeks    Status Achieved    Target Date 07/28/19             PT Long Term Goals - 07/21/19 1207      PT LONG TERM GOAL #1   Title Patient will report no episodes of SUI over the course of the prior two weeks to demonstrate improved functional ability.    Baseline SUI with cough/sneeze As of 7/12: Pt. only having SUI when she has delayed emptying and sneezes, not much fluid loss.    Time 10    Period Weeks    Status On-going    Target Date 09/29/19      PT LONG TERM GOAL #2   Title Patient will describe pain no greater than 2/0 after doing ~ 1 hr of physical activity such as cleaning, walking, playing with kids, etc. demonstrate improved functional ability.    Baseline Pt. has increased LBP and  pelvic pain with walking and standing with a high of 8/10. As of 7/12: Pt. able to go 1 week without increase pain over 2/10 but not 2 weeks due to LLD.    Time 10    Period Weeks    Status On-going    Target Date 09/29/19      PT LONG TERM GOAL #3   Title Patient will report having BM's at least every-other day with consistency between  [facility] stool scale 3-5 and no pain over the prior week to demonstrate decreased constipation.    Baseline Pt having BM's every-other day but consistency varies and it can be painful.    Time 10    Period Weeks    Status Achieved    Target Date 09/01/19      PT LONG TERM GOAL #4   Title Pt. will improve in FOTO score by 10 points (or maximal score) in each category to demonstrate improved function.    Baseline FOTO PFDIPain: 13, Urinary Problem: 60, PFDI Urinary:13    Time 10    Period Weeks    Status On-going    Target Date 09/29/19                 Plan - 08/25/19 1156    Clinical Impression Statement Pt. Responded well to all interventions today, demonstrating improved fascial mobility, decreased spasm and pain, as well as understanding and correct performance of all education and exercises provided today. They will continue to benefit from skilled physical therapy to work toward remaining goals and maximize function as well as decrease likelihood of symptom increase or recurrence.    PT Next Visit Plan re-assess goals/POC and Perform MF release to hip-flexors, review posterior pelvic tilts and progress deep-core and add glute strengthening. re-assess sacral mobility. Give LB stretches if able to feel.    PT Home Exercise Plan diaphragmatic breathing, side-stretch, proposal pose stretch, posterior pelvic tilts in hook-lying, heel-lift for RLE, seated rows, prone and seated chin-tucks    Consulted and Agree with Plan of Care Patient           Patient will benefit from skilled therapeutic intervention in order to improve the following deficits  and impairments:     Visit Diagnosis: Sacrococcygeal disorders, not elsewhere classified  Other muscle spasm  Abnormal posture     Problem List Patient Active Problem List   Diagnosis Date Noted  . AFI (amniotic fluid index) increased 08/03/2018  . Non-reassuring electronic fetal monitoring tracing 08/02/2018  . Anemia of pregnancy in third trimester 08/02/2018  . Prior fetal macrosomia, antepartum 07/16/2018  . History of twin pregnancy in prior pregnancy 12/21/2017  . Sinus tachycardia 01/12/2015  . Menometrorrhagia 08/19/2014  . History of ovarian cyst 08/19/2014   Cleophus Molt DPT, ATC Cleophus Molt 08/25/2019, 12:06 PM  [place] [facility] MAIN [facility] SERVICES [address] [place], [name], [address] Phone: [phone]   Fax:  [phone]  Name: Colleen Lang MRN: [id number] Date of Birth: January 02, 1996

## 2019-08-25 NOTE — Patient Instructions (Signed)
  Do 3x10 exhaling as you rotate out and feeling the shoulder blades draw in to the middle slightly.

## 2019-09-09 ENCOUNTER — Ambulatory Visit: Payer: Medicaid Other

## 2019-09-23 ENCOUNTER — Ambulatory Visit: Payer: Medicaid Other | Attending: Obstetrics and Gynecology

## 2019-09-30 ENCOUNTER — Ambulatory Visit: Payer: Medicaid Other

## 2019-10-14 ENCOUNTER — Ambulatory Visit (INDEPENDENT_AMBULATORY_CARE_PROVIDER_SITE_OTHER): Payer: Medicaid Other | Admitting: Obstetrics and Gynecology

## 2019-10-14 ENCOUNTER — Other Ambulatory Visit (HOSPITAL_COMMUNITY)
Admission: RE | Admit: 2019-10-14 | Discharge: 2019-10-14 | Disposition: A | Discharge status: Home / Self Care | Payer: Medicaid Other | Source: Ambulatory Visit | Attending: Obstetrics and Gynecology | Admitting: Obstetrics and Gynecology

## 2019-10-14 ENCOUNTER — Other Ambulatory Visit: Payer: Self-pay

## 2019-10-14 ENCOUNTER — Encounter: Payer: Self-pay | Admitting: Obstetrics and Gynecology

## 2019-10-14 VITALS — BP 114/83 | HR 97 | Ht 67.0 in | Wt 186.6 lb

## 2019-10-14 DIAGNOSIS — N898 Other specified noninflammatory disorders of vagina: Secondary | ICD-10-CM | POA: Diagnosis not present

## 2019-10-14 DIAGNOSIS — R350 Frequency of micturition: Secondary | ICD-10-CM

## 2019-10-14 DIAGNOSIS — R8290 Unspecified abnormal findings in urine: Secondary | ICD-10-CM | POA: Diagnosis not present

## 2019-10-14 LAB — POCT URINALYSIS DIPSTICK
Bilirubin, UA: NEGATIVE
Blood, UA: NEGATIVE
Glucose, UA: NEGATIVE
Ketones, UA: NEGATIVE
Nitrite, UA: NEGATIVE
Protein, UA: NEGATIVE
Spec Grav, UA: 1.01 (ref 1.010–1.025)
Urobilinogen, UA: 0.2 E.U./dL
pH, UA: 7 (ref 5.0–8.0)

## 2019-10-14 NOTE — Addendum Note (Signed)
Addended by: Dorian Pod on: 10/14/2019 11:19 AM   Modules accepted: Orders

## 2019-10-14 NOTE — Addendum Note (Signed)
Addended by: Dorian Pod on: 10/14/2019 11:38 AM   Modules accepted: Orders

## 2019-10-14 NOTE — Progress Notes (Signed)
HPI:      Colleen Lang is a 24 y.o. [phone] who LMP was Patient's last menstrual period was 09/18/2019.  Subjective:   She presents today complaining of a 2-week history of increased vaginal discharge.  States that it has a slight odor but is mostly asymptomatic.  She also states that she has been getting up multiple times during the night to urinate which is a change for her. She had her Nexplanon removed and she is happy with her normal regular cycles and does not desire any form of birth control at this time. She is not concerned regarding her sexual partner or STD risk.    Hx: The following portions of the patient's history were reviewed and updated as appropriate:             She  has a past medical history of AR (allergic rhinitis), History of chlamydia infection (2014), Ovarian cyst, and Pelvic pain in female. She does not have any pertinent problems on file. She  has a past surgical history that includes Knee surgery; Wisdom tooth extraction; and Tonsillectomy. Her family history includes Diabetes in her paternal grandfather and paternal grandmother; Healthy in her father; Thyroid disease in her mother. She  reports that she has quit smoking. Her smoking use included cigarettes. She smoked 0.25 packs per day. She has quit using smokeless tobacco. She reports previous alcohol use. She reports that she does not use drugs. She currently has no medications in their medication list. She is allergic to tramadol, ultram [tramadol hcl], and other.       Review of Systems:  Review of Systems  Constitutional: Denied constitutional symptoms, night sweats, recent illness, fatigue, fever, insomnia and weight loss.  Eyes: Denied eye symptoms, eye pain, photophobia, vision change and visual disturbance.  Ears/Nose/Throat/Neck: Denied ear, nose, throat or neck symptoms, hearing loss, nasal discharge, sinus congestion and sore throat.  Cardiovascular: Denied cardiovascular symptoms, arrhythmia,  chest pain/pressure, edema, exercise intolerance, orthopnea and palpitations.  Respiratory: Denied pulmonary symptoms, asthma, pleuritic pain, productive sputum, cough, dyspnea and wheezing.  Gastrointestinal: Denied, gastro-esophageal reflux, melena, nausea and vomiting.  Genitourinary: See HPI for additional information.  Musculoskeletal: Denied musculoskeletal symptoms, stiffness, swelling, muscle weakness and myalgia.  Dermatologic: Denied dermatology symptoms, rash and scar.  Neurologic: Denied neurology symptoms, dizziness, headache, neck pain and syncope.  Psychiatric: Denied psychiatric symptoms, anxiety and depression.  Endocrine: Denied endocrine symptoms including hot flashes and night sweats.   Meds:   No current outpatient medications on file prior to visit.   No current facility-administered medications on file prior to visit.    Objective:     Vitals:   10/14/19 1049  BP: 114/83  Pulse: 97   Filed Weights   10/14/19 1049  Weight: 186 lb 9.6 oz (84.6 kg)              Physical examination   Pelvic:   Vulva: Normal appearance.  No lesions.  Vagina: No lesions or abnormalities noted.  Support: Normal pelvic support.  Urethra No masses tenderness or scarring.  Meatus Normal size without lesions or prolapse.  Cervix: Normal appearance.  No lesions.  Anus: Normal exam.  No lesions.  Perineum: Normal exam.  No lesions.        Bimanual   Uterus: Normal size.  Non-tender.  Mobile.  AV.  Adnexae: No masses.  Non-tender to palpation.  Cul-de-sac: Negative for abnormality.   Urinalysis reveals increased (moderate) leukocytes only.  Assessment:    [id number]  Patient Active Problem List   Diagnosis Date Noted   AFI (amniotic fluid index) increased 08/03/2018   Non-reassuring electronic fetal monitoring tracing 08/02/2018   Anemia of pregnancy in third trimester 08/02/2018   Prior fetal macrosomia, antepartum 07/16/2018   History of twin pregnancy in prior  pregnancy 12/21/2017   Sinus tachycardia 01/12/2015   Menometrorrhagia 08/19/2014   History of ovarian cyst 08/19/2014     1. Abnormal urine odor   2. Vaginal itching   3. Urinary frequency        Plan:            1.  NuSwab performed will contact patient with results  2.  Urine sent for C&S.  3.  Discussed birth control methods and patient not interested at this time. Orders Orders Placed This Encounter  Procedures   POCT urinalysis dipstick    No orders of the defined types were placed in this encounter.     F/U  No follow-ups on file. I spent 21 minutes involved in the care of this patient preparing to see the patient by obtaining and reviewing her medical history (including labs, imaging tests and prior procedures), documenting clinical information in the electronic health record (EHR), counseling and coordinating care plans, writing and sending prescriptions, ordering tests or procedures and directly communicating with the patient by discussing pertinent items from her history and physical exam as well as detailing my assessment and plan as noted above so that she has an informed understanding.  All of her questions were answered.  Elonda Husky, M.D. 10/14/2019 11:07 AM

## 2019-10-15 LAB — CERVICOVAGINAL ANCILLARY ONLY
Bacterial Vaginitis (gardnerella): POSITIVE — AB
Candida Glabrata: NEGATIVE
Candida Vaginitis: NEGATIVE
Comment: NEGATIVE
Comment: NEGATIVE
Comment: NEGATIVE

## 2019-10-16 ENCOUNTER — Other Ambulatory Visit: Payer: Self-pay | Admitting: Surgical

## 2019-10-16 LAB — URINE CULTURE

## 2019-10-16 MED ORDER — METRONIDAZOLE 500 MG PO TABS: 500.0000 mg | ORAL_TABLET | Freq: Two times a day (BID) | ORAL | 0 refills | Status: DC

## 2019-10-27 ENCOUNTER — Ambulatory Visit: Payer: Medicaid Other

## 2019-11-13 ENCOUNTER — Ambulatory Visit (INDEPENDENT_AMBULATORY_CARE_PROVIDER_SITE_OTHER): Payer: Medicaid Other | Admitting: Obstetrics and Gynecology

## 2019-11-13 ENCOUNTER — Encounter: Payer: Self-pay | Admitting: Obstetrics and Gynecology

## 2019-11-13 ENCOUNTER — Other Ambulatory Visit: Payer: Self-pay

## 2019-11-13 VITALS — BP 105/77 | HR 98 | Ht 67.0 in | Wt 185.7 lb

## 2019-11-13 DIAGNOSIS — N644 Mastodynia: Secondary | ICD-10-CM | POA: Diagnosis not present

## 2019-11-13 LAB — POCT URINE PREGNANCY: Preg Test, Ur: NEGATIVE

## 2019-11-13 NOTE — Progress Notes (Signed)
HPI:      [name] is a 24 y.o. [phone] who LMP was Patient's last menstrual period was 10/17/2019.  Subjective:   She presents today because she states over the last 2 weeks she has noted increasing pain in both breasts.  They are tender enough that she does not wear her normal bra.  She says that over the last few days she has also developed what appears to be a rash on her left breast.  She denies nipple discharge.  She reports no trauma to the breast.  Denies changing laundry detergent fabric softener etc. Of significant note patient is having normal regular menses.  She is not using anything for birth control.  She is sexually active.    Hx: The following portions of the patient's history were reviewed and updated as appropriate:             She  has a past medical history of AR (allergic rhinitis), History of chlamydia infection (2014), Ovarian cyst, and Pelvic pain in female. She does not have any pertinent problems on file. She  has a past surgical history that includes Knee surgery; Wisdom tooth extraction; and Tonsillectomy. Her family history includes Diabetes in her paternal grandfather and paternal grandmother; Healthy in her father; Thyroid disease in her mother. She  reports that she has quit smoking. Her smoking use included cigarettes. She smoked 0.25 packs per day. She has quit using smokeless tobacco. She reports previous alcohol use. She reports that she does not use drugs. She currently has no medications in their medication list. She is allergic to tramadol, ultram [tramadol hcl], and other.       Review of Systems:  Review of Systems  Constitutional: Denied constitutional symptoms, night sweats, recent illness, fatigue, fever, insomnia and weight loss.  Eyes: Denied eye symptoms, eye pain, photophobia, vision change and visual disturbance.  Ears/Nose/Throat/Neck: Denied ear, nose, throat or neck symptoms, hearing loss, nasal discharge, sinus congestion and sore  throat.  Cardiovascular: Denied cardiovascular symptoms, arrhythmia, chest pain/pressure, edema, exercise intolerance, orthopnea and palpitations.  Respiratory: Denied pulmonary symptoms, asthma, pleuritic pain, productive sputum, cough, dyspnea and wheezing.  Gastrointestinal: Denied, gastro-esophageal reflux, melena, nausea and vomiting.  Genitourinary: See HPI for additional information.  Musculoskeletal: Denied musculoskeletal symptoms, stiffness, swelling, muscle weakness and myalgia.  Dermatologic: Denied dermatology symptoms, rash and scar.  Neurologic: Denied neurology symptoms, dizziness, headache, neck pain and syncope.  Psychiatric: Denied psychiatric symptoms, anxiety and depression.  Endocrine: Denied endocrine symptoms including hot flashes and night sweats.   Meds:   No current outpatient medications on file prior to visit.   No current facility-administered medications on file prior to visit.          Objective:     Vitals:   11/13/19 1047  BP: 105/77  Pulse: 98   Filed Weights   11/13/19 1047  Weight: 185 lb 11.2 oz (84.2 kg)              Breast examination reveals some breast tenderness bilaterally.  Possible small fibrocystic breast change but no dominant masses.  There is no nipple discharge.  Bilateral nipple piercing. Left breast shows superficial erythematous area in the lower inner quadrant -appears to be more secondary to scraping or trauma rather than a true rash.  Urinary pregnancy test negative.  Assessment:    [id number] Patient Active Problem List   Diagnosis Date Noted  . AFI (amniotic fluid index) increased 08/03/2018  . Non-reassuring electronic fetal monitoring  tracing 08/02/2018  . Anemia of pregnancy in third trimester 08/02/2018  . Prior fetal macrosomia, antepartum 07/16/2018  . History of twin pregnancy in prior pregnancy 12/21/2017  . Sinus tachycardia 01/12/2015  . Menometrorrhagia 08/19/2014  . History of ovarian cyst  08/19/2014     1. Breast tenderness in female        Plan:            1.  Urinary pregnancy test to rule out breast tenderness secondary to pregnancy.  2.  Serum prolactin  3.  Expectant management through next menstrual period and reassess if pain does not resolve. Orders No orders of the defined types were placed in this encounter.   No orders of the defined types were placed in this encounter.     F/U  No follow-ups on file. I spent 22 minutes involved in the care of this patient preparing to see the patient by obtaining and reviewing her medical history (including labs, imaging tests and prior procedures), documenting clinical information in the electronic health record (EHR), counseling and coordinating care plans, writing and sending prescriptions, ordering tests or procedures and directly communicating with the patient by discussing pertinent items from her history and physical exam as well as detailing my assessment and plan as noted above so that she has an informed understanding.  All of her questions were answered.  Elonda Husky, M.D. 11/13/2019 11:12 AM

## 2019-11-13 NOTE — Addendum Note (Signed)
Addended by: Dorian Pod on: 11/13/2019 11:23 AM   Modules accepted: Orders

## 2019-11-14 LAB — PROLACTIN: Prolactin: 8.9 ng/mL (ref 4.8–23.3)

## 2019-11-28 ENCOUNTER — Other Ambulatory Visit: Payer: Self-pay

## 2019-11-28 ENCOUNTER — Ambulatory Visit (INDEPENDENT_AMBULATORY_CARE_PROVIDER_SITE_OTHER): Payer: Medicaid Other | Admitting: Obstetrics and Gynecology

## 2019-11-28 ENCOUNTER — Encounter: Payer: Self-pay | Admitting: Obstetrics and Gynecology

## 2019-11-28 VITALS — BP 111/80 | HR 93 | Ht 67.0 in | Wt 188.6 lb

## 2019-11-28 DIAGNOSIS — N644 Mastodynia: Secondary | ICD-10-CM

## 2019-11-28 NOTE — Progress Notes (Signed)
HPI:      [name] is a 24 y.o. [phone] who LMP was No LMP recorded.  Subjective:   She presents today for follow-up of breast tenderness.  She continues to experience bilateral breast tenderness mostly under both breasts/lower half of breast.  Her rash has completely resolved.  Once again I have questioned her regarding trauma to the breasts, increased exercise, caffeine intake.  None of these seem significant as a contributing factor. She has been on OCPs before but had a bad experience.  She is not on any form of hormones at this time. She is currently not wearing a bra (sports bra).  Because of the tenderness.  She denies nipple discharge. She is having normal regular menstrual periods    Hx: The following portions of the patient's history were reviewed and updated as appropriate:             She  has a past medical history of AR (allergic rhinitis), History of chlamydia infection (2014), Ovarian cyst, and Pelvic pain in female. She does not have any pertinent problems on file. She  has a past surgical history that includes Knee surgery; Wisdom tooth extraction; and Tonsillectomy. Her family history includes Diabetes in her paternal grandfather and paternal grandmother; Healthy in her father; Thyroid disease in her mother. She  reports that she has quit smoking. Her smoking use included cigarettes. She smoked 0.25 packs per day. She has quit using smokeless tobacco. She reports previous alcohol use. She reports that she does not use drugs. She currently has no medications in their medication list. She is allergic to tramadol, ultram [tramadol hcl], and other.       Review of Systems:  Review of Systems  Constitutional: Denied constitutional symptoms, night sweats, recent illness, fatigue, fever, insomnia and weight loss.  Eyes: Denied eye symptoms, eye pain, photophobia, vision change and visual disturbance.  Ears/Nose/Throat/Neck: Denied ear, nose, throat or neck symptoms,  hearing loss, nasal discharge, sinus congestion and sore throat.  Cardiovascular: Denied cardiovascular symptoms, arrhythmia, chest pain/pressure, edema, exercise intolerance, orthopnea and palpitations.  Respiratory: Denied pulmonary symptoms, asthma, pleuritic pain, productive sputum, cough, dyspnea and wheezing.  Gastrointestinal: Denied, gastro-esophageal reflux, melena, nausea and vomiting.  Genitourinary: See HPI for additional information.  Musculoskeletal: Denied musculoskeletal symptoms, stiffness, swelling, muscle weakness and myalgia.  Dermatologic: Denied dermatology symptoms, rash and scar.  Neurologic: Denied neurology symptoms, dizziness, headache, neck pain and syncope.  Psychiatric: Denied psychiatric symptoms, anxiety and depression.  Endocrine: Denied endocrine symptoms including hot flashes and night sweats.   Meds:   No current outpatient medications on file prior to visit.   No current facility-administered medications on file prior to visit.       The pregnancy intention screening data noted above was reviewed. Potential methods of contraception were discussed. The patient elected to proceed with No Method - No Contraceptive Precautions.     Objective:     Vitals:   11/28/19 0953  BP: 111/80  Pulse: 93   Filed Weights   11/28/19 0953  Weight: 188 lb 9.6 oz (85.5 kg)                Assessment:    [id number] Patient Active Problem List   Diagnosis Date Noted  . AFI (amniotic fluid index) increased 08/03/2018  . Non-reassuring electronic fetal monitoring tracing 08/02/2018  . Anemia of pregnancy in third trimester 08/02/2018  . Prior fetal macrosomia, antepartum 07/16/2018  . History of twin pregnancy in prior  pregnancy 12/21/2017  . Sinus tachycardia 01/12/2015  . Menometrorrhagia 08/19/2014  . History of ovarian cyst 08/19/2014     1. Breast tenderness in female     Colleen Lang's has remained despite going through a typical menstrual period.  See  HPI. Prolactin and pregnancy test negative for abnormality.   Plan:            1.  I discussed the possibility of OCPs with the patient and she does not want to start these because of prior issues.  2.  Up-to-date recommendations reviewed with patient.  Use of evening primrose oil and vitamin D discussed.  She will try these methods. If she continues to experience breast tenderness she is willing to consider OCPs for a few months.  If she decides upon this she will contact Colleen Lang and we will prescribed OCPs. Orders No orders of the defined types were placed in this encounter.   No orders of the defined types were placed in this encounter.     F/U  Return for Pt to contact us if symptoms worsen. I spent 24 minutes involved in the care of this patient preparing to see the patient by obtaining and reviewing her medical history (including labs, imaging tests and prior procedures), documenting clinical information in the electronic health record (EHR), counseling and coordinating care plans, writing and sending prescriptions, ordering tests or procedures and directly communicating with the patient by discussing pertinent items from her history and physical exam as well as detailing my assessment and plan as noted above so that she has an informed understanding.  All of her questions were answered.  Elonda Husky, M.D. 11/28/2019 10:30 AM

## 2019-12-02 ENCOUNTER — Ambulatory Visit
Admission: EM | Admit: 2019-12-02 | Discharge: 2019-12-02 | Disposition: A | Discharge status: Home / Self Care | Payer: Medicaid Other | Attending: Family Medicine | Admitting: Family Medicine

## 2019-12-02 ENCOUNTER — Other Ambulatory Visit: Payer: Self-pay

## 2019-12-02 ENCOUNTER — Encounter: Payer: Self-pay | Admitting: Emergency Medicine

## 2019-12-02 DIAGNOSIS — U071 COVID-19: Secondary | ICD-10-CM | POA: Diagnosis not present

## 2019-12-02 LAB — GROUP A STREP BY PCR: Group A Strep by PCR: NOT DETECTED

## 2019-12-02 LAB — RESP PANEL BY RT-PCR (FLU A&B, COVID) ARPGX2
Influenza A by PCR: NEGATIVE
Influenza B by PCR: NEGATIVE
SARS Coronavirus 2 by RT PCR: POSITIVE — AB

## 2019-12-02 MED ORDER — BENZONATATE 200 MG PO CAPS: 200.0000 mg | ORAL_CAPSULE | Freq: Three times a day (TID) | ORAL | 0 refills | Status: DC | PRN

## 2019-12-02 MED ORDER — KETOROLAC TROMETHAMINE 10 MG PO TABS: 10.0000 mg | ORAL_TABLET | Freq: Four times a day (QID) | ORAL | 0 refills | Status: DC | PRN

## 2019-12-02 NOTE — ED Triage Notes (Signed)
Patient in today c/o cough, nasal congestion, sore throat and fever x 2 days. Patient has taken OTC Tylenol. Patient's husband is covid positive.

## 2019-12-02 NOTE — Discharge Instructions (Addendum)
Medication as directed.  We will call with results.  Take care  Dr. Zyquan Crotty  

## 2019-12-02 NOTE — ED Provider Notes (Signed)
[facility]    CSN: [id number] Arrival date & time: 12/02/19  1638      History   Chief Complaint Chief Complaint  Patient presents with  . Cough  . Nasal Congestion  . Sore Throat  . Fever   HPI  24 year old female presents with the above complaints.  Patient states that she is not been feeling well for the past 2 days.  Husband has Covid.  Children are also sick.  She reports cough, congestion, sore throat, low-grade temperature.  Patient also reports rib pain.  No current fever at this time.  She has taken Tylenol without relief.  No known exacerbating factors.  No other associated symptoms.  No other complaints.  Past Medical History:  Diagnosis Date  . AR (allergic rhinitis)   . History of chlamydia infection 2014  . Ovarian cyst    seen on u/s 6 months ago- per pt they were small  . Pelvic pain in female     Patient Active Problem List   Diagnosis Date Noted  . AFI (amniotic fluid index) increased 08/03/2018  . Non-reassuring electronic fetal monitoring tracing 08/02/2018  . Anemia of pregnancy in third trimester 08/02/2018  . Prior fetal macrosomia, antepartum 07/16/2018  . History of twin pregnancy in prior pregnancy 12/21/2017  . Sinus tachycardia 01/12/2015  . Menometrorrhagia 08/19/2014  . History of ovarian cyst 08/19/2014    Past Surgical History:  Procedure Laterality Date  . KNEE SURGERY     x2  . TONSILLECTOMY    . WISDOM TOOTH EXTRACTION      OB History    Gravida  3   Para  3   Term  2   Preterm  1   AB      Living  4     SAB      TAB      Ectopic      Multiple  1   Live Births  4            Home Medications    Prior to Admission medications   Medication Sig Start Date End Date Taking? Authorizing Provider  benzonatate (TESSALON) 200 MG capsule Take 1 capsule (200 mg total) by mouth 3 (three) times daily as needed for cough. 12/02/19   Tommie Sams, DO  ketorolac (TORADOL) 10 MG tablet Take 1  tablet (10 mg total) by mouth every 6 (six) hours as needed for moderate pain or severe pain. 12/02/19   Tommie Sams, DO    Family History Family History  Problem Relation Age of Onset  . Diabetes Paternal Grandmother   . Diabetes Paternal Grandfather   . Thyroid disease Mother   . Healthy Father   . Cancer Neg Hx   . Ovarian cancer Neg Hx   . Breast cancer Neg Hx   . Heart disease Neg Hx     Social History Social History   Tobacco Use  . Smoking status: Former Smoker    Packs/day: 0.25    Types: Cigarettes    Quit date: 04/01/2019    Years since quitting: 0.6  . Smokeless tobacco: Former Clinical biochemist  . Vaping Use: Never used  Substance Use Topics  . Alcohol use: Yes    Comment: rare  . Drug use: No     Allergies   Tramadol, Ultram [tramadol hcl], and Other   Review of Systems Review of Systems Per HPI   Physical Exam Triage Vital Signs ED Triage  Vitals  Enc Vitals Group     BP 12/02/19 1701 124/89     Pulse Rate 12/02/19 1701 88     Resp 12/02/19 1701 18     Temp 12/02/19 1701 99 F (37.2 C)     Temp Source 12/02/19 1701 Oral     SpO2 12/02/19 1701 100 %     Weight 12/02/19 1702 184 lb (83.5 kg)     Height 12/02/19 1702 5\' 7"  (1.702 m)     Head Circumference --      Peak Flow --      Pain Score 12/02/19 1702 4     Pain Loc --      Pain Edu? --      Excl. in GC? --    Updated Vital Signs BP 124/89 (BP Location: Left Arm)   Pulse 88   Temp 99 F (37.2 C) (Oral)   Resp 18   Ht 5\' 7"  (1.702 m)   Wt 83.5 kg   LMP 11/17/2019 (Exact Date)   SpO2 100%   BMI 28.82 kg/m   Visual Acuity Right Eye Distance:   Left Eye Distance:   Bilateral Distance:    Right Eye Near:   Left Eye Near:    Bilateral Near:     Physical Exam Constitutional:      General: She is not in acute distress.    Appearance: Normal appearance. She is not ill-appearing.  HENT:     Head: Normocephalic and atraumatic.     Mouth/Throat:     Pharynx: Oropharynx is  clear. No oropharyngeal exudate or posterior oropharyngeal erythema.  Cardiovascular:     Rate and Rhythm: Normal rate and regular rhythm.     Heart sounds: No murmur heard.   Pulmonary:     Effort: Pulmonary effort is normal.     Breath sounds: Normal breath sounds.  Neurological:     Mental Status: She is alert.  Psychiatric:        Mood and Affect: Mood normal.        Behavior: Behavior normal.     UC Treatments / Results  Labs (all labs ordered are listed, but only abnormal results are displayed) Labs Reviewed  RESP PANEL BY RT-PCR (FLU A&B, COVID) ARPGX2 - Abnormal; Notable for the following components:      Result Value   SARS Coronavirus 2 by RT PCR POSITIVE (*)    All other components within normal limits  GROUP A STREP BY PCR    EKG   Radiology No results found.  Procedures Procedures (including critical care time)  Medications Ordered in UC Medications - No data to display  Initial Impression / Assessment and Plan / UC Course  I have reviewed the triage vital signs and the nursing notes.  Pertinent labs & imaging results that were available during my care of the patient were reviewed by me and considered in my medical decision making (see chart for details).    24 year old female presents with respiratory symptoms. COVID-19 positive. Declines infusion at this time. Toradol for pain. Tessalon Perles for cough.  Final Clinical Impressions(s) / UC Diagnoses   Final diagnoses:  COVID-19     Discharge Instructions     Medication as directed.  We will call with results.  Take care  Dr. 13/08/2019     ED Prescriptions    Medication Sig Dispense Auth. Provider   ketorolac (TORADOL) 10 MG tablet Take 1 tablet (10 mg total) by mouth every 6 (six)  hours as needed for moderate pain or severe pain. 20 tablet Saryn Cherry G, DO   benzonatate (TESSALON) 200 MG capsule Take 1 capsule (200 mg total) by mouth 3 (three) times daily as needed for cough. 30 capsule  Tommie Sams, DO     PDMP not reviewed this encounter.   Tommie Sams, [place] 12/02/19 [phone]

## 2019-12-03 ENCOUNTER — Telehealth: Payer: Self-pay | Admitting: Nurse Practitioner

## 2019-12-03 NOTE — Telephone Encounter (Signed)
Called to Discuss with patient about Covid symptoms and the use of the monoclonal antibody infusion for those with mild to moderate Covid symptoms and at a high risk of hospitalization.     Pt appears to qualify for this infusion due to co-morbid conditions and/or a member of an at-risk group in accordance with the FDA Emergency Use Authorization. BMI>25.    Unable to reach pt. Voicemail left and My Chart message sent.   Nikki Pickenpack-Cousar, NP [facility]  [phone]    

## 2020-02-11 ENCOUNTER — Encounter: Payer: Self-pay | Admitting: Emergency Medicine

## 2020-02-11 ENCOUNTER — Ambulatory Visit (INDEPENDENT_AMBULATORY_CARE_PROVIDER_SITE_OTHER): Payer: Medicaid Other

## 2020-02-11 ENCOUNTER — Other Ambulatory Visit: Payer: Self-pay

## 2020-02-11 ENCOUNTER — Ambulatory Visit
Admission: EM | Admit: 2020-02-11 | Discharge: 2020-02-11 | Disposition: A | Discharge status: Home / Self Care | Payer: Medicaid Other | Attending: Emergency Medicine | Admitting: Emergency Medicine

## 2020-02-11 DIAGNOSIS — M546 Pain in thoracic spine: Secondary | ICD-10-CM

## 2020-02-11 DIAGNOSIS — M25512 Pain in left shoulder: Secondary | ICD-10-CM | POA: Diagnosis not present

## 2020-02-11 MED ORDER — METHOCARBAMOL 500 MG PO TABS: 500.0000 mg | ORAL_TABLET | Freq: Two times a day (BID) | ORAL | 0 refills | Status: DC

## 2020-02-11 MED ORDER — PREDNISONE 10 MG (21) PO TBPK: ORAL_TABLET | Freq: Every day | ORAL | 0 refills | Status: DC

## 2020-02-11 NOTE — ED Triage Notes (Signed)
Patient c/o back pain that started 4 days ago. She denies any injury and states the pain is getting worse everyday. Patient states she has been taking Tylenol and Ibuprofen.

## 2020-02-11 NOTE — Discharge Instructions (Addendum)
Take the prednisone pack according to the package instructions.  I recommend you start it now and then take your dose each day with breakfast.  Take the methocarbamol every 6 hours on a schedule for the next 2 days then back down to an as-needed basis.  Back exercises given at discharge.  If your symptoms do resolve, or improved, you need to follow-up with orthopedics.
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
## 2020-03-03 IMAGING — US ULTRAOUND FETAL BPP W/O NONSTRESS
1 series · 14 of 28 positions shown · non-contrast
Comparison: none

CLINICAL DATA: Pelvic pressure. Pressure and vaginal bleeding since
07/20/2018. LMP of 10/24/2017. By LMP the patient is 40 weeks 0
days. EDC by LMP is 07/31/2018.

EXAM:
LIMITED OBSTETRIC ULTRASOUND AND BIOPHYSICAL PROFILE

[Series 1: ultraound fetal bpp w/o nonstress · 0.28mm/px · 36 acquisitions, 14 frames shown]
[im 2/36]
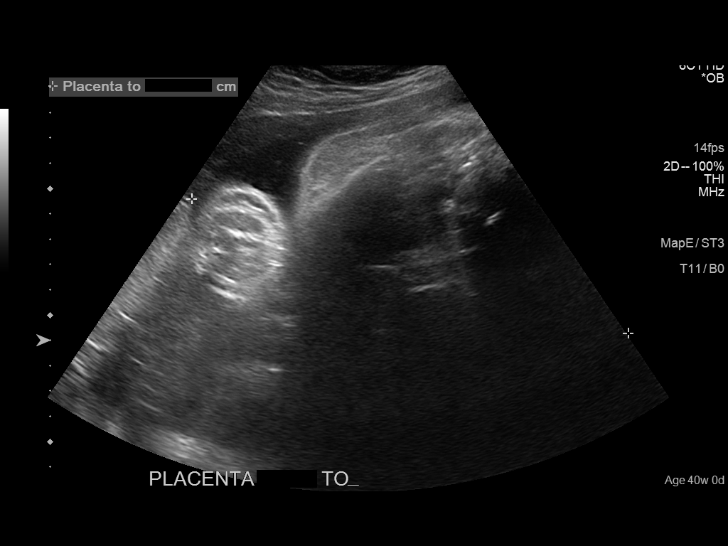
[im 4/36]
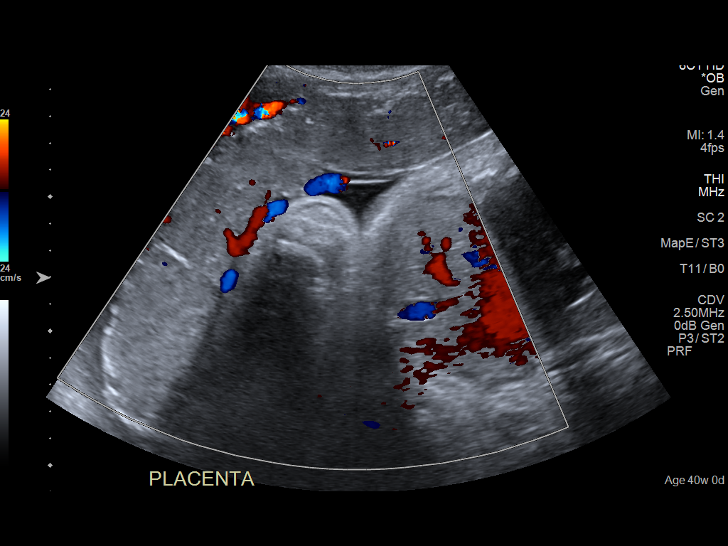
[im 7/36]
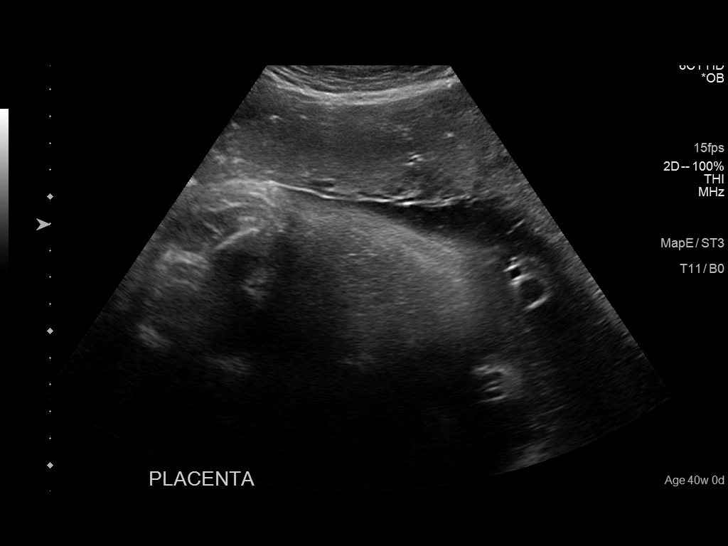
[im 10/36]
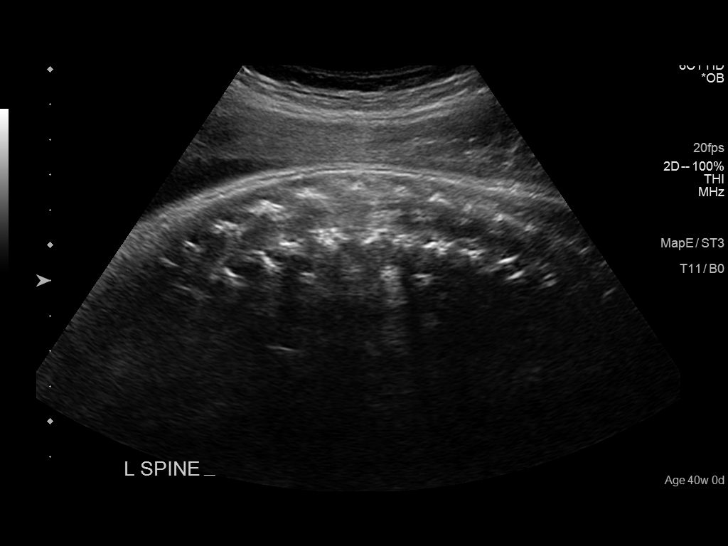
[im 12/36]
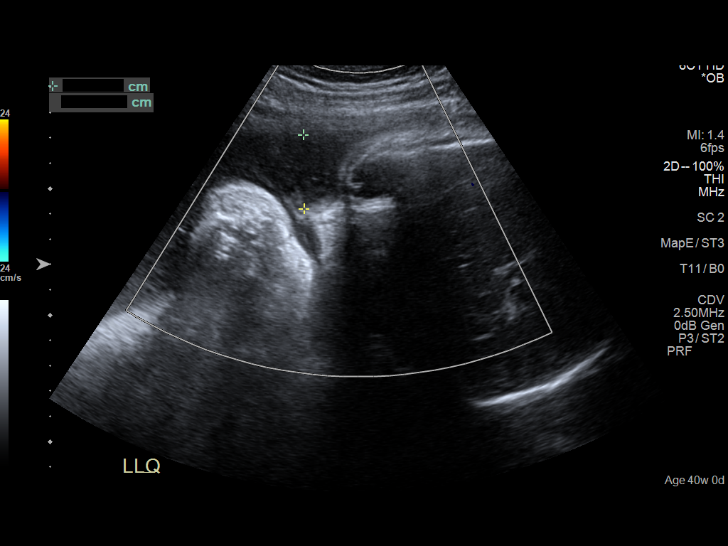
[im 15/36]
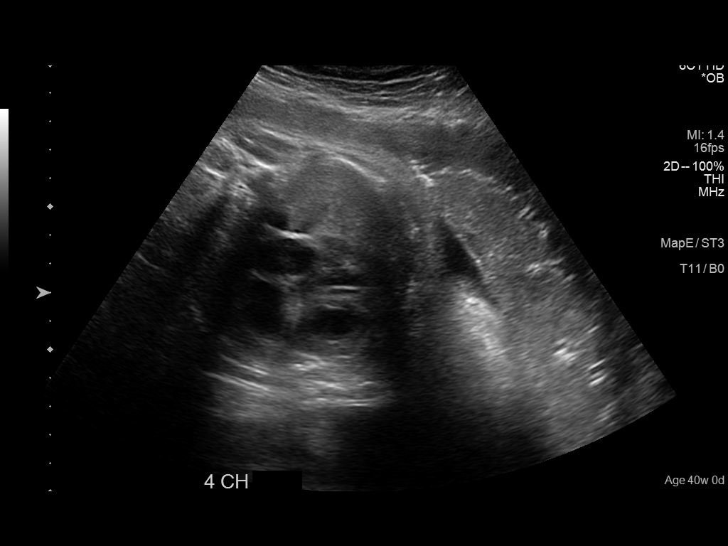
[im 17/36]
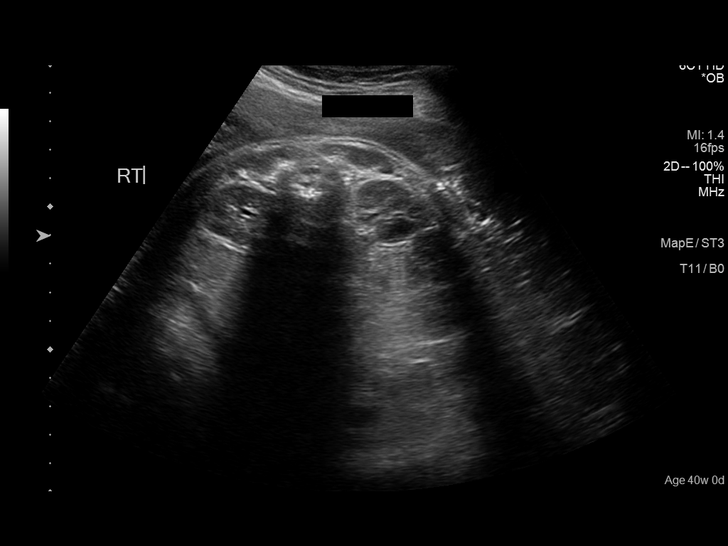
[im 20/36]
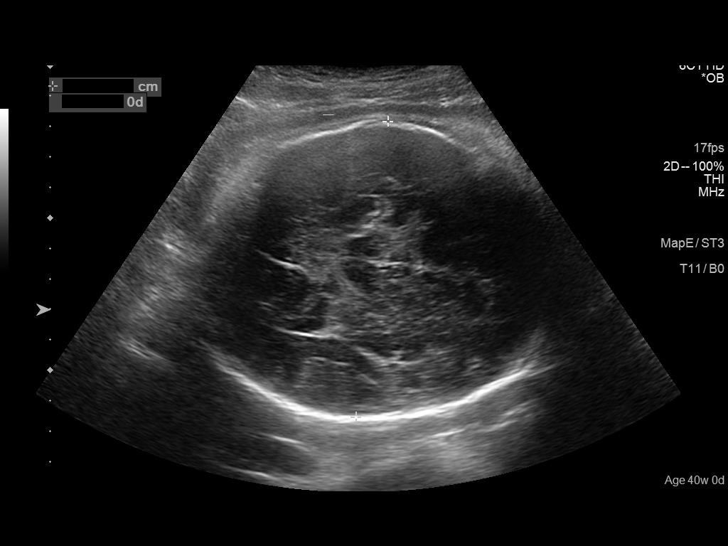
[im 23/36]
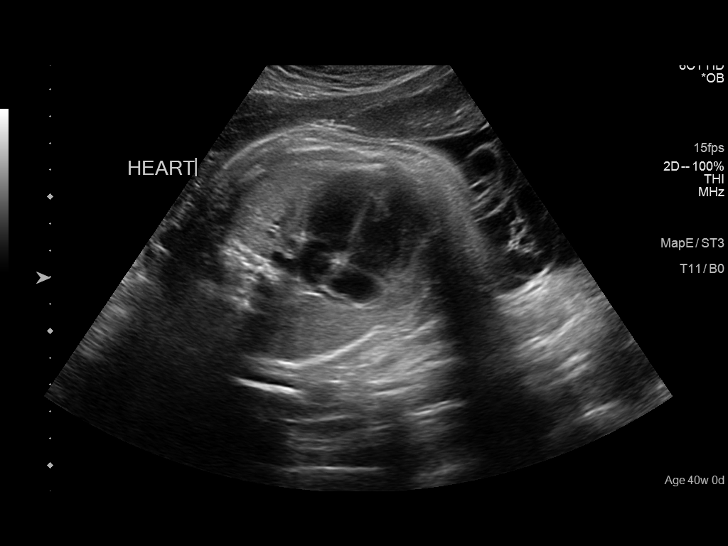
[im 25/36]
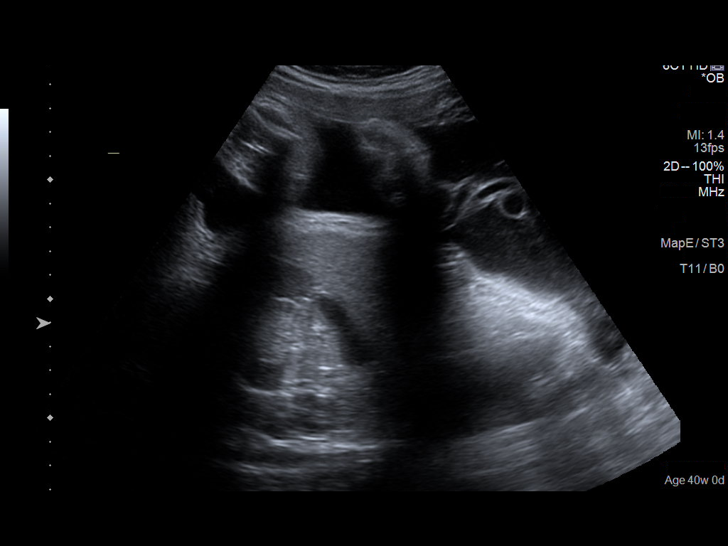
[im 28/36]
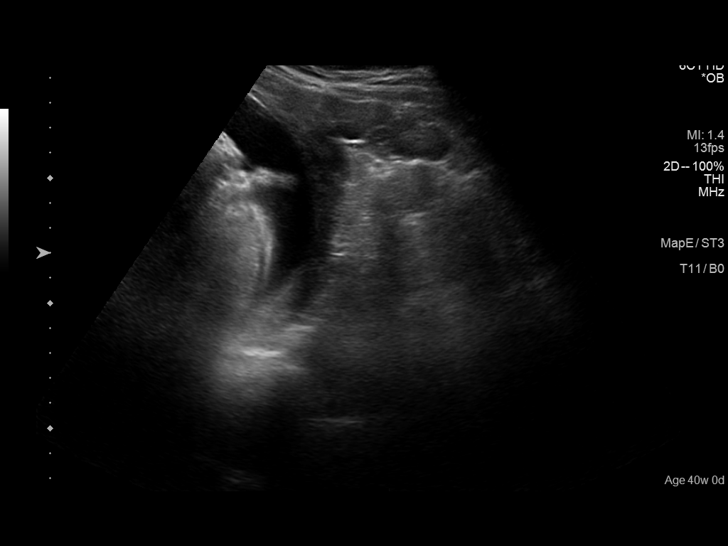
[im 30/36]
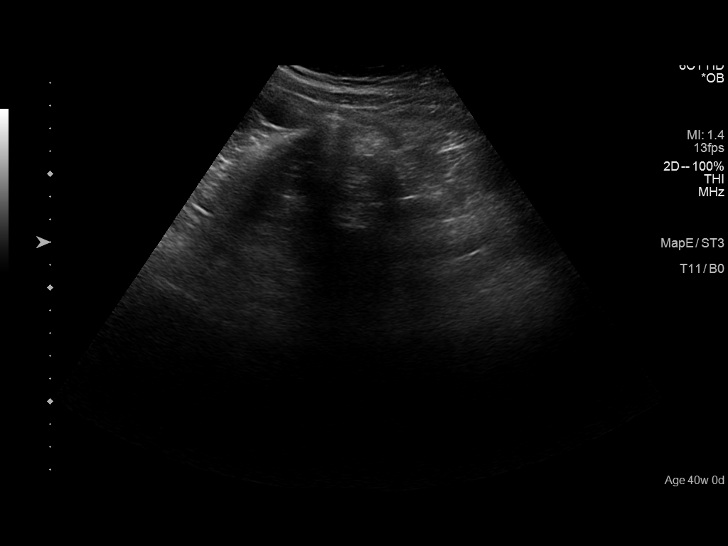
[im 33/36]
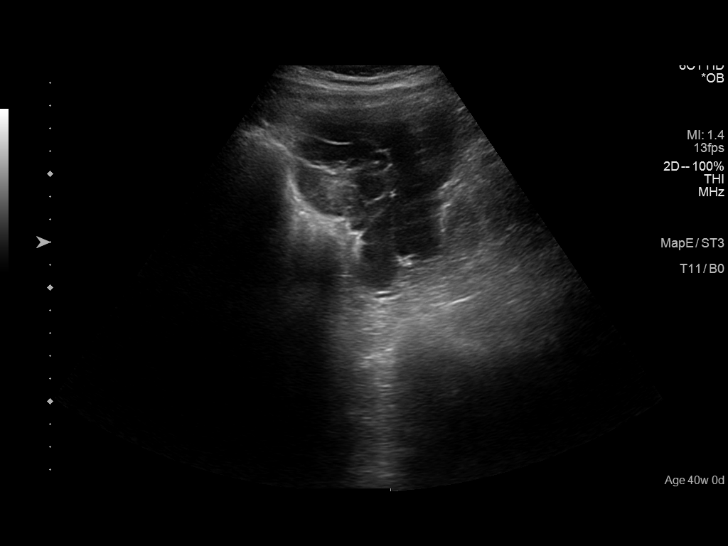
[im 36/36]
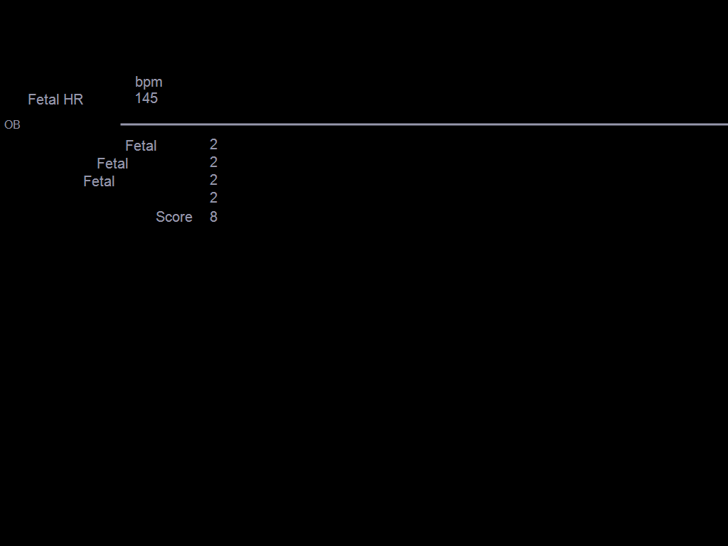

[14 of 28 positions shown; findings below may reference images not displayed]

FINDINGS: Number of Fetuses: 1

Heart Rate:  145 bpm

Movement: Present

Presentation: Cephalic

Placental Location: Anterior

Previa: None

Amniotic Fluid (Subjective):  Normal

AFI: 13.9 cm

BPD:  9.8cm 40w 2d

MATERNAL FINDINGS:

Cervix:  Closed

Uterus/Adnexae: No abnormality visualized.

Movement:  2  Time: 22 minutes

Breathing: 2

Tone:  2

Amniotic Fluid: 2

Total Score:  8
IMPRESSION: 1. Single living intrauterine fetus in cephalic presentation.
2. Normal amniotic fluid volume.
3. Biophysical profile score is [DATE].
4. This exam is performed on an emergent basis and does not
comprehensively evaluate fetal size, dating, or anatomy; follow-up
complete OB US should be considered if further fetal assessment is
warranted.

## 2020-03-05 IMAGING — US LIMITED OBSTETRIC ULTRASOUND
1 series · 14 of 24 positions shown · non-contrast
Comparison: none

CLINICAL DATA: Post-dates.

EXAM:
LIMITED OBSTETRIC ULTRASOUND

[Series 1: limited obstetric ultrasound · 14 of 24 slices shown]
[im 1/24]
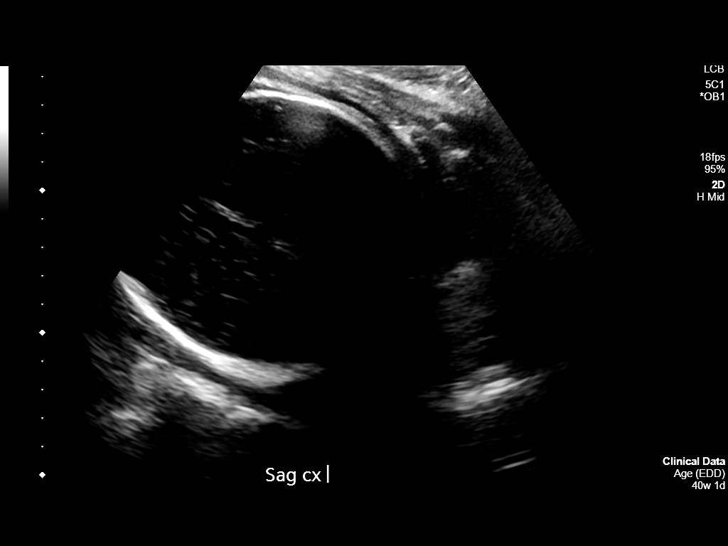
[im 3/24]
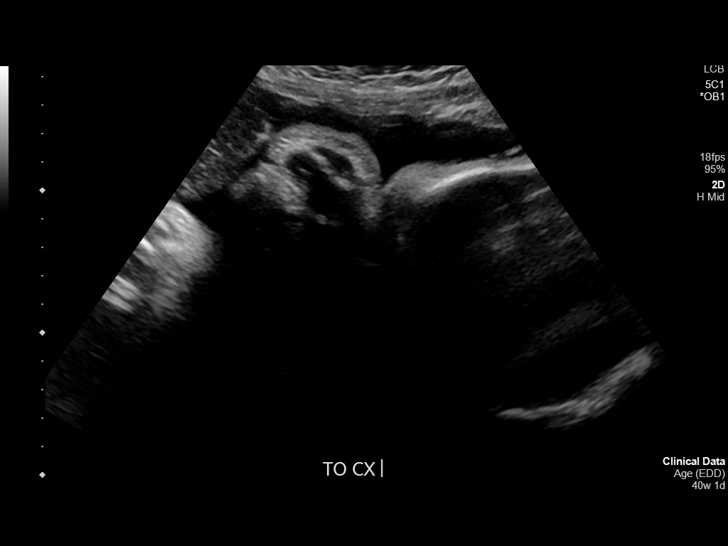
[im 5/24]
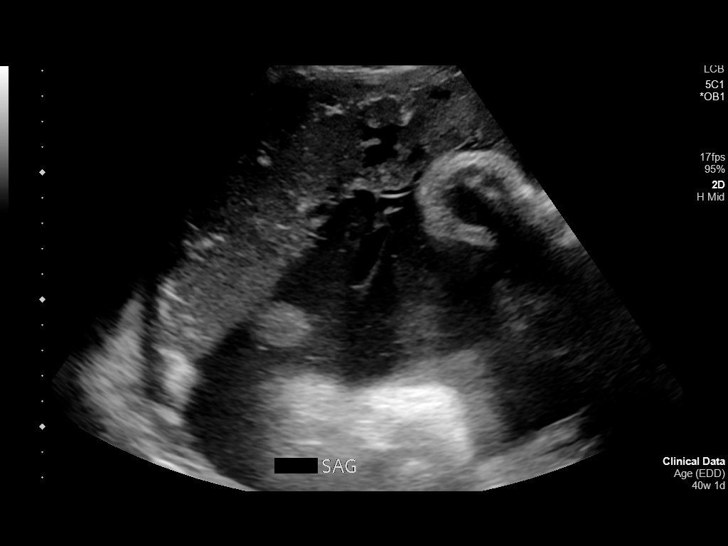
[im 7/24]
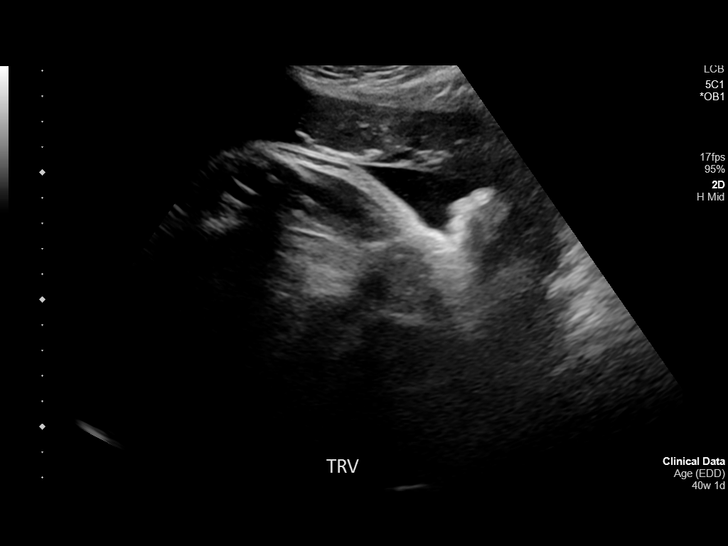
[im 8/24]
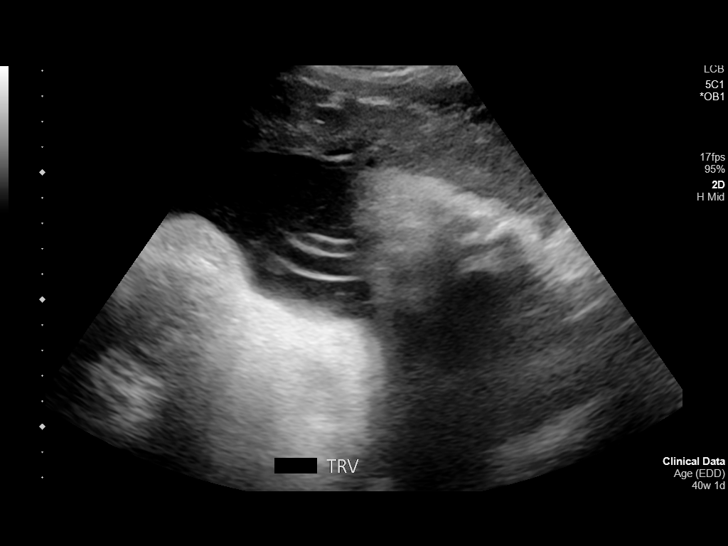
[im 10/24]
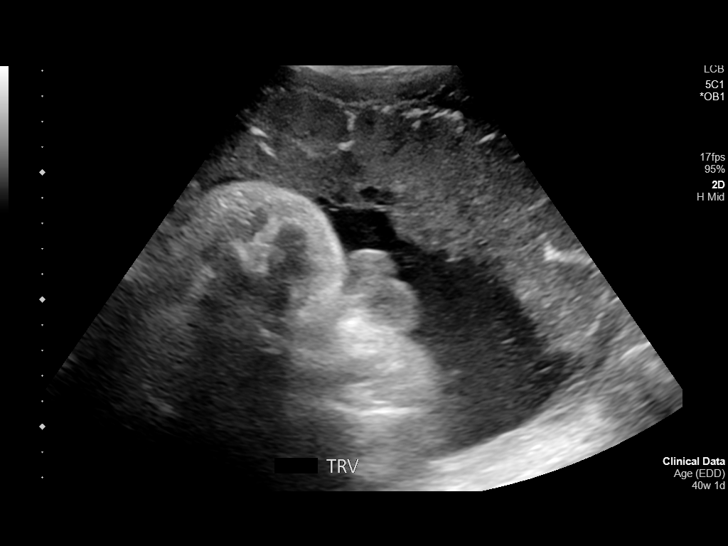
[im 12/24]
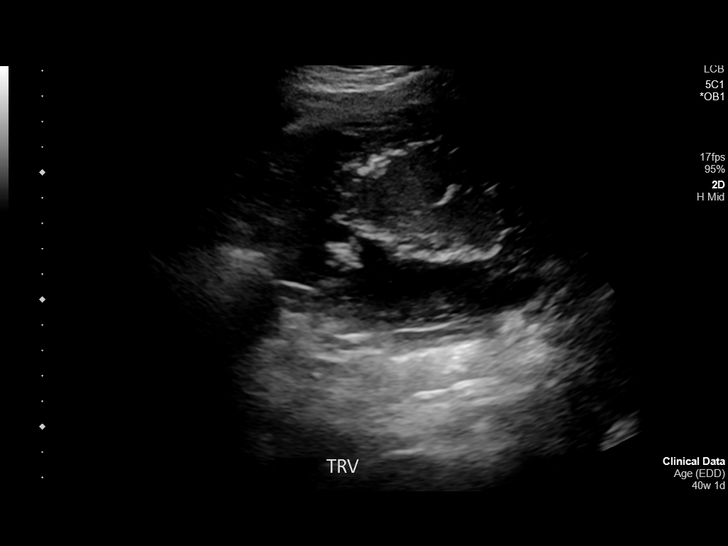
[im 13/24]
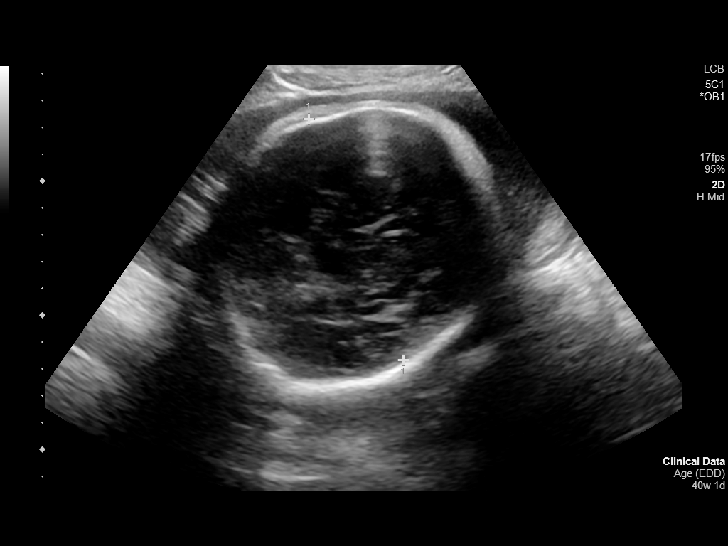
[im 15/24]
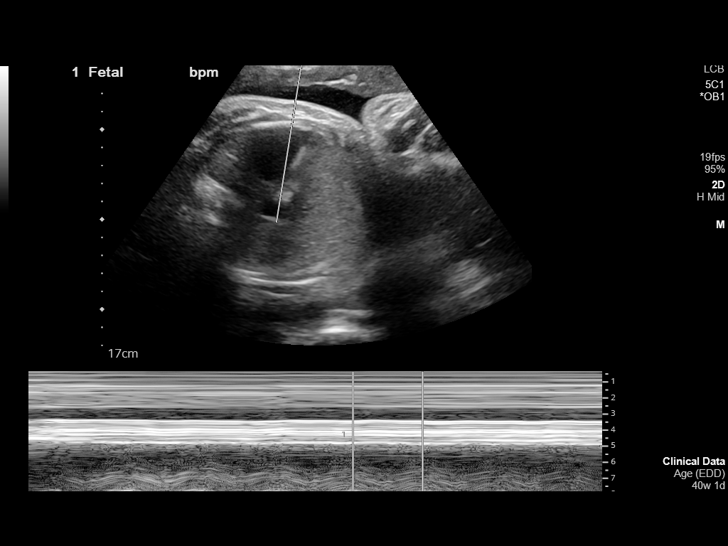
[im 17/24]
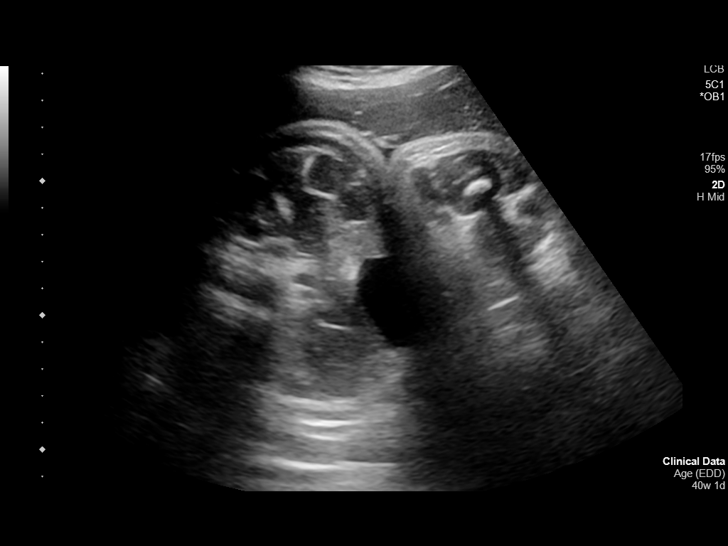
[im 19/24]
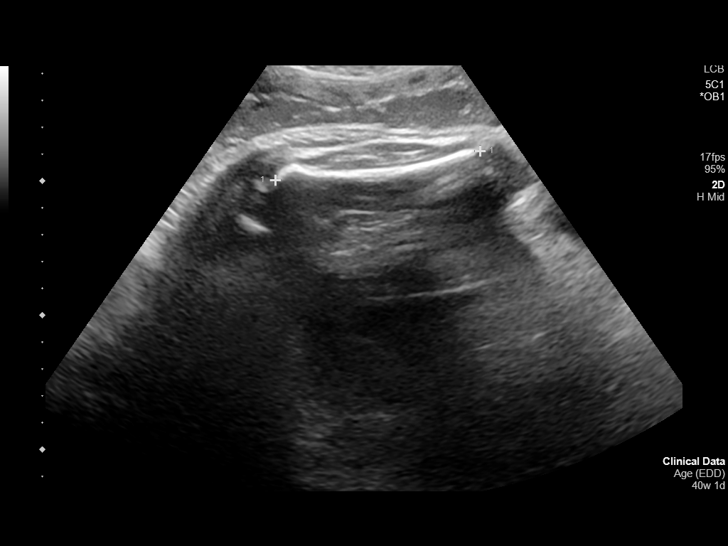
[im 20/24]
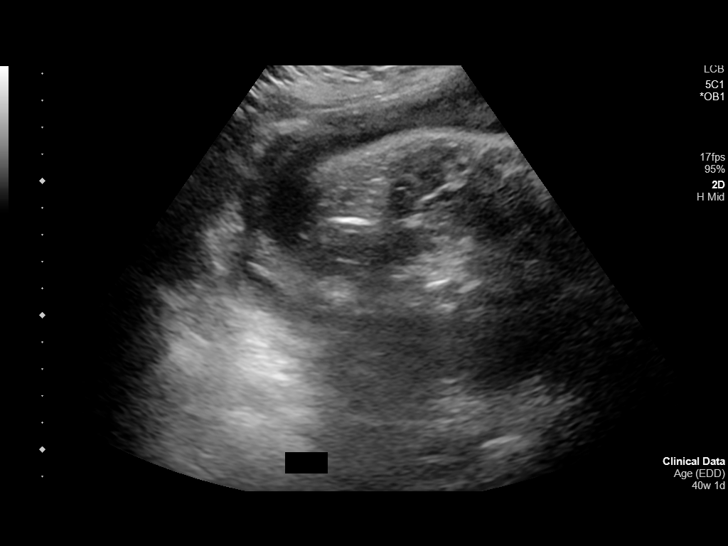
[im 22/24]
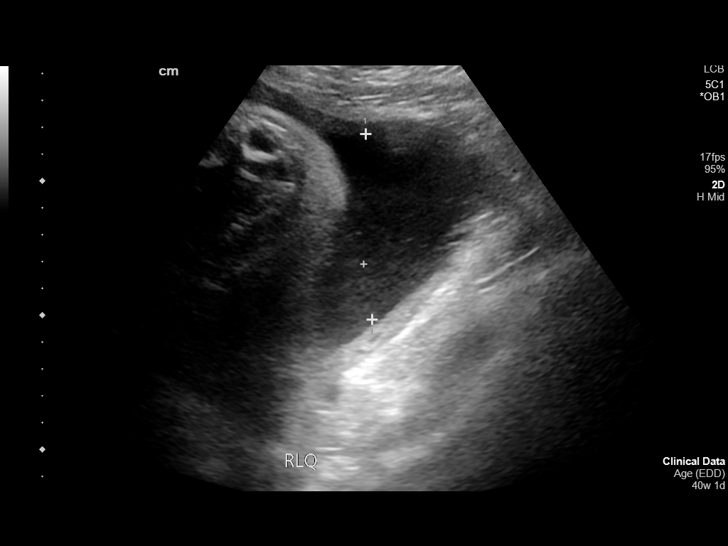
[im 24/24]
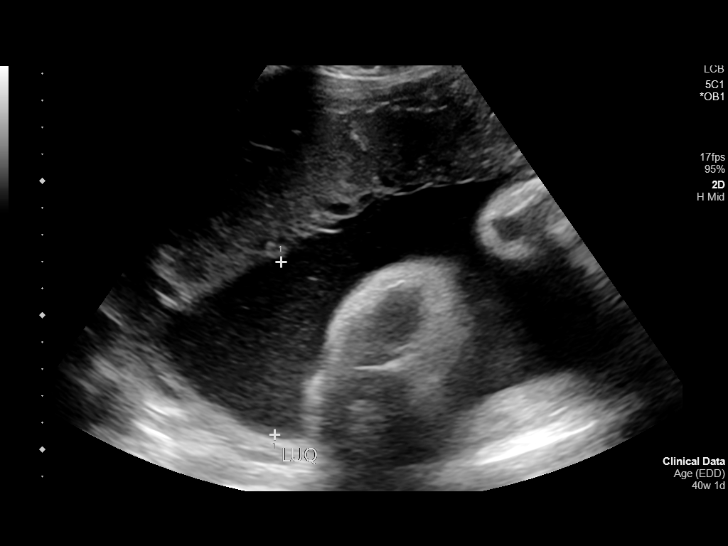

[14 of 24 positions shown; findings below may reference images not displayed]

FINDINGS: Number of Fetuses: 1

Heart Rate:  147 bpm

Movement: No

Presentation: Cephalic

Placental Location: Fundal

Previa: No

Amniotic Fluid (Subjective):  Within normal limits.

AFI: 20.4 cm; fifth percentile = 7.1 cm, ninety-fifth percentile =
21.4 cm.

BPD: 9.8 cm 40 w  1 d

MATERNAL FINDINGS:

Not well seen.
IMPRESSION: Single live intrauterine pregnancy corresponding to 40 weeks 1 day
gestation.

Normal amount of the amniotic fluid.

Please note that no fetal movement was noted during real-time
examination.

This exam is performed on an emergent basis and does not
comprehensively evaluate fetal size, dating, or anatomy; follow-up
complete OB US should be considered if further fetal assessment is
warranted.

## 2020-08-10 ENCOUNTER — Ambulatory Visit
Admission: EM | Admit: 2020-08-10 | Discharge: 2020-08-10 | Disposition: A | Discharge status: Home / Self Care | Payer: Medicaid Other | Attending: Physician Assistant | Admitting: Physician Assistant

## 2020-08-10 ENCOUNTER — Encounter: Payer: Self-pay | Admitting: Emergency Medicine

## 2020-08-10 ENCOUNTER — Other Ambulatory Visit: Payer: Self-pay

## 2020-08-10 DIAGNOSIS — M545 Low back pain, unspecified: Secondary | ICD-10-CM | POA: Diagnosis not present

## 2020-08-10 DIAGNOSIS — R300 Dysuria: Secondary | ICD-10-CM | POA: Diagnosis not present

## 2020-08-10 DIAGNOSIS — N3001 Acute cystitis with hematuria: Secondary | ICD-10-CM | POA: Diagnosis not present

## 2020-08-10 LAB — POCT URINALYSIS DIP (DEVICE)
Bilirubin Urine: NEGATIVE
Glucose, UA: NEGATIVE mg/dL
Ketones, ur: NEGATIVE mg/dL
Nitrite: NEGATIVE
Protein, ur: 30 mg/dL — AB
Specific Gravity, Urine: 1.025 (ref 1.005–1.030)
Urobilinogen, UA: 0.2 mg/dL (ref 0.0–1.0)
pH: 6 (ref 5.0–8.0)

## 2020-08-10 MED ORDER — NITROFURANTOIN MONOHYD MACRO 100 MG PO CAPS: 100.0000 mg | ORAL_CAPSULE | Freq: Two times a day (BID) | ORAL | 0 refills | Status: AC

## 2020-08-10 NOTE — Discharge Instructions (Addendum)

## 2020-08-10 NOTE — ED Provider Notes (Addendum)
[facility]    CSN: [id number] Arrival date & time: 08/10/20  1906      History   Chief Complaint Chief Complaint  Patient presents with   Dysuria    HPI Colleen Lang is a 25 y.o. female presenting for approximately 4-day history of dysuria, urinary frequency and urgency.  Also admits to left-sided lower back pain with occasional radiation to the left abdomen.  Patient says pain is worse toward the end of urination.  She denies any abnormal vaginal discharge, itching or odor.  She has not had any fever, fatigue, chills or sweats.  Patient does report that she accidentally fell over the weekend but did not necessarily hurt her back.  She has not been taking any OTC meds for any of her symptoms.  She has no other complaints or concerns.  HPI  Past Medical History:  Diagnosis Date   AR (allergic rhinitis)    History of chlamydia infection 2014   Ovarian cyst    seen on u/s 6 months ago- per pt they were small   Pelvic pain in female     Patient Active Problem List   Diagnosis Date Noted   AFI (amniotic fluid index) increased 08/03/2018   Non-reassuring electronic fetal monitoring tracing 08/02/2018   Anemia of pregnancy in third trimester 08/02/2018   Prior fetal macrosomia, antepartum 07/16/2018   History of twin pregnancy in prior pregnancy 12/21/2017   Sinus tachycardia 01/12/2015   Menometrorrhagia 08/19/2014   History of ovarian cyst 08/19/2014    Past Surgical History:  Procedure Laterality Date   KNEE SURGERY     x2   TONSILLECTOMY     WISDOM TOOTH EXTRACTION      OB History     Gravida  3   Para  3   Term  2   Preterm  1   AB      Living  4      SAB      IAB      Ectopic      Multiple  1   Live Births  4            Home Medications    Prior to Admission medications   Medication Sig Start Date End Date Taking? Authorizing Provider  nitrofurantoin, macrocrystal-monohydrate, (MACROBID) 100 MG capsule Take 1 capsule  (100 mg total) by mouth 2 (two) times daily for 5 days. 08/10/20 08/15/20 Yes Shirlee Latch, PA-C    Family History Family History  Problem Relation Age of Onset   Diabetes Paternal Grandmother    Diabetes Paternal Grandfather    Thyroid disease Mother    Healthy Father    Cancer Neg Hx    Ovarian cancer Neg Hx    Breast cancer Neg Hx    Heart disease Neg Hx     Social History Social History   Tobacco Use   Smoking status: Former    Packs/day: 0.25    Types: Cigarettes    Quit date: 04/01/2019    Years since quitting: 1.3   Smokeless tobacco: Former  Building services engineer Use: Never used  Substance Use Topics   Alcohol use: Yes    Comment: rare   Drug use: No     Allergies   Tramadol, Ultram [tramadol hcl], and Other   Review of Systems Review of Systems  Constitutional:  Negative for chills, fatigue and fever.  Gastrointestinal:  Positive for abdominal pain. Negative for diarrhea, nausea and  vomiting.  Genitourinary:  Positive for dysuria, frequency and urgency. Negative for decreased urine volume, flank pain, hematuria, pelvic pain, vaginal bleeding, vaginal discharge and vaginal pain.  Musculoskeletal:  Positive for back pain.  Skin:  Negative for rash.  Neurological:  Negative for weakness and numbness.    Physical Exam Triage Vital Signs ED Triage Vitals  Enc Vitals Group     BP 08/10/20 1921 104/75     Pulse Rate 08/10/20 1921 (!) 103     Resp 08/10/20 1921 18     Temp 08/10/20 1921 98.9 F (37.2 C)     Temp Source 08/10/20 1921 Oral     SpO2 08/10/20 1921 100 %     Weight 08/10/20 1919 170 lb (77.1 kg)     Height 08/10/20 1919 5\' 7"  (1.702 m)     Head Circumference --      Peak Flow --      Pain Score 08/10/20 1919 5     Pain Loc --      Pain Edu? --      Excl. in GC? --    No data found.  Updated Vital Signs BP 104/75 (BP Location: Left Arm)   Pulse (!) 103   Temp 98.9 F (37.2 C) (Oral)   Resp 18   Ht 5\' 7"  (1.702 m)   Wt 170 lb (77.1  kg)   LMP 07/16/2020 (Approximate)   SpO2 100%   BMI 26.63 kg/m      Physical Exam Vitals and nursing note reviewed.  Constitutional:      General: She is not in acute distress.    Appearance: Normal appearance. She is not ill-appearing or toxic-appearing.  HENT:     Head: Normocephalic and atraumatic.  Eyes:     General: No scleral icterus.       Right eye: No discharge.        Left eye: No discharge.     Conjunctiva/sclera: Conjunctivae normal.  Cardiovascular:     Rate and Rhythm: Normal rate and regular rhythm.     Heart sounds: Normal heart sounds.  Pulmonary:     Effort: Pulmonary effort is normal. No respiratory distress.     Breath sounds: Normal breath sounds.  Abdominal:     Palpations: Abdomen is soft.     Tenderness: There is abdominal tenderness (mild suprapubic TTP). There is no right CVA tenderness or left CVA tenderness.  Musculoskeletal:     Cervical back: Neck supple.     Lumbar back: Tenderness (mild TTP left paralumbar muscles) present. Decreased range of motion.  Skin:    General: Skin is dry.  Neurological:     General: No focal deficit present.     Mental Status: She is alert. Mental status is at baseline.     Motor: No weakness.     Gait: Gait normal.  Psychiatric:        Mood and Affect: Mood normal.        Behavior: Behavior normal.        Thought Content: Thought content normal.     UC Treatments / Results  Labs (all labs ordered are listed, but only abnormal results are displayed) Labs Reviewed  POCT URINALYSIS DIP (DEVICE) - Abnormal; Notable for the following components:      Result Value   Hgb urine dipstick SMALL (*)    Protein, ur 30 (*)    Leukocytes,Ua MODERATE (*)    All other components within normal limits  URINE CULTURE  POCT URINALYSIS DIPSTICK, ED / UC    EKG   Radiology No results found.  Procedures Procedures (including critical care time)  Medications Ordered in UC Medications - No data to  display  Initial Impression / Assessment and Plan / UC Course  I have reviewed the triage vital signs and the nursing notes.  Pertinent labs & imaging results that were available during my care of the patient were reviewed by me and considered in my medical decision making (see chart for details).    25 year old female presenting for approximate 4-day history of dysuria, urinary frequency and urgency.  Also reports left-sided lower back pain over the past couple of days.  Vitals are normal and stable and she is overall well-appearing.  Exam significant for mild suprapubic tenderness to palpation and TTP of the left paralumbar muscles.  UA positive for small hemoglobin and moderate leukocytes.  We will send urine for culture and treat for UTI at this time with Macrobid.  No systemic symptoms and back pain is more likely musculoskeletal, likely related to her accidental fall a couple of days ago.  Supportive care encouraged increasing rest and fluids and Tylenol/Motrin as needed for back pain.  Advised patient we will call if we need to change the antibiotic.  ED precautions reviewed.   Final Clinical Impressions(s) / UC Diagnoses   Final diagnoses:  Acute cystitis with hematuria  Dysuria     Discharge Instructions      UTI: Based on either symptoms or urinalysis, you may have a urinary tract infection. We will send the urine for culture and call with results in a few days. Begin antibiotics at this time. Your symptoms should be much improved over the next 2-3 days. Increase rest and fluid intake. If for some reason symptoms are worsening or not improving after a couple of days or the urine culture determines the antibiotics you are taking will not treat the infection, the antibiotics may be changed. Return or go to ER for fever, back pain, worsening urinary pain, discharge, increased blood in urine. May take Tylenol or Motrin OTC for pain relief or consider AZO if no contraindications       ED Prescriptions     Medication Sig Dispense Auth. Provider   nitrofurantoin, macrocrystal-monohydrate, (MACROBID) 100 MG capsule Take 1 capsule (100 mg total) by mouth 2 (two) times daily for 5 days. 10 capsule Shirlee Latch, PA-C      PDMP not reviewed this encounter.   Shirlee Latch, PA-C 08/10/20 1958    Shirlee Latch, PA-C 08/10/20 2006

## 2020-08-10 NOTE — ED Triage Notes (Signed)
Pt c/o dysuria, urinary frequency, and lower abdominal pain. Started about 4 days ago. Denies lower back pain or fever.

## 2020-08-13 LAB — URINE CULTURE: Culture: 60000 — AB

## 2020-10-26 DIAGNOSIS — N760 Acute vaginitis: Secondary | ICD-10-CM | POA: Diagnosis not present

## 2020-10-26 DIAGNOSIS — F411 Generalized anxiety disorder: Secondary | ICD-10-CM | POA: Diagnosis not present

## 2020-10-26 DIAGNOSIS — E559 Vitamin D deficiency, unspecified: Secondary | ICD-10-CM | POA: Diagnosis not present

## 2020-10-26 DIAGNOSIS — R5383 Other fatigue: Secondary | ICD-10-CM | POA: Diagnosis not present

## 2020-10-26 DIAGNOSIS — D214 Benign neoplasm of connective and other soft tissue of abdomen: Secondary | ICD-10-CM | POA: Diagnosis not present

## 2020-10-26 DIAGNOSIS — Z1331 Encounter for screening for depression: Secondary | ICD-10-CM | POA: Diagnosis not present

## 2020-10-26 DIAGNOSIS — D519 Vitamin B12 deficiency anemia, unspecified: Secondary | ICD-10-CM | POA: Diagnosis not present

## 2020-12-07 DIAGNOSIS — J069 Acute upper respiratory infection, unspecified: Secondary | ICD-10-CM | POA: Diagnosis not present

## 2020-12-07 DIAGNOSIS — F411 Generalized anxiety disorder: Secondary | ICD-10-CM | POA: Diagnosis not present

## 2020-12-07 DIAGNOSIS — J111 Influenza due to unidentified influenza virus with other respiratory manifestations: Secondary | ICD-10-CM | POA: Diagnosis not present

## 2020-12-07 DIAGNOSIS — R0600 Dyspnea, unspecified: Secondary | ICD-10-CM | POA: Diagnosis not present

## 2020-12-07 DIAGNOSIS — F32A Depression, unspecified: Secondary | ICD-10-CM | POA: Diagnosis not present

## 2020-12-30 ENCOUNTER — Ambulatory Visit: Payer: Medicaid Other | Admitting: Obstetrics and Gynecology

## 2020-12-30 ENCOUNTER — Other Ambulatory Visit: Payer: Self-pay

## 2020-12-30 ENCOUNTER — Encounter: Payer: Self-pay | Admitting: Obstetrics and Gynecology

## 2020-12-30 VITALS — BP 122/83 | HR 102 | Ht 67.0 in | Wt 175.0 lb

## 2020-12-30 DIAGNOSIS — R399 Unspecified symptoms and signs involving the genitourinary system: Secondary | ICD-10-CM | POA: Diagnosis not present

## 2020-12-30 DIAGNOSIS — Z30011 Encounter for initial prescription of contraceptive pills: Secondary | ICD-10-CM | POA: Diagnosis not present

## 2020-12-30 DIAGNOSIS — Z3009 Encounter for other general counseling and advice on contraception: Secondary | ICD-10-CM | POA: Diagnosis not present

## 2020-12-30 DIAGNOSIS — R350 Frequency of micturition: Secondary | ICD-10-CM | POA: Diagnosis not present

## 2020-12-30 MED ORDER — LEVONORGEST-ETH ESTRAD 91-DAY 0.15-0.03 &0.01 MG PO TABS: 1.0000 | ORAL_TABLET | Freq: Every day | ORAL | 1 refills | Status: DC

## 2020-12-30 NOTE — Progress Notes (Signed)
HPI:      Colleen Lang is a 25 y.o. [phone] who LMP was Patient's last menstrual period was 12/20/2020 (exact date).  Subjective:   She presents today stating that she normally has cycles every month which were like "clockwork".  She is not taking OCPs and not using any other type of birth control at this time.  She is having unprotected sex regularly.  She says her partner takes testosterone and possibly has a female factor issue with fertility. In November she reports that her menstrual period was 2 weeks late and then in December she reports a very heavy menstrual periods with clots.  This menses has now mostly resolved to brown spotting. Additionally she complains of regular headaches and daily morning sickness which resolves as the day proceeds.  She has tried Nexium for this and it is somewhat helpful.    Hx: The following portions of the patient's history were reviewed and updated as appropriate:             She  has a past medical history of AR (allergic rhinitis), History of chlamydia infection (2014), Ovarian cyst, and Pelvic pain in female. She does not have any pertinent problems on file. She  has a past surgical history that includes Knee surgery; Wisdom tooth extraction; and Tonsillectomy. Her family history includes Diabetes in her paternal grandfather and paternal grandmother; Healthy in her father; Thyroid disease in her mother. She  reports that she quit smoking about 21 months ago. Her smoking use included cigarettes. She smoked an average of .25 packs per day. She has quit using smokeless tobacco. She reports current alcohol use. She reports that she does not use drugs. She has a current medication list which includes the following prescription(s): levonorgestrel-ethinyl estradiol. She is allergic to tramadol, ultram [tramadol hcl], and other.       Review of Systems:  Review of Systems  Constitutional: Denied constitutional symptoms, night sweats, recent illness,  fatigue, fever, insomnia and weight loss.  Eyes: Denied eye symptoms, eye pain, photophobia, vision change and visual disturbance.  Ears/Nose/Throat/Neck: Denied ear, nose, throat or neck symptoms, hearing loss, nasal discharge, sinus congestion and sore throat.  Cardiovascular: Denied cardiovascular symptoms, arrhythmia, chest pain/pressure, edema, exercise intolerance, orthopnea and palpitations.  Respiratory: Denied pulmonary symptoms, asthma, pleuritic pain, productive sputum, cough, dyspnea and wheezing.  Gastrointestinal: Denied, gastro-esophageal reflux, melena, nausea and vomiting.  Genitourinary: See HPI for additional information.  Musculoskeletal: Denied musculoskeletal symptoms, stiffness, swelling, muscle weakness and myalgia.  Dermatologic: Denied dermatology symptoms, rash and scar.  Neurologic: Denied neurology symptoms, dizziness, headache, neck pain and syncope.  Psychiatric: Denied psychiatric symptoms, anxiety and depression.  Endocrine: Denied endocrine symptoms including hot flashes and night sweats.   Meds:   No current outpatient medications on file prior to visit.   No current facility-administered medications on file prior to visit.      Objective:     Vitals:   12/30/20 1051  BP: 122/83  Pulse: (!) 102   Filed Weights   12/30/20 1051  Weight: 175 lb (79.4 kg)                        Assessment:    [id number] Patient Active Problem List   Diagnosis Date Noted   AFI (amniotic fluid index) increased 08/03/2018   Non-reassuring electronic fetal monitoring tracing 08/02/2018   Anemia of pregnancy in third trimester 08/02/2018   Prior fetal macrosomia, antepartum 07/16/2018   History  of twin pregnancy in prior pregnancy 12/21/2017   Sinus tachycardia 01/12/2015   Menometrorrhagia 08/19/2014   History of ovarian cyst 08/19/2014     1. Birth control counseling   2. Urinary symptom or sign   3. Frequent urination   4. Initiation of OCP (BCP)         Plan:            1.  Urine C&S for urinary tract symptoms because UA negative  2.  Patient would like to begin OCPs for cycle regulation as well as birth control.OCPs The risks /benefits of OCPs have been explained to the patient in detail.  Product literature has been given to her where appropriate.  I have instructed her in the use of OCPs.  I have explained to the patient that OCPs are not as effective for birth control during the first month of use, and that another form of contraception should be used during this time.  Both first-day start and Sunday start have been explained.  The risks and benefits of each was discussed.  She has been made aware of  the fact that in rare circumstances, other medications may affect the efficacy of OCPs.  I have answered all of her questions, and I believe that she has an understanding of the effectiveness and use of OCPs. She has chosen a 54-month birth control.  3.  Offered GI referral but patient declined at this time.  She would like to see how she feels on OCPs.  If her nausea continues she would consider a GI referral.  4.  She is due for an annual examination and Pap smear-plan this in approximately 3 months to coincide with an OCP follow-up visit.  Orders Orders Placed This Encounter  Procedures   Urine Culture     Meds ordered this encounter  Medications   Levonorgestrel-Ethinyl Estradiol (AMETHIA) 0.15-0.03 &0.01 MG tablet    Sig: Take 1 tablet by mouth at bedtime.    Dispense:  84 tablet    Refill:  1      F/U  Return in about 3 months (around 03/30/2021) for Annual Physical. I spent 24 minutes involved in the care of this patient preparing to see the patient by obtaining and reviewing her medical history (including labs, imaging tests and prior procedures), documenting clinical information in the electronic health record (EHR), counseling and coordinating care plans, writing and sending prescriptions, ordering tests or procedures  and in direct communicating with the patient and medical staff discussing pertinent items from her history and physical exam.  Elonda Husky, M.D. 12/30/2020 11:20 AM

## 2020-12-30 NOTE — Progress Notes (Signed)
Patient presents today with heavy periods with large clots and excess brown discharge. Patient states she has also been experiencing frequent urination and urgency without needing to void. Patient states she is not currently on any form of birth control and is open to the discussion of different types. Patient made aware her Pap smear is due and to schedule annual visit.

## 2021-01-03 LAB — URINE CULTURE

## 2021-01-04 ENCOUNTER — Encounter: Payer: Medicaid Other | Admitting: Obstetrics and Gynecology

## 2021-01-18 DIAGNOSIS — M545 Low back pain, unspecified: Secondary | ICD-10-CM | POA: Diagnosis not present

## 2021-02-01 DIAGNOSIS — Z6828 Body mass index (BMI) 28.0-28.9, adult: Secondary | ICD-10-CM | POA: Diagnosis not present

## 2021-02-01 DIAGNOSIS — M129 Arthropathy, unspecified: Secondary | ICD-10-CM | POA: Diagnosis not present

## 2021-02-01 DIAGNOSIS — R5383 Other fatigue: Secondary | ICD-10-CM | POA: Diagnosis not present

## 2021-02-01 DIAGNOSIS — E559 Vitamin D deficiency, unspecified: Secondary | ICD-10-CM | POA: Diagnosis not present

## 2021-02-01 DIAGNOSIS — Z0000 Encounter for general adult medical examination without abnormal findings: Secondary | ICD-10-CM | POA: Diagnosis not present

## 2021-02-15 DIAGNOSIS — M545 Low back pain, unspecified: Secondary | ICD-10-CM | POA: Diagnosis not present

## 2021-02-15 DIAGNOSIS — Z6827 Body mass index (BMI) 27.0-27.9, adult: Secondary | ICD-10-CM | POA: Diagnosis not present

## 2021-02-15 DIAGNOSIS — M329 Systemic lupus erythematosus, unspecified: Secondary | ICD-10-CM | POA: Diagnosis not present

## 2021-03-08 ENCOUNTER — Ambulatory Visit: Payer: Medicaid Other | Admitting: Obstetrics and Gynecology

## 2021-03-08 ENCOUNTER — Other Ambulatory Visit (HOSPITAL_COMMUNITY)
Admission: RE | Admit: 2021-03-08 | Discharge: 2021-03-08 | Disposition: A | Discharge status: Home / Self Care | Payer: Medicaid Other | Source: Ambulatory Visit | Attending: Obstetrics and Gynecology | Admitting: Obstetrics and Gynecology

## 2021-03-08 ENCOUNTER — Other Ambulatory Visit: Payer: Self-pay

## 2021-03-08 ENCOUNTER — Encounter: Payer: Self-pay | Admitting: Obstetrics and Gynecology

## 2021-03-08 VITALS — BP 113/80 | HR 103 | Ht 67.0 in | Wt 176.1 lb

## 2021-03-08 DIAGNOSIS — Z3009 Encounter for other general counseling and advice on contraception: Secondary | ICD-10-CM

## 2021-03-08 DIAGNOSIS — Z30011 Encounter for initial prescription of contraceptive pills: Secondary | ICD-10-CM

## 2021-03-08 DIAGNOSIS — Z01419 Encounter for gynecological examination (general) (routine) without abnormal findings: Secondary | ICD-10-CM | POA: Diagnosis not present

## 2021-03-08 DIAGNOSIS — Z124 Encounter for screening for malignant neoplasm of cervix: Secondary | ICD-10-CM | POA: Insufficient documentation

## 2021-03-08 MED ORDER — LEVONORGEST-ETH ESTRAD 91-DAY 0.15-0.03 &0.01 MG PO TABS: 1.0000 | ORAL_TABLET | Freq: Every day | ORAL | 2 refills | Status: DC

## 2021-03-08 NOTE — Progress Notes (Signed)
HPI:      [name] is a 26 y.o. [phone] who LMP was Patient's last menstrual period was 12/20/2020 (approximate).  Subjective:   She presents today for her annual examination.  She is in her first pack of 30-month pills.  She reports that she has had intermittent bleeding and passed something that looked like a large clot. (Picture looks like decidual cast) she continues to take pills as directed.    Hx: The following portions of the patient's history were reviewed and updated as appropriate:             She  has a past medical history of AR (allergic rhinitis), History of chlamydia infection (2014), Ovarian cyst, and Pelvic pain in female. She does not have any pertinent problems on file. She  has a past surgical history that includes Knee surgery; Wisdom tooth extraction; and Tonsillectomy. Her family history includes Diabetes in her paternal grandfather and paternal grandmother; Healthy in her father; Thyroid disease in her mother. She  reports that she quit smoking about 23 months ago. Her smoking use included cigarettes. She smoked an average of .25 packs per day. She has quit using smokeless tobacco. She reports current alcohol use. She reports that she does not use drugs. She has a current medication list which includes the following prescription(s): gabapentin, meloxicam, and levonorgestrel-ethinyl estradiol. She is allergic to tramadol, ultram [tramadol hcl], and other.       Review of Systems:  Review of Systems  Constitutional: Denied constitutional symptoms, night sweats, recent illness, fatigue, fever, insomnia and weight loss.  Eyes: Denied eye symptoms, eye pain, photophobia, vision change and visual disturbance.  Ears/Nose/Throat/Neck: Denied ear, nose, throat or neck symptoms, hearing loss, nasal discharge, sinus congestion and sore throat.  Cardiovascular: Denied cardiovascular symptoms, arrhythmia, chest pain/pressure, edema, exercise intolerance, orthopnea and  palpitations.  Respiratory: Denied pulmonary symptoms, asthma, pleuritic pain, productive sputum, cough, dyspnea and wheezing.  Gastrointestinal: Denied, gastro-esophageal reflux, melena, nausea and vomiting.  Genitourinary: Denied genitourinary symptoms including symptomatic vaginal discharge, pelvic relaxation issues, and urinary complaints.  Musculoskeletal: Denied musculoskeletal symptoms, stiffness, swelling, muscle weakness and myalgia.  Dermatologic: Denied dermatology symptoms, rash and scar.  Neurologic: Denied neurology symptoms, dizziness, headache, neck pain and syncope.  Psychiatric: Denied psychiatric symptoms, anxiety and depression.  Endocrine: Denied endocrine symptoms including hot flashes and night sweats.   Meds:   Current Outpatient Medications on File Prior to Visit  Medication Sig Dispense Refill   GABAPENTIN PO Take 200 mg by mouth daily.     MELOXICAM PO Take by mouth.     No current facility-administered medications on file prior to visit.     Objective:     Vitals:   03/08/21 0946  BP: 113/80  Pulse: (!) 103    Filed Weights   03/08/21 0946  Weight: 176 lb 1.6 oz (79.9 kg)              Physical examination General NAD, Conversant  HEENT Atraumatic; Op clear with mmm.  Normo-cephalic. Pupils reactive. Anicteric sclerae  Thyroid/Neck Smooth without nodularity or enlargement. Normal ROM.  Neck Supple.  Skin No rashes, lesions or ulceration. Normal palpated skin turgor. No nodularity.  Breasts: No masses or discharge.  Symmetric.  No axillary adenopathy.  Bilateral piercing  Lungs: Clear to auscultation.No rales or wheezes. Normal Respiratory effort, no retractions.  Heart: NSR.  No murmurs or rubs appreciated. No periferal edema  Abdomen: Soft.  Non-tender.  No masses.  No HSM.  No hernia  Extremities: Moves all appropriately.  Normal ROM for age. No lymphadenopathy.  Neuro: Oriented to PPT.  Normal mood. Normal affect.     Pelvic:   Vulva: Normal  appearance.  No lesions.  Vagina: No lesions or abnormalities noted.  Support: Normal pelvic support.  Urethra No masses tenderness or scarring.  Meatus Normal size without lesions or prolapse.  Cervix: Normal appearance.  No lesions.  Anus: Normal exam.  No lesions.  Perineum: Normal exam.  No lesions.        Bimanual   Uterus: Normal size.  Non-tender.  Mobile.  AV.  Adnexae: No masses.  Non-tender to palpation.  Cul-de-sac: Negative for abnormality.     Assessment:    [id number] Patient Active Problem List   Diagnosis Date Noted   AFI (amniotic fluid index) increased 08/03/2018   Non-reassuring electronic fetal monitoring tracing 08/02/2018   Anemia of pregnancy in third trimester 08/02/2018   Prior fetal macrosomia, antepartum 07/16/2018   History of twin pregnancy in prior pregnancy 12/21/2017   Sinus tachycardia 01/12/2015   Menometrorrhagia 08/19/2014   History of ovarian cyst 08/19/2014     1. Well woman exam with routine gynecological exam   2. Cervical cancer screening   3. Initiation of OCP (BCP)   4. Birth control counseling     Patient has had some breakthrough bleeding and passage of decidual cast during first pack of OCPs.  I am not entirely surprised by this based on her previous history of irregular bleeding in November and December.  My expectation is that once she gets into her second pack of pills her bleeding should become much more normal and regulated.   Plan:            1.  Basic Screening Recommendations The basic screening recommendations for asymptomatic women were discussed with the patient during her visit.  The age-appropriate recommendations were discussed with her and the rational for the tests reviewed.  When I am informed by the patient that another primary care physician has previously obtained the age-appropriate tests and they are up-to-date, only outstanding tests are ordered and referrals given as necessary.  Abnormal results of tests will  be discussed with her when all of her results are completed.  Routine preventative health maintenance measures emphasized: Exercise/Diet/Weight control, Tobacco Warnings, Alcohol/Substance use risks and Stress Management Pap performed 2.  Expectant management breakthrough bleeding and OCPs.  Patient to contact us if she continues to have irregular bleeding. Orders No orders of the defined types were placed in this encounter.    Meds ordered this encounter  Medications   Levonorgestrel-Ethinyl Estradiol (AMETHIA) 0.15-0.03 &0.01 MG tablet    Sig: Take 1 tablet by mouth at bedtime.    Dispense:  84 tablet    Refill:  2          F/U  Return in about 1 year (around 03/08/2022) for Annual Physical.  Elonda Husky, M.D. 03/08/2021 10:20 AM

## 2021-03-08 NOTE — Progress Notes (Signed)
Patients presents for annual exam today. Patient states irregular bleeding since starting birth control in December. She also reports recently passing a large "clot" while on her period that was very painful for her. Patient is due for pap smear, ordered. Patient states no other questions or concerns at this time.

## 2021-03-11 LAB — CYTOLOGY - PAP
Diagnosis: NEGATIVE
Diagnosis: REACTIVE

## 2021-04-18 DIAGNOSIS — M545 Low back pain, unspecified: Secondary | ICD-10-CM | POA: Diagnosis not present

## 2021-04-18 DIAGNOSIS — M329 Systemic lupus erythematosus, unspecified: Secondary | ICD-10-CM | POA: Diagnosis not present

## 2021-04-18 DIAGNOSIS — R030 Elevated blood-pressure reading, without diagnosis of hypertension: Secondary | ICD-10-CM | POA: Diagnosis not present

## 2021-04-18 DIAGNOSIS — Z6827 Body mass index (BMI) 27.0-27.9, adult: Secondary | ICD-10-CM | POA: Diagnosis not present

## 2021-04-18 DIAGNOSIS — E559 Vitamin D deficiency, unspecified: Secondary | ICD-10-CM | POA: Diagnosis not present

## 2021-05-17 DIAGNOSIS — R222 Localized swelling, mass and lump, trunk: Secondary | ICD-10-CM | POA: Diagnosis not present

## 2021-05-19 DIAGNOSIS — I471 Supraventricular tachycardia: Secondary | ICD-10-CM | POA: Diagnosis not present

## 2021-05-19 DIAGNOSIS — J309 Allergic rhinitis, unspecified: Secondary | ICD-10-CM | POA: Diagnosis not present

## 2021-05-19 DIAGNOSIS — F4310 Post-traumatic stress disorder, unspecified: Secondary | ICD-10-CM | POA: Diagnosis not present

## 2021-06-07 ENCOUNTER — Other Ambulatory Visit: Payer: Self-pay | Admitting: General Surgery

## 2021-06-07 DIAGNOSIS — D171 Benign lipomatous neoplasm of skin and subcutaneous tissue of trunk: Secondary | ICD-10-CM | POA: Diagnosis not present

## 2021-06-09 HISTORY — PX: CYST REMOVAL TRUNK: SHX6283

## 2021-06-30 DIAGNOSIS — J309 Allergic rhinitis, unspecified: Secondary | ICD-10-CM | POA: Diagnosis not present

## 2021-06-30 DIAGNOSIS — R5383 Other fatigue: Secondary | ICD-10-CM | POA: Diagnosis not present

## 2021-06-30 DIAGNOSIS — I471 Supraventricular tachycardia: Secondary | ICD-10-CM | POA: Diagnosis not present

## 2021-06-30 DIAGNOSIS — M2550 Pain in unspecified joint: Secondary | ICD-10-CM | POA: Diagnosis not present

## 2021-06-30 DIAGNOSIS — E663 Overweight: Secondary | ICD-10-CM | POA: Diagnosis not present

## 2021-06-30 DIAGNOSIS — F411 Generalized anxiety disorder: Secondary | ICD-10-CM | POA: Diagnosis not present

## 2021-10-11 ENCOUNTER — Ambulatory Visit
Admission: EM | Admit: 2021-10-11 | Discharge: 2021-10-11 | Disposition: A | Discharge status: Home / Self Care | Payer: Medicaid Other | Attending: Emergency Medicine | Admitting: Emergency Medicine

## 2021-10-11 DIAGNOSIS — S060X0A Concussion without loss of consciousness, initial encounter: Secondary | ICD-10-CM | POA: Insufficient documentation

## 2021-10-11 LAB — PREGNANCY, URINE: Preg Test, Ur: NEGATIVE

## 2021-10-11 MED ORDER — ONDANSETRON 8 MG PO TBDP: 8.0000 mg | ORAL_TABLET | Freq: Three times a day (TID) | ORAL | 0 refills | Status: DC | PRN

## 2021-10-11 NOTE — ED Triage Notes (Signed)
Pt states that she has taken 6 Excedrin migraines and still feels pain in her head and the base of her neck.   Pt states that she has 4 kids and is not trying for pregnancy.

## 2021-10-11 NOTE — ED Provider Notes (Signed)
[facility]    CSN: [id number] Arrival date & time: 10/11/21  1725      History   Chief Complaint Chief Complaint  Patient presents with   Possible Pregnancy   Head Injury    HPI Colleen Lang is a 26 y.o. female.   HPI  26 year old female here for evaluation of head injury.  Patient reports that she got married 3 days ago and was drinking.  Late into the evening she was leaning back in a lawn chair when it tipped over and she struck her head on a wooden deck rail and then the deck itself.  She denies any loss of consciousness.  She states that approximately 2 hours after her head injury she had some nausea and vomiting.  She states she slept all the next day and had some mildly blurry vision which is resolved.  She states that she has had continual nausea since then and she had some dry heaving this morning.  She denies any numbness, tingling, weakness in any of her extremities.  She states that she was 4 days late on her menstrual cycle and then had a short cycle so she is also concerned that she may be pregnant and is requesting a pregnancy test.  Patient has used over-the-counter Excedrin Migraine to help with her headache and pain at the base of her neck.  Past Medical History:  Diagnosis Date   AR (allergic rhinitis)    History of chlamydia infection 2014   Ovarian cyst    seen on u/s 6 months ago- per pt they were small   Pelvic pain in female     Patient Active Problem List   Diagnosis Date Noted   AFI (amniotic fluid index) increased 08/03/2018   Non-reassuring electronic fetal monitoring tracing 08/02/2018   Anemia of pregnancy in third trimester 08/02/2018   Prior fetal macrosomia, antepartum 07/16/2018   History of twin pregnancy in prior pregnancy 12/21/2017   Sinus tachycardia 01/12/2015   Menometrorrhagia 08/19/2014   History of ovarian cyst 08/19/2014    Past Surgical History:  Procedure Laterality Date   CYST REMOVAL TRUNK Left 06/2021    KNEE SURGERY     x2   TONSILLECTOMY     WISDOM TOOTH EXTRACTION      OB History     Gravida  3   Para  3   Term  2   Preterm  1   AB      Living  4      SAB      IAB      Ectopic      Multiple  1   Live Births  4            Home Medications    Prior to Admission medications   Medication Sig Start Date End Date Taking? Authorizing Provider  GABAPENTIN PO Take 200 mg by mouth daily.   Yes [provider]  MELOXICAM PO Take by mouth.   Yes [provider]  ondansetron (ZOFRAN-ODT) 8 MG disintegrating tablet Take 1 tablet (8 mg total) by mouth every 8 (eight) hours as needed for nausea or vomiting. 10/11/21  Yes Becky Augusta, NP    Family History Family History  Problem Relation Age of Onset   Diabetes Paternal Grandmother    Diabetes Paternal Grandfather    Thyroid disease Mother    Healthy Father    Cancer Neg Hx    Ovarian cancer Neg Hx  Breast cancer Neg Hx    Heart disease Neg Hx     Social History Social History   Tobacco Use   Smoking status: Former    Packs/day: 0.25    Types: Cigarettes    Quit date: 04/01/2019    Years since quitting: 2.5   Smokeless tobacco: Former  Building services engineer Use: Every day   Substances: Nicotine  Substance Use Topics   Alcohol use: Yes    Comment: rare   Drug use: No     Allergies   Tramadol, Ultram [tramadol hcl], and Other   Review of Systems Review of Systems  Eyes:  Positive for visual disturbance.  Gastrointestinal:  Positive for nausea and vomiting.  Neurological:  Positive for headaches. Negative for dizziness, syncope, weakness and numbness.     Physical Exam Triage Vital Signs ED Triage Vitals  Enc Vitals Group     BP      Pulse      Resp      Temp      Temp src      SpO2      Weight      Height      Head Circumference      Peak Flow      Pain Score      Pain Loc      Pain Edu?      Excl. in GC?    No data found.  Updated Vital Signs BP 120/88  (BP Location: Left Arm)   Pulse 77   Temp 98.2 F (36.8 C) (Oral)   Resp 20   Ht 5\' 6"  (1.676 m)   LMP 09/04/2021   SpO2 96%   BMI 28.42 kg/m   Visual Acuity Right Eye Distance:   Left Eye Distance:   Bilateral Distance:    Right Eye Near:   Left Eye Near:    Bilateral Near:     Physical Exam Vitals and nursing note reviewed.  Constitutional:      Appearance: Normal appearance. She is not ill-appearing.  HENT:     Head: Normocephalic.     Comments: Patient has a hematoma to her center occiput but no overlying ecchymosis, erythema, abrasion or laceration.    Right Ear: Tympanic membrane, ear canal and external ear normal. There is no impacted cerumen.     Left Ear: Tympanic membrane, ear canal and external ear normal. There is no impacted cerumen.  Eyes:     General: No scleral icterus.    Extraocular Movements: Extraocular movements intact.     Pupils: Pupils are equal, round, and reactive to light.  Cardiovascular:     Rate and Rhythm: Normal rate and regular rhythm.     Pulses: Normal pulses.     Heart sounds: Normal heart sounds. No murmur heard.    No friction rub. No gallop.  Pulmonary:     Effort: Pulmonary effort is normal.     Breath sounds: Normal breath sounds. No wheezing, rhonchi or rales.  Skin:    General: Skin is warm and dry.     Capillary Refill: Capillary refill takes less than 2 seconds.     Findings: No bruising or erythema.  Neurological:     General: No focal deficit present.     Mental Status: She is alert and oriented to person, place, and time.     Cranial Nerves: No cranial nerve deficit.     Sensory: No sensory deficit.     Motor:  No weakness.     Coordination: Coordination normal.     Gait: Gait normal.     Deep Tendon Reflexes: Reflexes normal.  Psychiatric:        Mood and Affect: Mood normal.        Behavior: Behavior normal.        Thought Content: Thought content normal.        Judgment: Judgment normal.      UC  Treatments / Results  Labs (all labs ordered are listed, but only abnormal results are displayed) Labs Reviewed  PREGNANCY, URINE    EKG   Radiology No results found.  Procedures Procedures (including critical care time)  Medications Ordered in UC Medications - No data to display  Initial Impression / Assessment and Plan / UC Course  I have reviewed the triage vital signs and the nursing notes.  Pertinent labs & imaging results that were available during my care of the patient were reviewed by me and considered in my medical decision making (see chart for details).   Patient is pleasant, nontoxic-appearing 26 year old female here for evaluation of a head injury after suffering a fall while sitting in the chair 3 days ago.  She has had continual headache, nausea, and has had some dry heaving.  She had blurry vision after the event, though she had been drinking, and that has resolved.  She does have light sensitivity.  On exam patient cranial nerves II through XII are intact.  Her pupils are equal and reactive and EOM is intact.  She does have a hematoma on the left side of her occiput but no overlying ecchymosis, erythema, abrasion, or laceration.  She has some musculoskeletal pain at the base of her neck but no midline spinous tenderness.  She is moving all extremities equally.  Bilateral grips and upper extremity strength are 5/5 and her bilateral lower extremity strength is 5/5.  DTRs are 2+ globally.  Patient exam is consistent with a concussion.  I have advised her to rest her brain and avoid electronics for the time being.  She can read print books for short intervals but mainly she is to have brain rest.  I will give her a prescription for Zofran that she can use as needed for nausea.  She can take Tylenol and Motrin for her headache.  If she develops a worsening headache, forceful vomiting, has return of blurry vision, changes in speech, or weakness that she should go to the ER for  evaluation.  Work note provided.   Final Clinical Impressions(s) / UC Diagnoses   Final diagnoses:  Concussion without loss of consciousness, initial encounter     Discharge Instructions      Rest your brain.  You need to avoid electronics as much as possible. Print reading is fine in short intervals.  Use the Zofran every 8 hours as needed for nausea.  You can take 3 OTC Ibuprofen with 2 extra strength Tylenol every 6 hours as needed for pain.  If you develop forceful vomiting, changes in vision, changes in speech, or weakness in your extremities you need to call 911 and go to the ER.      ED Prescriptions     Medication Sig Dispense Auth. Provider   ondansetron (ZOFRAN-ODT) 8 MG disintegrating tablet Take 1 tablet (8 mg total) by mouth every 8 (eight) hours as needed for nausea or vomiting. 20 tablet Becky Augusta, NP      PDMP not reviewed this encounter.   Alycia Rossetti,  Ysidro Evert, NP 10/11/21 1829

## 2021-10-11 NOTE — Discharge Instructions (Signed)
Rest your brain.  You need to avoid electronics as much as possible. Print reading is fine in short intervals.  Use the Zofran every 8 hours as needed for nausea.  You can take 3 OTC Ibuprofen with 2 extra strength Tylenol every 6 hours as needed for pain.  If you develop forceful vomiting, changes in vision, changes in speech, or weakness in your extremities you need to call 911 and go to the ER.

## 2021-10-11 NOTE — ED Triage Notes (Signed)
Pt c/o fall and hitting her head on Saturday.  Pt states that she got married on Saturday and was drinking. Pt was sitting on her front porch and leaned back and hit her head on a wooden bench.  Pt states that she was 4 days late for her menstrual and after hitting her head, her menstrual started. Pt states that her period has stopped early and asks for a pregnancy test.   Pt states that she vomited multiple times Saturday night and Sunday morning.   Pt states that she became extremely nauseas on Monday while at a restaurant.

## 2021-10-21 ENCOUNTER — Encounter: Payer: Self-pay | Admitting: Obstetrics and Gynecology

## 2021-10-28 DIAGNOSIS — E663 Overweight: Secondary | ICD-10-CM | POA: Diagnosis not present

## 2021-10-28 DIAGNOSIS — R5383 Other fatigue: Secondary | ICD-10-CM | POA: Diagnosis not present

## 2021-10-31 DIAGNOSIS — J302 Other seasonal allergic rhinitis: Secondary | ICD-10-CM | POA: Diagnosis not present

## 2021-10-31 DIAGNOSIS — F4310 Post-traumatic stress disorder, unspecified: Secondary | ICD-10-CM | POA: Diagnosis not present

## 2021-10-31 DIAGNOSIS — M542 Cervicalgia: Secondary | ICD-10-CM | POA: Diagnosis not present

## 2021-11-20 ENCOUNTER — Encounter: Payer: Self-pay | Admitting: Emergency Medicine

## 2021-11-20 ENCOUNTER — Ambulatory Visit
Admission: EM | Admit: 2021-11-20 | Discharge: 2021-11-20 | Disposition: A | Discharge status: Home / Self Care | Payer: Medicaid Other | Attending: Physician Assistant | Admitting: Physician Assistant

## 2021-11-20 DIAGNOSIS — R051 Acute cough: Secondary | ICD-10-CM | POA: Diagnosis not present

## 2021-11-20 DIAGNOSIS — J019 Acute sinusitis, unspecified: Secondary | ICD-10-CM

## 2021-11-20 MED ORDER — PROMETHAZINE-DM 6.25-15 MG/5ML PO SYRP: 5.0000 mL | ORAL_SOLUTION | Freq: Four times a day (QID) | ORAL | 0 refills | Status: DC | PRN

## 2021-11-20 MED ORDER — AMOXICILLIN-POT CLAVULANATE 875-125 MG PO TABS: 1.0000 | ORAL_TABLET | Freq: Two times a day (BID) | ORAL | 0 refills | Status: AC

## 2021-11-20 MED ORDER — IPRATROPIUM BROMIDE 0.06 % NA SOLN: 2.0000 | Freq: Four times a day (QID) | NASAL | 0 refills | Status: DC

## 2021-11-20 NOTE — ED Provider Notes (Signed)
[facility]    CSN: [id number] Arrival date & time: 11/20/21  1117      History   Chief Complaint Chief Complaint  Patient presents with   Cough   Sinus Problem    HPI Colleen Lang is a 26 y.o. female presenting for URI symptoms x3 weeks.  Patient says she had upper respiratory infection symptoms with cough and congestion for a week and a half and then says she thought she was feeling better until symptoms worsened over the past 1 week and now she is complaining of a lot of sinus pressure, green nasal drainage and headaches.  Denies any fever, breathing difficulty or wheezing.  Has been taking OTC medication without relief.  History of sinus infections and believes she has a sinus flexion.  HPI  Past Medical History:  Diagnosis Date   AR (allergic rhinitis)    History of chlamydia infection 2014   Ovarian cyst    seen on u/s 6 months ago- per pt they were small   Pelvic pain in female     Patient Active Problem List   Diagnosis Date Noted   AFI (amniotic fluid index) increased 08/03/2018   Non-reassuring electronic fetal monitoring tracing 08/02/2018   Anemia of pregnancy in third trimester 08/02/2018   Prior fetal macrosomia, antepartum 07/16/2018   History of twin pregnancy in prior pregnancy 12/21/2017   Sinus tachycardia 01/12/2015   Menometrorrhagia 08/19/2014   History of ovarian cyst 08/19/2014    Past Surgical History:  Procedure Laterality Date   CYST REMOVAL TRUNK Left 06/2021   KNEE SURGERY     x2   TONSILLECTOMY     WISDOM TOOTH EXTRACTION      OB History     Gravida  3   Para  3   Term  2   Preterm  1   AB      Living  4      SAB      IAB      Ectopic      Multiple  1   Live Births  4            Home Medications    Prior to Admission medications   Medication Sig Start Date End Date Taking? Authorizing Provider  amoxicillin-clavulanate (AUGMENTIN) 875-125 MG tablet Take 1 tablet by mouth every 12  (twelve) hours for 7 days. 11/20/21 11/27/21 Yes Eusebio Friendly B, PA-C  ipratropium (ATROVENT) 0.06 % nasal spray Place 2 sprays into both nostrils 4 (four) times daily. 11/20/21  Yes Shirlee Latch, PA-C  promethazine-dextromethorphan (PROMETHAZINE-DM) 6.25-15 MG/5ML syrup Take 5 mLs by mouth 4 (four) times daily as needed. 11/20/21  Yes Eusebio Friendly B, PA-C  GABAPENTIN PO Take 200 mg by mouth daily.    [provider]  MELOXICAM PO Take by mouth.    [provider]  ondansetron (ZOFRAN-ODT) 8 MG disintegrating tablet Take 1 tablet (8 mg total) by mouth every 8 (eight) hours as needed for nausea or vomiting. 10/11/21   Becky Augusta, NP    Family History Family History  Problem Relation Age of Onset   Diabetes Paternal Grandmother    Diabetes Paternal Grandfather    Thyroid disease Mother    Healthy Father    Cancer Neg Hx    Ovarian cancer Neg Hx    Breast cancer Neg Hx    Heart disease Neg Hx     Social History Social History   Tobacco Use  Smoking status: Former    Packs/day: 0.25    Types: Cigarettes    Quit date: 04/01/2019    Years since quitting: 2.6   Smokeless tobacco: Former  Building services engineer Use: Every day   Substances: Nicotine  Substance Use Topics   Alcohol use: Yes    Comment: rare   Drug use: No     Allergies   Tramadol, Ultram [tramadol hcl], and Other   Review of Systems Review of Systems  Constitutional:  Negative for chills, diaphoresis, fatigue and fever.  HENT:  Positive for congestion, rhinorrhea, sinus pressure and sinus pain. Negative for ear pain and sore throat.   Respiratory:  Negative for cough and shortness of breath.   Gastrointestinal:  Negative for abdominal pain, nausea and vomiting.  Musculoskeletal:  Negative for arthralgias and myalgias.  Skin:  Negative for rash.  Neurological:  Positive for headaches. Negative for weakness.  Hematological:  Negative for adenopathy.     Physical Exam Triage Vital  Signs ED Triage Vitals  Enc Vitals Group     BP 11/20/21 1136 118/87     Pulse Rate 11/20/21 1136 97     Resp 11/20/21 1136 15     Temp 11/20/21 1136 98 F (36.7 C)     Temp Source 11/20/21 1136 Oral     SpO2 11/20/21 1136 97 %     Weight 11/20/21 1134 170 lb (77.1 kg)     Height 11/20/21 1134 5\' 6"  (1.676 m)     Head Circumference --      Peak Flow --      Pain Score 11/20/21 1134 3     Pain Loc --      Pain Edu? --      Excl. in [place]? --    No data found.  Updated Vital Signs BP 118/87 (BP Location: Left Arm)   Pulse 97   Temp 98 F (36.7 C) (Oral)   Resp 15   Ht 5\' 6"  (1.676 m)   Wt 170 lb (77.1 kg)   LMP 11/06/2021 (Approximate)   SpO2 97%   BMI 27.44 kg/m     Physical Exam Vitals and nursing note reviewed.  Constitutional:      General: She is not in acute distress.    Appearance: Normal appearance. She is not ill-appearing or toxic-appearing.  HENT:     Head: Normocephalic and atraumatic.     Nose: Congestion present.     Right Sinus: Maxillary sinus tenderness present.     Left Sinus: Maxillary sinus tenderness present.     Mouth/Throat:     Mouth: Mucous membranes are moist.     Pharynx: Oropharynx is clear. Posterior oropharyngeal erythema present.  Eyes:     General: No scleral icterus.       Right eye: No discharge.        Left eye: No discharge.     Conjunctiva/sclera: Conjunctivae normal.  Cardiovascular:     Rate and Rhythm: Normal rate and regular rhythm.     Heart sounds: Normal heart sounds.  Pulmonary:     Effort: Pulmonary effort is normal. No respiratory distress.     Breath sounds: Normal breath sounds.  Musculoskeletal:     Cervical back: Neck supple.  Skin:    General: Skin is dry.  Neurological:     General: No focal deficit present.     Mental Status: She is alert. Mental status is at baseline.     Motor: No  weakness.     Gait: Gait normal.  Psychiatric:        Mood and Affect: Mood normal.        Behavior: Behavior normal.         Thought Content: Thought content normal.      UC Treatments / Results  Labs (all labs ordered are listed, but only abnormal results are displayed) Labs Reviewed - No data to display  EKG   Radiology No results found.  Procedures Procedures (including critical care time)  Medications Ordered in UC Medications - No data to display  Initial Impression / Assessment and Plan / UC Course  I have reviewed the triage vital signs and the nursing notes.  Pertinent labs & imaging results that were available during my care of the patient were reviewed by me and considered in my medical decision making (see chart for details).   26 year old female presenting for 3-week history of cough and congestion.  Over the past week she has started to experience headaches, sinus pain and greenish-yellow nasal drainage.  Vitals normal and stable patient is overall well-appearing.  On exam she does have nasal congestion without drainage.  Tenderness to maxillary sinuses.  Chest clear to auscultation heart regular rate and rhythm.  Suspect secondary bacterial sinusitis.  Treating this time with Augmentin, Promethazine DM for cough and Atrovent nasal spray.  Plenty rest and fluids.  Reviewed return and ER precautions.   Final Clinical Impressions(s) / UC Diagnoses   Final diagnoses:  Acute sinusitis, recurrence not specified, unspecified location  Acute cough   Discharge Instructions   None    ED Prescriptions     Medication Sig Dispense Auth. Provider   promethazine-dextromethorphan (PROMETHAZINE-DM) 6.25-15 MG/5ML syrup Take 5 mLs by mouth 4 (four) times daily as needed. 118 mL Eusebio Friendly B, PA-C   amoxicillin-clavulanate (AUGMENTIN) 875-125 MG tablet Take 1 tablet by mouth every 12 (twelve) hours for 7 days. 14 tablet Eusebio Friendly B, PA-C   ipratropium (ATROVENT) 0.06 % nasal spray Place 2 sprays into both nostrils 4 (four) times daily. 15 mL Shirlee Latch, PA-C      PDMP not  reviewed this encounter.   Shirlee Latch, PA-C 11/20/21 1207

## 2021-11-20 NOTE — ED Triage Notes (Signed)
Patient c/o cough and congestion for 3 weeks.  Patient states that she has started having HA and sinus pressure and congestion for a week.  Patient denies fevers.

## 2021-12-29 DIAGNOSIS — M542 Cervicalgia: Secondary | ICD-10-CM | POA: Diagnosis not present

## 2021-12-29 DIAGNOSIS — M546 Pain in thoracic spine: Secondary | ICD-10-CM | POA: Diagnosis not present

## 2022-01-23 ENCOUNTER — Ambulatory Visit
Admission: EM | Admit: 2022-01-23 | Discharge: 2022-01-23 | Disposition: A | Discharge status: Home / Self Care | Payer: Medicaid Other | Attending: Emergency Medicine | Admitting: Emergency Medicine

## 2022-01-23 ENCOUNTER — Encounter: Payer: Self-pay | Admitting: Emergency Medicine

## 2022-01-23 DIAGNOSIS — J069 Acute upper respiratory infection, unspecified: Secondary | ICD-10-CM

## 2022-01-23 MED ORDER — PROMETHAZINE-DM 6.25-15 MG/5ML PO SYRP: 5.0000 mL | ORAL_SOLUTION | Freq: Every evening | ORAL | 0 refills | Status: DC | PRN

## 2022-01-23 MED ORDER — AMOXICILLIN-POT CLAVULANATE 875-125 MG PO TABS
1.0000 | ORAL_TABLET | Freq: Two times a day (BID) | ORAL | 0 refills | Status: DC
Start: 2022-01-23 — End: 2022-01-31

## 2022-01-23 NOTE — ED Provider Notes (Signed)
[facility]    CSN: [id number] Arrival date & time: 01/23/22  1748      History   Chief Complaint Chief Complaint  Patient presents with   Nasal Congestion   Cough    HPI Colleen Lang is a 27 y.o. female.   Evaluation of fever, nasal congestion, rhinorrhea, sinus pain and pressure along the forehead, sore throat, cough and wheezing present for 21 days.  Fever has resolved.  Sore throat present only in the mornings.  Cough is productive with green sputum.  Associated diarrhea with last occurrence 4 days ago, stool described as watery.  Has been able to tolerate food and liquids.  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  Has attempted use of Tylenol, ibuprofen, nasal spray and saline irrigation with minimal relief.    Past Medical History:  Diagnosis Date   AR (allergic rhinitis)    History of chlamydia infection 2014   Ovarian cyst    seen on u/s 6 months ago- per pt they were small   Pelvic pain in female     Patient Active Problem List   Diagnosis Date Noted   AFI (amniotic fluid index) increased 08/03/2018   Non-reassuring electronic fetal monitoring tracing 08/02/2018   Anemia of pregnancy in third trimester 08/02/2018   Prior fetal macrosomia, antepartum 07/16/2018   History of twin pregnancy in prior pregnancy 12/21/2017   Sinus tachycardia 01/12/2015   Menometrorrhagia 08/19/2014   History of ovarian cyst 08/19/2014    Past Surgical History:  Procedure Laterality Date   CYST REMOVAL TRUNK Left 06/2021   KNEE SURGERY     x2   TONSILLECTOMY     WISDOM TOOTH EXTRACTION      OB History     Gravida  3   Para  3   Term  2   Preterm  1   AB      Living  4      SAB      IAB      Ectopic      Multiple  1   Live Births  4            Home Medications    Prior to Admission medications   Medication Sig Start Date End Date Taking? Authorizing Provider  GABAPENTIN PO Take 200 mg by mouth daily.    [provider]  ipratropium (ATROVENT) 0.06 % nasal spray Place 2 sprays into both nostrils 4 (four) times daily. 11/20/21   Eusebio Friendly B, PA-C  MELOXICAM PO Take by mouth.    [provider]  ondansetron (ZOFRAN-ODT) 8 MG disintegrating tablet Take 1 tablet (8 mg total) by mouth every 8 (eight) hours as needed for nausea or vomiting. 10/11/21   Becky Augusta, NP  promethazine-dextromethorphan (PROMETHAZINE-DM) 6.25-15 MG/5ML syrup Take 5 mLs by mouth 4 (four) times daily as needed. 11/20/21   Shirlee Latch, PA-C    Family History Family History  Problem Relation Age of Onset   Diabetes Paternal Grandmother    Diabetes Paternal Grandfather    Thyroid disease Mother    Healthy Father    Cancer Neg Hx    Ovarian cancer Neg Hx    Breast cancer Neg Hx    Heart disease Neg Hx     Social History Social History   Tobacco Use   Smoking status: Former    Packs/day: 0.25    Types: Cigarettes    Quit date: 04/01/2019  Years since quitting: 2.8   Smokeless tobacco: Former  Scientific laboratory technician Use: Every day   Substances: Nicotine  Substance Use Topics   Alcohol use: Yes    Comment: rare   Drug use: No     Allergies   Tramadol, Ultram [tramadol hcl], and Other   Review of Systems Review of Systems  Constitutional:  Positive for fever. Negative for activity change, appetite change, chills, diaphoresis, fatigue and unexpected weight change.  HENT:  Positive for congestion, rhinorrhea, sinus pressure, sinus pain and sore throat. Negative for dental problem, drooling, ear discharge, ear pain, facial swelling, hearing loss, mouth sores, nosebleeds, postnasal drip, sneezing, tinnitus, trouble swallowing and voice change.   Respiratory:  Positive for cough and wheezing. Negative for apnea, choking, chest tightness, shortness of breath and stridor.   Cardiovascular: Negative.   Gastrointestinal: Negative.      Physical Exam Triage Vital Signs ED Triage Vitals  Enc  Vitals Group     BP 01/23/22 1805 (!) 125/94     Pulse Rate 01/23/22 1805 88     Resp 01/23/22 1805 16     Temp 01/23/22 1805 98.3 F (36.8 C)     Temp Source 01/23/22 1805 Oral     SpO2 01/23/22 1805 99 %     Weight 01/23/22 1806 169 lb 15.6 oz (77.1 kg)     Height 01/23/22 1806 5\' 6"  (1.676 m)     Head Circumference --      Peak Flow --      Pain Score 01/23/22 1806 5     Pain Loc --      Pain Edu? --      Excl. in [place]? --    No data found.  Updated Vital Signs BP (!) 125/94 (BP Location: Left Arm)   Pulse 88   Temp 98.3 F (36.8 C) (Oral)   Resp 16   Ht 5\' 6"  (1.676 m)   Wt 169 lb 15.6 oz (77.1 kg)   LMP 01/12/2022 (Approximate)   SpO2 99%   BMI 27.43 kg/m   Visual Acuity Right Eye Distance:   Left Eye Distance:   Bilateral Distance:    Right Eye Near:   Left Eye Near:    Bilateral Near:     Physical Exam Constitutional:      Appearance: Normal appearance.  HENT:     Head: Normocephalic.     Right Ear: Tympanic membrane, ear canal and external ear normal.     Left Ear: Tympanic membrane, ear canal and external ear normal.     Nose: Congestion and rhinorrhea present.     Mouth/Throat:     Mouth: Mucous membranes are moist.     Pharynx: Posterior oropharyngeal erythema present.  Cardiovascular:     Rate and Rhythm: Normal rate and regular rhythm.     Pulses: Normal pulses.     Heart sounds: Normal heart sounds.  Pulmonary:     Effort: Pulmonary effort is normal.     Breath sounds: Normal breath sounds.  Neurological:     Mental Status: She is alert and oriented to person, place, and time. Mental status is at baseline.    UC Treatments / Results  Labs (all labs ordered are listed, but only abnormal results are displayed) Labs Reviewed - No data to display  EKG   Radiology No results found.  Procedures Procedures (including critical care time)  Medications Ordered in UC Medications - No data to display  Initial  Impression / Assessment and  Plan / UC Course  I have reviewed the triage vital signs and the nursing notes.  Pertinent labs & imaging results that were available during my care of the patient were reviewed by me and considered in my medical decision making (see chart for details).  Acute upper respiratory infection  Patient is in no signs of distress nor toxic appearing.  Vital signs are stable.  Low suspicion for pneumonia, pneumothorax or bronchitis and therefore will defer imaging.  Viral testing deferred as symptoms have been present for greater than 3 weeks.  Prescribed Augmentin and Promethazine DM for outpatient use.May use additional over-the-counter medications as needed for supportive care.  May follow-up with urgent care as needed if symptoms persist or worsen.  Note given.   Final Clinical Impressions(s) / UC Diagnoses   Final diagnoses:  None   Discharge Instructions   None    ED Prescriptions   None    PDMP not reviewed this encounter.   Hans Eden, NP 01/23/22 [phone]

## 2022-01-23 NOTE — Discharge Instructions (Signed)
Abs your symptoms have been present for 21 days we will provide an antibiotic as bacteria is most likely prolonging your illness  Begin Augmentin every morning and every evening for 10 days, ideally you will begin to see improvement in about 48 hours and steady progression from there  You may use cough syrup at bedtime as needed for comfort, be mindful this may make you drowsy, if it makes you too drowsy you may take A dose    You can take Tylenol and/or Ibuprofen as needed for fever reduction and pain relief.   For cough: honey 1/2 to 1 teaspoon (you can dilute the honey in water or another fluid).  You can also use guaifenesin and dextromethorphan for cough. You can use a humidifier for chest congestion and cough.  If you don't have a humidifier, you can sit in the bathroom with the hot shower running.      For sore throat: try warm salt water gargles, cepacol lozenges, throat spray, warm tea or water with lemon/honey, popsicles or ice, or OTC cold relief medicine for throat discomfort.   For congestion: take a daily anti-histamine like Zyrtec, Claritin, and a oral decongestant, such as pseudoephedrine.  You can also use Flonase 1-2 sprays in each nostril daily.   It is important to stay hydrated: drink plenty of fluids (water, gatorade/powerade/pedialyte, juices, or teas) to keep your throat moisturized and help further relieve irritation/discomfort.

## 2022-01-23 NOTE — ED Triage Notes (Signed)
Pt c/o cough, nasal congestion, facial pain, sore throat. Started about 3 weeks ago. Denies fever.

## 2022-01-31 ENCOUNTER — Ambulatory Visit
Admission: EM | Admit: 2022-01-31 | Discharge: 2022-01-31 | Disposition: A | Discharge status: Home / Self Care | Payer: Medicaid Other | Attending: Emergency Medicine | Admitting: Emergency Medicine

## 2022-01-31 DIAGNOSIS — U071 COVID-19: Secondary | ICD-10-CM

## 2022-01-31 LAB — RESP PANEL BY RT-PCR (RSV, FLU A&B, COVID)  RVPGX2
Influenza A by PCR: NEGATIVE
Influenza B by PCR: NEGATIVE
Resp Syncytial Virus by PCR: NEGATIVE
SARS Coronavirus 2 by RT PCR: POSITIVE — AB

## 2022-01-31 MED ORDER — PROMETHAZINE-DM 6.25-15 MG/5ML PO SYRP: 1.2500 mL | ORAL_SOLUTION | Freq: Four times a day (QID) | ORAL | 0 refills | Status: DC | PRN

## 2022-01-31 MED ORDER — IBUPROFEN 600 MG PO TABS: 600.0000 mg | ORAL_TABLET | Freq: Four times a day (QID) | ORAL | 0 refills | Status: DC | PRN

## 2022-01-31 MED ORDER — NIRMATRELVIR/RITONAVIR (PAXLOVID)TABLET: 3.0000 | ORAL_TABLET | Freq: Two times a day (BID) | ORAL | 0 refills | Status: AC

## 2022-01-31 NOTE — ED Provider Notes (Signed)
HPI  SUBJECTIVE:  Colleen Lang is a 27 y.o. female who presents with fevers tmax103, body aches, headaches, chills, nasal congestion, greenish rhinorrhea, postnasal drip, cough productive of the same material as her nasal congestion, nausea, abdominal pain starting yesterday.  Unable to sleep last night because of the cough.  No sore throat, loss sense or smell or taste, wheezing or shortness of breath, vomiting, diarrhea.  No known COVID or flu exposure, but she works in [employer].  She did not get the COVID or flu vaccines.  She states that she had a positive home COVID test.  She tried DayQuil with improvement in her symptoms. her last dose was within 6 hours of evaluation.  Symptoms worse with walking.  She has no past medical history.  No history of chronic kidney disease.  LMP: 2 weeks ago.  Denies possibility of being pregnant.  PCP: [facility].    Past Medical History:  Diagnosis Date   AR (allergic rhinitis)    History of chlamydia infection 2014   Ovarian cyst    seen on u/s 6 months ago- per pt they were small   Pelvic pain in female     Past Surgical History:  Procedure Laterality Date   CYST REMOVAL TRUNK Left 06/2021   KNEE SURGERY     x2   TONSILLECTOMY     WISDOM TOOTH EXTRACTION      Family History  Problem Relation Age of Onset   Diabetes Paternal Grandmother    Diabetes Paternal Grandfather    Thyroid disease Mother    Healthy Father    Cancer Neg Hx    Ovarian cancer Neg Hx    Breast cancer Neg Hx    Heart disease Neg Hx     Social History   Tobacco Use   Smoking status: Former    Packs/day: 0.25    Types: Cigarettes    Quit date: 04/01/2019    Years since quitting: 2.8   Smokeless tobacco: Former  Scientific laboratory technician Use: Every day   Substances: Nicotine  Substance Use Topics   Alcohol use: Yes    Comment: rare   Drug use: No    No current facility-administered medications for this encounter.  Current Outpatient Medications:     GABAPENTIN PO, Take 200 mg by mouth daily., Disp: , Rfl:    ibuprofen (ADVIL) 600 MG tablet, Take 1 tablet (600 mg total) by mouth every 6 (six) hours as needed., Disp: 30 tablet, Rfl: 0   ipratropium (ATROVENT) 0.06 % nasal spray, Place 2 sprays into both nostrils 4 (four) times daily., Disp: 15 mL, Rfl: 0   nirmatrelvir/ritonavir (PAXLOVID) 20 x 150 MG & 10 x 100MG  TABS, Take 3 tablets by mouth 2 (two) times daily for 5 days. Patient GFR is >60. Take nirmatrelvir (150 mg) two tablets twice daily for 5 days and ritonavir (100 mg) one tablet twice daily for 5 days., Disp: 30 tablet, Rfl: 0   promethazine-dextromethorphan (PROMETHAZINE-DM) 6.25-15 MG/5ML syrup, Take 1.3 mLs by mouth 4 (four) times daily as needed for cough. 1.25 -2.5 mL every 6 hours, Disp: 118 mL, Rfl: 0  Allergies  Allergen Reactions   Tramadol Anaphylaxis   Ultram [Tramadol Hcl] Anaphylaxis    Hard to breathe   Other Rash    honey     ROS  As noted in HPI.   Physical Exam  BP 120/82 (BP Location: Left Arm)   Pulse (!) 106   Temp 99.9 F (  37.7 C) (Oral)   Resp 16   Ht 5\' 7"  (1.702 m)   Wt 81.6 kg   LMP 01/12/2022 (Approximate)   SpO2 98%   BMI 28.19 kg/m   Constitutional: Well developed, well nourished, no acute distress Eyes:  EOMI, conjunctiva normal bilaterally HENT: Normocephalic, atraumatic,mucus membranes moist.  Positive nasal congestion. Neck: Positive cervical lymphadenopathy Respiratory: Normal inspiratory effort, lungs clear bilaterally Cardiovascular: Regular tachycardia, no murmurs GI: nondistended skin: No rash, skin intact Musculoskeletal: no deformities Neurologic: Alert & oriented x 3, no focal neuro deficits Psychiatric: Speech and behavior appropriate   ED Course   Medications - No data to display  Orders Placed This Encounter  Procedures   Resp panel by RT-PCR (RSV, Flu A&B, Covid) Anterior Nasal Swab    Standing Status:   Standing    Number of Occurrences:   1     Results for orders placed or performed during the hospital encounter of 01/31/22 (from the past 24 hour(s))  Resp panel by RT-PCR (RSV, Flu A&B, Covid) Anterior Nasal Swab     Status: Abnormal   Collection Time: 01/31/22 11:34 AM   Specimen: Anterior Nasal Swab  Result Value Ref Range   SARS Coronavirus 2 by RT PCR POSITIVE (A) NEGATIVE   Influenza A by PCR NEGATIVE NEGATIVE   Influenza B by PCR NEGATIVE NEGATIVE   Resp Syncytial Virus by PCR NEGATIVE NEGATIVE   No results found.  ED Clinical Impression  1. COVID-19 virus infection      ED Assessment/Plan      Patient presents with acute illness with systemic symptoms of tachycardia.  COVID-positive.  Home with Paxlovid as she is unvaccinated, saline nasal irrigation, Tylenol/ibuprofen, Promethazine DM, start Mucinex D, continue Atrovent.  Work note to go back Sunday.  Advised patient to wear mask for 5 days at all times after finishing quarantine.  With PCP as needed.  ER return precautions given  Patient denies history of chronic kidney disease.  She is otherwise healthy, has no other medical comorbidities.  Last GFR above 60.  Drug interactions reviewed.  There are no interactions that would require medication modification.  Discussed labs,  MDM, treatment plan, and plan for follow-up with patient. Discussed sn/sx that should prompt return to the ED. patient agrees with plan.   Meds ordered this encounter  Medications   nirmatrelvir/ritonavir (PAXLOVID) 20 x 150 MG & 10 x 100MG  TABS    Sig: Take 3 tablets by mouth 2 (two) times daily for 5 days. Patient GFR is >60. Take nirmatrelvir (150 mg) two tablets twice daily for 5 days and ritonavir (100 mg) one tablet twice daily for 5 days.    Dispense:  30 tablet    Refill:  0   promethazine-dextromethorphan (PROMETHAZINE-DM) 6.25-15 MG/5ML syrup    Sig: Take 1.3 mLs by mouth 4 (four) times daily as needed for cough. 1.25 -2.5 mL every 6 hours    Dispense:  118 mL    Refill:   0   ibuprofen (ADVIL) 600 MG tablet    Sig: Take 1 tablet (600 mg total) by mouth every 6 (six) hours as needed.    Dispense:  30 tablet    Refill:  0      *This clinic note was created using [employer]. Therefore, there may be occasional mistakes despite careful proofreading.  ?    Melynda Ripple, MD 02/01/22 1506

## 2022-01-31 NOTE — ED Triage Notes (Addendum)
Pt c/o fever,bodyaches,cough & HA x2 days. Took home test this AM which resulted +

## 2022-01-31 NOTE — Discharge Instructions (Signed)
Finish the [employer].  Saline nasal irrigation with a [employer]Med sinus rinse and distilled water as often as you want, 1000 mg tylenol and 600 mg together 3 times a day as needed for body aches, headaches, fevers, Promethazine DM cough, start Mucinex D, continue Atrovent nasal spray.  Wear mask for 5 days at all times after finishing quarantine.

## 2022-03-28 DIAGNOSIS — H5213 Myopia, bilateral: Secondary | ICD-10-CM | POA: Diagnosis not present

## 2022-06-02 DIAGNOSIS — M25532 Pain in left wrist: Secondary | ICD-10-CM | POA: Diagnosis not present

## 2022-06-02 DIAGNOSIS — S63502A Unspecified sprain of left wrist, initial encounter: Secondary | ICD-10-CM | POA: Diagnosis not present

## 2022-07-05 DIAGNOSIS — N898 Other specified noninflammatory disorders of vagina: Secondary | ICD-10-CM | POA: Diagnosis not present

## 2022-07-05 DIAGNOSIS — Z3202 Encounter for pregnancy test, result negative: Secondary | ICD-10-CM | POA: Diagnosis not present

## 2022-07-05 DIAGNOSIS — R350 Frequency of micturition: Secondary | ICD-10-CM | POA: Diagnosis not present

## 2022-07-05 DIAGNOSIS — Z7251 High risk heterosexual behavior: Secondary | ICD-10-CM | POA: Diagnosis not present

## 2022-07-05 DIAGNOSIS — N760 Acute vaginitis: Secondary | ICD-10-CM | POA: Diagnosis not present

## 2022-07-19 DIAGNOSIS — S92515A Nondisplaced fracture of proximal phalanx of left lesser toe(s), initial encounter for closed fracture: Secondary | ICD-10-CM | POA: Diagnosis not present

## 2022-07-19 DIAGNOSIS — M79672 Pain in left foot: Secondary | ICD-10-CM | POA: Diagnosis not present

## 2022-08-09 ENCOUNTER — Telehealth: Payer: Self-pay | Admitting: General Practice

## 2022-08-09 NOTE — Telephone Encounter (Signed)
Patient called in stating that she broke her toe over 3 weeks ago and was seen at urgent care for it. They advised her if not getting better to go back to UC or see her PCP. Her husband accidentally stepped on the same toe 3 nights ago and now it is black and blue again, swollen to the point that she cannot move the two toes beside of it, and hurting. Should she come in to be evaluated or go to UC again, or what should she do? Please advise.

## 2022-09-14 ENCOUNTER — Encounter: Payer: Self-pay | Admitting: Obstetrics and Gynecology

## 2022-09-14 ENCOUNTER — Ambulatory Visit (INDEPENDENT_AMBULATORY_CARE_PROVIDER_SITE_OTHER): Payer: Medicaid Other | Admitting: Obstetrics and Gynecology

## 2022-09-14 ENCOUNTER — Telehealth: Payer: Self-pay | Admitting: Obstetrics and Gynecology

## 2022-09-14 ENCOUNTER — Other Ambulatory Visit (HOSPITAL_COMMUNITY)
Admission: RE | Admit: 2022-09-14 | Discharge: 2022-09-14 | Disposition: A | Discharge status: Home / Self Care | Payer: Medicaid Other | Source: Ambulatory Visit | Attending: Obstetrics and Gynecology | Admitting: Obstetrics and Gynecology

## 2022-09-14 VITALS — Resp 16 | Ht 67.0 in | Wt 185.4 lb

## 2022-09-14 DIAGNOSIS — N949 Unspecified condition associated with female genital organs and menstrual cycle: Secondary | ICD-10-CM

## 2022-09-14 DIAGNOSIS — Z01419 Encounter for gynecological examination (general) (routine) without abnormal findings: Secondary | ICD-10-CM | POA: Insufficient documentation

## 2022-09-14 DIAGNOSIS — R7303 Prediabetes: Secondary | ICD-10-CM

## 2022-09-14 DIAGNOSIS — E663 Overweight: Secondary | ICD-10-CM

## 2022-09-14 NOTE — Telephone Encounter (Signed)
The patient contacted the office scheduled for today at 3:15 pm with Dr. Valentino Saxon.

## 2022-09-14 NOTE — Telephone Encounter (Signed)
The patient contacted our office for scheduling Annual exam. She has seen Dr. Logan Bores in the past so I offer first opening with him on Friday, 09/15/22 at 9:15 am. The patient was requesting to see Dr Valentino Saxon. Per Dr. Valentino Saxon, she would be willing to see this patient this afternoon for annual exam. I contacted the patient via phone.

## 2022-09-14 NOTE — Progress Notes (Signed)
GYNECOLOGY ANNUAL PHYSICAL EXAM PROGRESS NOTE  Subjective:    Colleen Lang is a 27 y.o. [id number] female who presents for an annual exam. The patient is sexually active. The patient participates in regular exercise: yes. Has the patient ever been transfused or tattooed?: yes. The patient reports that there is not domestic violence in her life.   The patient has the following complaints today:  Patient notes a bump in her vagina that has been there for almost a week. Notes that it feels firm, along the left side of her vagina. Reports her husband was the one to first notice it. Is sometimes uncomfortable.   Menstrual History: Menarche age: 72 Patient's last menstrual period was 08/22/2022. Period Cycle (Days): 28 Period Duration (Days): 4-6 Period Pattern: Regular Menstrual Flow: Moderate, Heavy, Light Menstrual Control: Panty liner, Tampon Menstrual Control Change Freq (Hours): 2-4 Dysmenorrhea: None   Gynecologic History:  Contraception: coitus interruptus. History of STI's: Denies Last Pap: 03/08/2021. Results were: normal. Denies h/o abnormal pap smears. Last mammogram: Not age appropriate      Upstream - 09/14/22 1506       Pregnancy Intention Screening   Does the patient want to become pregnant in the next year? No    Does the patient's partner want to become pregnant in the next year? No    Would the patient like to discuss contraceptive options today? No      Contraception Wrap Up   Current Method Emergency Contraception;No Contraceptive Precautions            The pregnancy intention screening data noted above was reviewed. Potential methods of contraception were discussed. The patient elected to proceed with Withdrawal or Other Method.    OB History  Gravida Para Term Preterm AB Living  3 3 2 1  0 4  SAB IAB Ectopic Multiple Live Births  0 0 0 1 4    # Outcome Date GA Lbr Len/2nd Weight Sex Type Anes PTL Lv  3 Term 08/03/18 [redacted]w[redacted]d  8 lb 14.9 oz  (4.05 kg) M Vag-Spont EPI  LIV     Birth Comments: no anomalies noted at delivery     Name: Romer,BOY Chyla     Apgar1: 6  Apgar5: 9  2A Preterm 04/09/15 [redacted]w[redacted]d / 00:22 5 lb 5.2 oz (2.415 kg) F Vag-Spont None  LIV     Name: Diviney,GIRLA Jaleah     Apgar1: 9  Apgar5: 9  2B Preterm 04/09/15 106w2d / 00:30 6 lb 7.4 oz (2.93 kg) F Vag-Spont None  LIV     Name: Vandenbrink,GIRLB Nikyla     Apgar1: 9  Apgar5: 9  1 Term 2014   9 lb 1.6 oz (4.128 kg) M Vag-Spont   LIV    Past Medical History:  Diagnosis Date   AR (allergic rhinitis)    History of chlamydia infection 2014   Ovarian cyst    seen on u/s 6 months ago- per pt they were small   Pelvic pain in female     Past Surgical History:  Procedure Laterality Date   CYST REMOVAL TRUNK Left 06/2021   KNEE SURGERY     x2   TONSILLECTOMY     WISDOM TOOTH EXTRACTION      Family History  Problem Relation Age of Onset   Diabetes Paternal Grandmother    Diabetes Paternal Grandfather    Thyroid disease Mother    Healthy Father    Cancer Neg Hx    Ovarian cancer  Neg Hx    Breast cancer Neg Hx    Heart disease Neg Hx     Social History   Socioeconomic History   Marital status: Married    Spouse name: Not on file   Number of children: Not on file   Years of education: Not on file   Highest education level: Not on file  Occupational History   Not on file  Tobacco Use   Smoking status: Former    Current packs/day: 0.00    Types: Cigarettes    Quit date: 04/01/2019    Years since quitting: 3.4   Smokeless tobacco: Former  Building services engineer status: Every Day   Substances: Nicotine  Substance and Sexual Activity   Alcohol use: Yes    Comment: rare   Drug use: No   Sexual activity: Yes    Birth control/protection: Pill  Other Topics Concern   Not on file  Social History Narrative   Not on file   Social Determinants of Health   Financial Resource Strain: Not on file  Food Insecurity: Not on file  Transportation Needs: Not  on file  Physical Activity: Not on file  Stress: Not on file  Social Connections: Not on file  Intimate Partner Violence: Not on file    Current Outpatient Medications on File Prior to Visit  Medication Sig Dispense Refill   ibuprofen (ADVIL) 600 MG tablet Take 1 tablet (600 mg total) by mouth every 6 (six) hours as needed. 30 tablet 0   No current facility-administered medications on file prior to visit.    Allergies  Allergen Reactions   Tramadol Anaphylaxis   Ultram [Tramadol Hcl] Anaphylaxis    Hard to breathe   Other Rash    honey     Review of Systems Constitutional: negative for chills, fatigue, fevers and sweats Eyes: negative for irritation, redness and visual disturbance Ears, nose, mouth, throat, and face: negative for hearing loss, nasal congestion, snoring and tinnitus Respiratory: negative for asthma, cough, sputum Cardiovascular: negative for chest pain, dyspnea, exertional chest pressure/discomfort, irregular heart beat, palpitations and syncope Gastrointestinal: negative for abdominal pain, change in bowel habits, nausea and vomiting Genitourinary: negative for abnormal menstrual periods, sexual problems and vaginal discharge, dysuria and urinary incontinence. Positive for genital lesions (see HPI).  Integument/breast: negative for breast lump, breast tenderness and nipple discharge Hematologic/lymphatic: negative for bleeding and easy bruising Musculoskeletal:negative for back pain and muscle weakness Neurological: negative for dizziness, headaches, vertigo and weakness Endocrine: negative for diabetic symptoms including polydipsia, polyuria and skin dryness Allergic/Immunologic: negative for hay fever and urticaria      Objective:  Resp. rate 16, height 5\' 7"  (1.702 m), weight 185 lb 6.4 oz (84.1 kg), last menstrual period 08/22/2022. Body mass index is 29.04 kg/m.   General Appearance:    Alert, cooperative, no distress, appears stated age  Head:     Normocephalic, without obvious abnormality, atraumatic  Eyes:    PERRL, conjunctiva/corneas clear, EOM's intact, both eyes  Ears:    Normal external ear canals, both ears  Nose:   Nares normal, septum midline, mucosa normal, no drainage or sinus tenderness  Throat:   Lips, mucosa, and tongue normal; teeth and gums normal  Neck:   Supple, symmetrical, trachea midline, no adenopathy; thyroid: no enlargement/tenderness/nodules; no carotid bruit or JVD  Back:     Symmetric, no curvature, ROM normal, no CVA tenderness  Lungs:     Clear to auscultation bilaterally, respirations unlabored  Chest  Wall:    No tenderness or deformity   Heart:    Regular rate and rhythm, S1 and S2 normal, no murmur, rub or gallop  Breast Exam:    No tenderness, masses, or nipple abnormality  Abdomen:     Soft, non-tender, bowel sounds active all four quadrants, no masses, no organomegaly.    Genitalia:    Pelvic:external genitalia normal, vagina without lesions, scant thin white discharge, no tenderness, rectovaginal septum  normal. Cervix normal in appearance, no cervical motion tenderness, slightly tilted to the left. No adnexal masses or tenderness.  Uterus normal size, shape, mobile, regular contours, nontender.  Rectal:    Normal external sphincter.  No hemorrhoids appreciated. Internal exam not done.   Extremities:   Extremities normal, atraumatic, no cyanosis or edema  Pulses:   2+ and symmetric all extremities  Skin:   Skin color, texture, turgor normal, no rashes or lesions  Lymph nodes:   Cervical, supraclavicular, and axillary nodes normal  Neurologic:   CNII-XII intact, normal strength, sensation and reflexes throughout     Labs:  Performed by PCP ~ 6 months ago.    Assessment:   1. Well woman exam with routine gynecological exam   2. Prediabetes   3. Overweight (BMI 25.0-29.9)      Plan:  - Blood tests: None ordered. Up to date by PCP. - Breast self exam technique reviewed and patient encouraged  to perform self-exam monthly. - Contraception: coitus interruptus. - Discussed healthy lifestyle modifications. - Mammogram  : Not age appropriate - Pap smear  UTD . - No vaginal mass palpable today. Patient also reports occasional postcoital bleeding on further discussion. Cervix was notably deviated more to left, is possibly the bump felt. Advised on performing vaginitis screen to rule out any other causes. Performed today.  - Prediabetes, notes she has f/u to repeat labs in 1 month with PCP.  - Follow up in 1 year for annual exam   Hildred Laser, MD [facility]

## 2022-09-15 ENCOUNTER — Ambulatory Visit: Payer: Medicaid Other | Admitting: Obstetrics and Gynecology

## 2022-09-15 DIAGNOSIS — Z01419 Encounter for gynecological examination (general) (routine) without abnormal findings: Secondary | ICD-10-CM

## 2022-09-18 ENCOUNTER — Other Ambulatory Visit: Payer: Self-pay | Admitting: Obstetrics and Gynecology

## 2022-09-18 LAB — CERVICOVAGINAL ANCILLARY ONLY
Bacterial Vaginitis (gardnerella): POSITIVE — AB
Candida Glabrata: NEGATIVE
Candida Vaginitis: NEGATIVE
Chlamydia: NEGATIVE
Comment: NEGATIVE
Comment: NEGATIVE
Comment: NEGATIVE
Comment: NEGATIVE
Comment: NEGATIVE
Comment: NORMAL
Neisseria Gonorrhea: NEGATIVE
Trichomonas: NEGATIVE

## 2022-09-18 MED ORDER — METRONIDAZOLE 500 MG PO TABS
500.0000 mg | ORAL_TABLET | Freq: Two times a day (BID) | ORAL | 0 refills | Status: DC
Start: 2022-09-18 — End: 2022-11-01

## 2022-11-01 ENCOUNTER — Ambulatory Visit: Payer: Medicaid Other | Admitting: Cardiology

## 2022-11-01 ENCOUNTER — Encounter: Payer: Self-pay | Admitting: Cardiology

## 2022-11-01 VITALS — BP 110/90 | HR 93 | Ht 67.0 in | Wt 185.4 lb

## 2022-11-01 DIAGNOSIS — Z13220 Encounter for screening for lipoid disorders: Secondary | ICD-10-CM | POA: Diagnosis not present

## 2022-11-01 DIAGNOSIS — Z1329 Encounter for screening for other suspected endocrine disorder: Secondary | ICD-10-CM

## 2022-11-01 DIAGNOSIS — Z131 Encounter for screening for diabetes mellitus: Secondary | ICD-10-CM

## 2022-11-01 DIAGNOSIS — Z0000 Encounter for general adult medical examination without abnormal findings: Secondary | ICD-10-CM

## 2022-11-01 DIAGNOSIS — I10 Essential (primary) hypertension: Secondary | ICD-10-CM

## 2022-11-01 NOTE — Progress Notes (Signed)
Complete physical exam  Patient: Colleen Lang   DOB: 1995-04-26   27 y.o. Female  MRN: [id number]  Subjective:    Chief Complaint  Patient presents with   Annual Exam    Annual     Colleen Lang is a 27 y.o. female who presents today for a complete physical exam. She reports consuming a general diet. Gym/ health club routine includes cardio and light weights. She generally feels well. She reports sleeping well. She does not have additional problems to discuss today.    Most recent fall risk assessment:    09/14/2022    3:05 PM  Fall Risk   Falls in the past year? 0  Number falls in past yr: 0  Injury with Fall? 0  Risk for fall due to : No Fall Risks  Follow up Falls evaluation completed     Most recent depression screenings:    09/14/2022    3:05 PM 08/12/2015   12:45 PM  PHQ 2/9 Scores  PHQ - 2 Score 0 2  PHQ- 9 Score  6    Vision:Within last year and Dental: No current dental problems and Receives regular dental care  Past Medical History:  Diagnosis Date   AR (allergic rhinitis)    History of chlamydia infection 2014   Ovarian cyst    seen on u/s 6 months ago- per pt they were small   Pelvic pain in female     Past Surgical History:  Procedure Laterality Date   CYST REMOVAL TRUNK Left 06/2021   KNEE SURGERY     x2   TONSILLECTOMY     WISDOM TOOTH EXTRACTION      Family History  Problem Relation Age of Onset   Diabetes Paternal Grandmother    Diabetes Paternal Grandfather    Thyroid disease Mother    Healthy Father    Cancer Neg Hx    Ovarian cancer Neg Hx    Breast cancer Neg Hx    Heart disease Neg Hx     Social History   Socioeconomic History   Marital status: Married    Spouse name: Not on file   Number of children: Not on file   Years of education: Not on file   Highest education level: Not on file  Occupational History   Not on file  Tobacco Use   Smoking status: Former    Current packs/day: 0.00    Types: Cigarettes    Quit  date: 04/01/2019    Years since quitting: 3.5   Smokeless tobacco: Former  Building services engineer status: Every Day   Substances: Nicotine  Substance and Sexual Activity   Alcohol use: Yes    Comment: rare   Drug use: No   Sexual activity: Yes    Birth control/protection: Pill  Other Topics Concern   Not on file  Social History Narrative   Not on file   Social Determinants of Health   Financial Resource Strain: Not on file  Food Insecurity: Not on file  Transportation Needs: Not on file  Physical Activity: Not on file  Stress: Not on file  Social Connections: Not on file  Intimate Partner Violence: Not on file    Outpatient Medications Prior to Visit  Medication Sig   [DISCONTINUED] ibuprofen (ADVIL) 600 MG tablet Take 1 tablet (600 mg total) by mouth every 6 (six) hours as needed. (Patient not taking: Reported on 11/01/2022)   [DISCONTINUED] metroNIDAZOLE (FLAGYL) 500 MG tablet Take  1 tablet (500 mg total) by mouth 2 (two) times daily. (Patient not taking: Reported on 11/01/2022)   No facility-administered medications prior to visit.    Review of Systems  Constitutional: Negative.   HENT: Negative.    Eyes: Negative.   Respiratory: Negative.  Negative for shortness of breath.   Cardiovascular: Negative.  Negative for chest pain.  Gastrointestinal: Negative.  Negative for abdominal pain, constipation and diarrhea.  Genitourinary: Negative.   Musculoskeletal:  Negative for joint pain and myalgias.  Skin: Negative.   Neurological: Negative.  Negative for dizziness and headaches.  Endo/Heme/Allergies: Negative.   All other systems reviewed and are negative.       Objective:     BP (!) 110/90   Pulse 93   Ht 5\' 7"  (1.702 m)   Wt 185 lb 6.4 oz (84.1 kg)   SpO2 99%   BMI 29.04 kg/m  BP Readings from Last 3 Encounters:  11/01/22 (!) 110/90  01/31/22 120/82  01/23/22 (!) 125/94      Physical Exam Vitals and nursing note reviewed.  Constitutional:       Appearance: Normal appearance. She is normal weight.  HENT:     Head: Normocephalic and atraumatic.     Nose: Nose normal.     Mouth/Throat:     Mouth: Mucous membranes are moist.  Eyes:     Extraocular Movements: Extraocular movements intact.     Conjunctiva/sclera: Conjunctivae normal.     Pupils: Pupils are equal, round, and reactive to light.  Cardiovascular:     Rate and Rhythm: Normal rate and regular rhythm.     Pulses: Normal pulses.     Heart sounds: Normal heart sounds.  Pulmonary:     Effort: Pulmonary effort is normal.     Breath sounds: Normal breath sounds.  Abdominal:     General: Abdomen is flat. Bowel sounds are normal.     Palpations: Abdomen is soft.  Musculoskeletal:        General: Normal range of motion.     Cervical back: Normal range of motion.  Skin:    General: Skin is warm and dry.  Neurological:     General: No focal deficit present.     Mental Status: She is alert and oriented to person, place, and time.  Psychiatric:        Mood and Affect: Mood normal.        Behavior: Behavior normal.        Thought Content: Thought content normal.        Judgment: Judgment normal.      No results found for any visits on 11/01/22.  Recent Results (from the past 2160 hour(s))  Cervicovaginal ancillary only     Status: Abnormal   Collection Time: 09/14/22  3:54 PM  Result Value Ref Range   Neisseria Gonorrhea Negative    Chlamydia Negative    Trichomonas Negative    Bacterial Vaginitis (gardnerella) Positive (A)    Candida Vaginitis Negative    Candida Glabrata Negative    Comment Normal Reference Range Candida Species - Negative    Comment Normal Reference Range Candida Galbrata - Negative    Comment Normal Reference Range Trichomonas - Negative    Comment Normal Reference Ranger Chlamydia - Negative    Comment      Normal Reference Range Neisseria Gonorrhea - Negative   Comment      Normal Reference Range Bacterial Vaginosis - Negative  Assessment & Plan:    Routine Health Maintenance and Physical Exam  Immunization History  Administered Date(s) Administered   Tdap 02/09/2015, 04/17/2018    Health Maintenance  Topic Date Due   Hepatitis C Screening  Never done   INFLUENZA VACCINE  Never done   COVID-19 Vaccine (1 - 2023-24 season) Never done   Cervical Cancer Screening (Pap smear)  03/08/2024   DTaP/Tdap/Td (3 - Td or Tdap) 04/16/2028   HIV Screening  Completed   HPV VACCINES  Aged Out    Discussed health benefits of physical activity, and encouraged her to engage in regular exercise appropriate for her age and condition.  Problem List Items Addressed This Visit       Other   Encounter for annual health examination - Primary   Relevant Orders   CMP14+EGFR   CBC with Differential/Platelet   Other Visit Diagnoses     Diabetes mellitus screening       Relevant Orders   Hemoglobin A1c   Thyroid disorder screening       Relevant Orders   CMP14+EGFR   TSH   Lipid screening       Relevant Orders   CMP14+EGFR   Lipid Profile      Return in about 1 year (around 11/01/2023).     Marisue Ivan, NP  11/01/2022   This document may have been prepared by [facility] Voice Recognition software and as such may include unintentional dictation errors.

## 2022-11-02 LAB — CMP14+EGFR
ALT: 13 [IU]/L (ref 0–32)
AST: 13 [IU]/L (ref 0–40)
Albumin: 4.5 g/dL (ref 4.0–5.0)
Alkaline Phosphatase: 59 [IU]/L (ref 44–121)
BUN/Creatinine Ratio: 19 (ref 9–23)
BUN: 14 mg/dL (ref 6–20)
Bilirubin Total: 0.5 mg/dL (ref 0.0–1.2)
CO2: 25 mmol/L (ref 20–29)
Calcium: 9.4 mg/dL (ref 8.7–10.2)
Chloride: 103 mmol/L (ref 96–106)
Creatinine, Ser: 0.75 mg/dL (ref 0.57–1.00)
Globulin, Total: 2.2 g/dL (ref 1.5–4.5)
Glucose: 88 mg/dL (ref 70–99)
Potassium: 4.5 mmol/L (ref 3.5–5.2)
Sodium: 140 mmol/L (ref 134–144)
Total Protein: 6.7 g/dL (ref 6.0–8.5)
eGFR: 110 mL/min/{1.73_m2} (ref >59)

## 2022-11-02 LAB — CBC WITH DIFFERENTIAL/PLATELET
Basophils Absolute: 0 10*3/uL (ref 0.0–0.2)
Basos: 1 %
EOS (ABSOLUTE): 0.1 10*3/uL (ref 0.0–0.4)
Eos: 1 %
Hematocrit: 41.4 % (ref 34.0–46.6)
Hemoglobin: 13.7 g/dL (ref 11.1–15.9)
Immature Grans (Abs): 0 10*3/uL (ref 0.0–0.1)
Immature Granulocytes: 0 %
Lymphocytes Absolute: 2.1 10*3/uL (ref 0.7–3.1)
Lymphs: 42 %
MCH: 32.8 pg (ref 26.6–33.0)
MCHC: 33.1 g/dL (ref 31.5–35.7)
MCV: 99 fL — ABNORMAL HIGH (ref 79–97)
Monocytes Absolute: 0.4 10*3/uL (ref 0.1–0.9)
Monocytes: 8 %
Neutrophils Absolute: 2.4 10*3/uL (ref 1.4–7.0)
Neutrophils: 48 %
Platelets: 266 10*3/uL (ref 150–450)
RBC: 4.18 x10E6/uL (ref 3.77–5.28)
RDW: 12.1 % (ref 11.7–15.4)
WBC: 5 10*3/uL (ref 3.4–10.8)

## 2022-11-02 LAB — TSH: TSH: 3.3 u[IU]/mL (ref 0.450–4.500)

## 2022-11-02 LAB — LIPID PANEL
Chol/HDL Ratio: 3.2 ratio (ref 0.0–4.4)
Cholesterol, Total: 164 mg/dL (ref 100–199)
HDL: 51 mg/dL (ref >39)
LDL Chol Calc (NIH): 96 mg/dL (ref 0–99)
Triglycerides: 89 mg/dL (ref 0–149)
VLDL Cholesterol Cal: 17 mg/dL (ref 5–40)

## 2022-11-02 LAB — HEMOGLOBIN A1C
Est. average glucose Bld gHb Est-mCnc: 103 mg/dL
Hgb A1c MFr Bld: 5.2 % (ref 4.8–5.6)

## 2022-11-03 NOTE — Progress Notes (Signed)
Sent message via mychart

## 2023-01-09 DIAGNOSIS — R0602 Shortness of breath: Secondary | ICD-10-CM | POA: Diagnosis not present

## 2023-01-09 DIAGNOSIS — Z6831 Body mass index (BMI) 31.0-31.9, adult: Secondary | ICD-10-CM | POA: Diagnosis not present

## 2023-01-09 DIAGNOSIS — E6609 Other obesity due to excess calories: Secondary | ICD-10-CM | POA: Diagnosis not present

## 2023-01-09 DIAGNOSIS — Z3200 Encounter for pregnancy test, result unknown: Secondary | ICD-10-CM | POA: Diagnosis not present

## 2023-01-09 DIAGNOSIS — R5383 Other fatigue: Secondary | ICD-10-CM | POA: Diagnosis not present

## 2023-01-09 DIAGNOSIS — E559 Vitamin D deficiency, unspecified: Secondary | ICD-10-CM | POA: Diagnosis not present

## 2023-01-09 DIAGNOSIS — Z0000 Encounter for general adult medical examination without abnormal findings: Secondary | ICD-10-CM | POA: Diagnosis not present

## 2023-01-31 ENCOUNTER — Ambulatory Visit: Payer: Medicaid Other | Admitting: Certified Nurse Midwife

## 2023-01-31 ENCOUNTER — Encounter: Payer: Self-pay | Admitting: Certified Nurse Midwife

## 2023-01-31 VITALS — BP 123/90 | HR 94 | Ht 67.0 in | Wt 190.0 lb

## 2023-01-31 DIAGNOSIS — Z3202 Encounter for pregnancy test, result negative: Secondary | ICD-10-CM | POA: Diagnosis not present

## 2023-01-31 DIAGNOSIS — N912 Amenorrhea, unspecified: Secondary | ICD-10-CM

## 2023-01-31 DIAGNOSIS — N926 Irregular menstruation, unspecified: Secondary | ICD-10-CM

## 2023-01-31 LAB — POCT URINE PREGNANCY: Preg Test, Ur: NEGATIVE

## 2023-01-31 NOTE — Progress Notes (Signed)
[facility]   Chief Complaint  Patient presents with   Amenorrhea    3 neg UPTs at home, last one was this morning.    HPI:      Colleen Lang is a 28 y.o. [phone] whose LMP was Patient's last menstrual period was 12/23/2022 (exact date)., presents today for late period, headaches, & generally not feeling well for last week. She has a certain LMP of 12/14, last IC 12/30. Had flu ~2w ago. Reports cycles are regular and is concerned she may be pregnant.   Reports flu two weeks ago, had malaise but denies high fevers. Denies significant changes to activity, stress levels, weight changes or dietary changes. She took phentermine for several days prior to the flu diagnosis and stopped after ~4d due to side effects.    Patient Active Problem List   Diagnosis Date Noted   Encounter for annual health examination 11/01/2022   AFI (amniotic fluid index) increased 08/03/2018   Non-reassuring electronic fetal monitoring tracing 08/02/2018   Anemia of pregnancy in third trimester 08/02/2018   Prior fetal macrosomia, antepartum 07/16/2018   History of twin pregnancy in prior pregnancy 12/21/2017   Sinus tachycardia 01/12/2015   Menometrorrhagia 08/19/2014   History of ovarian cyst 08/19/2014    Past Surgical History:  Procedure Laterality Date   CYST REMOVAL TRUNK Left 06/2021   KNEE SURGERY     x2   TONSILLECTOMY     WISDOM TOOTH EXTRACTION      Family History  Problem Relation Age of Onset   Diabetes Paternal Grandmother    Diabetes Paternal Grandfather    Thyroid disease Mother    Healthy Father    Cancer Neg Hx    Ovarian cancer Neg Hx    Breast cancer Neg Hx    Heart disease Neg Hx     Social History   Socioeconomic History   Marital status: Married    Spouse name: Not on file   Number of children: Not on file   Years of education: Not on file   Highest education level: Not on file  Occupational History   Not on file  Tobacco Use   Smoking  status: Former    Current packs/day: 0.00    Types: Cigarettes    Quit date: 04/01/2019    Years since quitting: 3.8   Smokeless tobacco: Former  Building services engineer status: Every Day   Substances: Nicotine  Substance and Sexual Activity   Alcohol use: Yes    Comment: rare   Drug use: No   Sexual activity: Yes    Birth control/protection: None  Other Topics Concern   Not on file  Social History Narrative   Not on file   Social Drivers of Health   Financial Resource Strain: Not on file  Food Insecurity: Not on file  Transportation Needs: Not on file  Physical Activity: Not on file  Stress: Not on file  Social Connections: Not on file  Intimate Partner Violence: Not on file    Outpatient Medications Prior to Visit  Medication Sig Dispense Refill   phentermine (ADIPEX-P) 37.5 MG tablet Take 37.5 mg by mouth daily. (Patient not taking: Reported on 01/31/2023)     WEGOVY 0.25 MG/0.5ML SOAJ SMARTSIG:0.25 Milligram(s) SUB-Q Once a Week (Patient not taking: Reported on 01/31/2023)     No facility-administered medications prior to visit.      ROS:  Review of Systems  Constitutional:  Positive for fatigue.  Respiratory: Negative.    Cardiovascular: Negative.   Genitourinary:  Positive for menstrual problem and pelvic pain.     OBJECTIVE:   Vitals:  BP (!) 123/90   Pulse 94   Ht 5\' 7"  (1.702 m)   Wt 190 lb (86.2 kg)   LMP 12/23/2022 (Exact Date)   BMI 29.76 kg/m   Physical Exam Constitutional:      Appearance: Normal appearance.  Cardiovascular:     Rate and Rhythm: Normal rate.  Pulmonary:     Effort: Pulmonary effort is normal.  Abdominal:     General: Abdomen is flat.     Palpations: Abdomen is soft.     Tenderness: There is abdominal tenderness in the left lower quadrant.     Comments: Mild tenderness to LLQ on deep palpation.  Genitourinary:    Comments: Pelvic deferred. Neurological:     General: No focal deficit present.     Mental Status: She is  alert and oriented to person, place, and time.  Psychiatric:        Mood and Affect: Mood normal.        Behavior: Behavior normal.     Results: Results for orders placed or performed in visit on 01/31/23 (from the past 24 hours)  POCT urine pregnancy     Status: Normal   Collection Time: 01/31/23 10:25 AM  Result Value Ref Range   Preg Test, Ur Negative Negative     Assessment/Plan: Late menses - Plan: POCT urine pregnancy, Human Chorionic Gonadotropin (hCG),Quantitative (Serial Monitor) UPT in office negative. Hcg drawn given LLQ pain. Reviewed signs of ectopic pregnancy and when to present for urgent care. Discussed cycle could be late due to recent flu infection. She also reports a history of ovarian cysts, reviewed this may also be contributing to LLQ discomfort and irregularity of cycle. Will followup via MyChart with Hcg results.   No orders of the defined types were placed in this encounter.    Dominica Severin, CNM 01/31/2023 12:52 PM

## 2023-02-01 ENCOUNTER — Encounter: Payer: Self-pay | Admitting: Certified Nurse Midwife

## 2023-02-01 LAB — HUMAN CHORIONIC GONADOTROPIN(HCG),B-SUBUNIT,QUANTITATIVE): HCG, Beta Chain, Quant, S: <1 m[IU]/mL

## 2023-02-02 ENCOUNTER — Ambulatory Visit: Payer: Medicaid Other | Admitting: Cardiology

## 2023-02-08 ENCOUNTER — Other Ambulatory Visit: Payer: Self-pay | Admitting: Certified Nurse Midwife

## 2023-02-08 MED ORDER — ONDANSETRON 4 MG PO TBDP: 4.0000 mg | ORAL_TABLET | Freq: Three times a day (TID) | ORAL | 0 refills | Status: DC | PRN

## 2023-02-08 MED ORDER — IBUPROFEN 800 MG PO TABS: 800.0000 mg | ORAL_TABLET | Freq: Three times a day (TID) | ORAL | 1 refills | Status: AC | PRN

## 2023-02-12 ENCOUNTER — Ambulatory Visit
Admission: RE | Admit: 2023-02-12 | Discharge: 2023-02-12 | Disposition: A | Discharge status: Home / Self Care | Payer: Medicaid Other | Source: Ambulatory Visit | Attending: Emergency Medicine | Admitting: Emergency Medicine

## 2023-02-12 VITALS — BP 128/91 | HR 97 | Temp 98.5°F | Resp 20

## 2023-02-12 DIAGNOSIS — R0602 Shortness of breath: Secondary | ICD-10-CM | POA: Diagnosis not present

## 2023-02-12 DIAGNOSIS — R509 Fever, unspecified: Secondary | ICD-10-CM | POA: Insufficient documentation

## 2023-02-12 DIAGNOSIS — B9789 Other viral agents as the cause of diseases classified elsewhere: Secondary | ICD-10-CM | POA: Insufficient documentation

## 2023-02-12 DIAGNOSIS — J069 Acute upper respiratory infection, unspecified: Secondary | ICD-10-CM | POA: Diagnosis not present

## 2023-02-12 LAB — RESP PANEL BY RT-PCR (FLU A&B, COVID) ARPGX2
Influenza A by PCR: NEGATIVE
Influenza B by PCR: NEGATIVE
SARS Coronavirus 2 by RT PCR: NEGATIVE

## 2023-02-12 LAB — GROUP A STREP BY PCR: Group A Strep by PCR: NOT DETECTED

## 2023-02-12 MED ORDER — ONDANSETRON 4 MG PO TBDP: 4.0000 mg | ORAL_TABLET | Freq: Three times a day (TID) | ORAL | 0 refills | Status: DC | PRN

## 2023-02-12 MED ORDER — IPRATROPIUM BROMIDE 0.06 % NA SOLN: 2.0000 | Freq: Four times a day (QID) | NASAL | 12 refills | Status: DC

## 2023-02-12 NOTE — ED Provider Notes (Signed)
[facility]    CSN: [id number] Arrival date & time: 02/12/23  1656      History   Chief Complaint Chief Complaint  Patient presents with   Cough   Fever   Shortness of Breath    HPI Colleen Lang is a 28 y.o. female.   HPI  28 year old female with past medical history significant for allergic rhinitis, ovarian cyst, and pelvic pain in a female presents for evaluation of respiratory symptoms that began 2 days ago.  This consist of a fever with a Tmax of 100, slight runny nose and nasal congestion, sore throat, shortness of breath, and nausea.  She denies ear pain, cough, wheezing, vomiting or diarrhea, known sick contacts, or recent travel.  Past Medical History:  Diagnosis Date   AR (allergic rhinitis)    History of chlamydia infection 2014   Ovarian cyst    seen on u/s 6 months ago- per pt they were small   Pelvic pain in female     Patient Active Problem List   Diagnosis Date Noted   Encounter for annual health examination 11/01/2022   AFI (amniotic fluid index) increased 08/03/2018   Non-reassuring electronic fetal monitoring tracing 08/02/2018   Anemia of pregnancy in third trimester 08/02/2018   Prior fetal macrosomia, antepartum 07/16/2018   History of twin pregnancy in prior pregnancy 12/21/2017   Sinus tachycardia 01/12/2015   Menometrorrhagia 08/19/2014   History of ovarian cyst 08/19/2014    Past Surgical History:  Procedure Laterality Date   CYST REMOVAL TRUNK Left 06/2021   KNEE SURGERY     x2   TONSILLECTOMY     WISDOM TOOTH EXTRACTION      OB History     Gravida  3   Para  3   Term  2   Preterm  1   AB      Living  4      SAB      IAB      Ectopic      Multiple  1   Live Births  4            Home Medications    Prior to Admission medications   Medication Sig Start Date End Date Taking? Authorizing Provider  ibuprofen (ADVIL) 800 MG tablet Take 1 tablet (800 mg total) by mouth every 8 (eight) hours as  needed for mild pain (pain score 1-3), moderate pain (pain score 4-6) or cramping. 02/08/23   Dominica Severin, CNM  ipratropium (ATROVENT) 0.06 % nasal spray Place 2 sprays into both nostrils 4 (four) times daily. 02/12/23  Yes Becky Augusta, NP  phentermine (ADIPEX-P) 37.5 MG tablet Take 37.5 mg by mouth daily before breakfast.   Yes [provider]  ondansetron (ZOFRAN-ODT) 4 MG disintegrating tablet Take 1 tablet (4 mg total) by mouth every 8 (eight) hours as needed for nausea or vomiting. 02/12/23   Becky Augusta, NP  phentermine (ADIPEX-P) 37.5 MG tablet Take 37.5 mg by mouth daily. Patient not taking: Reported on 01/31/2023 01/09/23   [provider]  WEGOVY 0.25 MG/0.5ML SOAJ SMARTSIG:0.25 Milligram(s) SUB-Q Once a Week Patient not taking: Reported on 01/31/2023 01/12/23   [provider]    Family History Family History  Problem Relation Age of Onset   Diabetes Paternal Grandmother    Diabetes Paternal Grandfather    Thyroid disease Mother    Healthy Father    Cancer Neg Hx    Ovarian cancer Neg Hx  Breast cancer Neg Hx    Heart disease Neg Hx     Social History Social History   Tobacco Use   Smoking status: Former    Current packs/day: 0.00    Types: Cigarettes    Quit date: 04/01/2019    Years since quitting: 3.8   Smokeless tobacco: Former  Building services engineer status: Every Day   Substances: Nicotine  Substance Use Topics   Alcohol use: Yes    Comment: rare   Drug use: No     Allergies   Tramadol, Ultram [tramadol hcl], and Other   Review of Systems Review of Systems  Constitutional:  Positive for fever.  HENT:  Positive for congestion, rhinorrhea and sore throat. Negative for ear pain.   Respiratory:  Positive for shortness of breath. Negative for cough and wheezing.   Gastrointestinal:  Positive for nausea. Negative for diarrhea and vomiting.     Physical Exam Triage Vital Signs ED Triage Vitals  Encounter Vitals Group      BP 02/12/23 1713 (!) 128/91     Systolic BP Percentile --      Diastolic BP Percentile --      Pulse Rate 02/12/23 1713 97     Resp 02/12/23 1713 20     Temp 02/12/23 1713 98.5 F (36.9 C)     Temp Source 02/12/23 1713 Oral     SpO2 02/12/23 1713 99 %     Weight --      Height --      Head Circumference --      Peak Flow --      Pain Score 02/12/23 1711 6     Pain Loc --      Pain Education --      Exclude from Growth Chart --    No data found.  Updated Vital Signs BP (!) 128/91 (BP Location: Right Arm)   Pulse 97   Temp 98.5 F (36.9 C) (Oral)   Resp 20   LMP 01/24/2023 (Exact Date)   SpO2 99%   Visual Acuity Right Eye Distance:   Left Eye Distance:   Bilateral Distance:    Right Eye Near:   Left Eye Near:    Bilateral Near:     Physical Exam Vitals and nursing note reviewed.  Constitutional:      Appearance: Normal appearance. She is not ill-appearing.  HENT:     Head: Normocephalic and atraumatic.     Right Ear: Tympanic membrane, ear canal and external ear normal. There is no impacted cerumen.     Left Ear: Tympanic membrane, ear canal and external ear normal. There is no impacted cerumen.     Nose: Congestion and rhinorrhea present.     Comments: Nasal mucosa is mildly erythematous and edematous with scant clear discharge in both nares.    Mouth/Throat:     Mouth: Mucous membranes are moist.     Pharynx: Oropharynx is clear. Posterior oropharyngeal erythema present. No oropharyngeal exudate.     Comments: Mild erythema to the posterior pharynx with clear postnasal drip.  Tonsillar pillars are surgically absent. Cardiovascular:     Rate and Rhythm: Normal rate and regular rhythm.     Pulses: Normal pulses.     Heart sounds: Normal heart sounds. No murmur heard.    No friction rub. No gallop.  Pulmonary:     Effort: Pulmonary effort is normal.     Breath sounds: Normal breath sounds. No wheezing, rhonchi or rales.  Skin:    General: Skin is warm and  dry.     Capillary Refill: Capillary refill takes less than 2 seconds.     Findings: No rash.  Neurological:     General: No focal deficit present.     Mental Status: She is alert and oriented to person, place, and time.      UC Treatments / Results  Labs (all labs ordered are listed, but only abnormal results are displayed) Labs Reviewed  GROUP A STREP BY PCR  RESP PANEL BY RT-PCR (FLU A&B, COVID) [id number]    EKG   Radiology No results found.  Procedures Procedures (including critical care time)  Medications Ordered in UC Medications - No data to display  Initial Impression / Assessment and Plan / UC Course  I have reviewed the triage vital signs and the nursing notes.  Pertinent labs & imaging results that were available during my care of the patient were reviewed by me and considered in my medical decision making (see chart for details).   Patient is a pleasant, nontoxic-appearing 28 year old female presenting for evaluation of respiratory symptoms as outlined HPI above.  Her physical exam does reveal inflammation of her upper respiratory tract as evidenced by inflamed nasal mucosa with scant clear nasal discharge.  Tonsillar pillars are surgically absent though there is erythema and clear postnasal drip to the posterior oropharynx.  No cervical lymphadenopathy on exam.  Cardiopulmonary exam reveals clear lung sounds in all fields.  Differential diagnosis include COVID, influenza, strep, viral illness.  I will order a COVID and influenza PCR along with a strep PCR.  Strep PCR is negative.  Respiratory panel is negative for COVID or influenza.  I will discharge patient home with a diagnosis of viral URI with a prescription for Atrovent nasal spray to help with nasal congestion.  She should continue taking over-the-counter Tylenol and/or ibuprofen as needed for fever or pain.  I will also prescribe Zofran that she can use as needed for nausea.  Return precautions reviewed.   Work note provided.   Final Clinical Impressions(s) / UC Diagnoses   Final diagnoses:  Viral upper respiratory tract infection     Discharge Instructions      Your testing today was negative for strep, COVID, and influenza.  Your exam is consistent with a viral respiratory infection.  Continue to use over-the-counter Tylenol and/or ibuprofen according the package instructions as needed for any fever or pain.  I am going to prescribe you Zofran and you can use 4 mg every 8 hours as needed for nausea or vomiting.  Use the Atrovent nasal spray, 2 squirts up each nostril every 6 hours, as needed for runny nose nasal congestion.  If you develop any new or worsening symptoms either return for reevaluation or follow-up with your primary care provider.     ED Prescriptions     Medication Sig Dispense Auth. Provider   ondansetron (ZOFRAN-ODT) 4 MG disintegrating tablet Take 1 tablet (4 mg total) by mouth every 8 (eight) hours as needed for nausea or vomiting. 20 tablet Becky Augusta, NP   ipratropium (ATROVENT) 0.06 % nasal spray Place 2 sprays into both nostrils 4 (four) times daily. 15 mL Becky Augusta, NP      PDMP not reviewed this encounter.   Becky Augusta, NP 02/12/23 [phone]

## 2023-02-12 NOTE — Discharge Instructions (Signed)
Your testing today was negative for strep, COVID, and influenza.  Your exam is consistent with a viral respiratory infection.  Continue to use over-the-counter Tylenol and/or ibuprofen according the package instructions as needed for any fever or pain.  I am going to prescribe you Zofran and you can use 4 mg every 8 hours as needed for nausea or vomiting.  Use the Atrovent nasal spray, 2 squirts up each nostril every 6 hours, as needed for runny nose nasal congestion.  If you develop any new or worsening symptoms either return for reevaluation or follow-up with your primary care provider.

## 2023-02-12 NOTE — ED Triage Notes (Addendum)
Sx x 2 days  SOB Chest congestion Sore throat fever Patient vapes.

## 2023-02-16 DIAGNOSIS — Z3200 Encounter for pregnancy test, result unknown: Secondary | ICD-10-CM | POA: Diagnosis not present

## 2023-02-16 DIAGNOSIS — E6609 Other obesity due to excess calories: Secondary | ICD-10-CM | POA: Diagnosis not present

## 2023-02-16 DIAGNOSIS — Z6831 Body mass index (BMI) 31.0-31.9, adult: Secondary | ICD-10-CM | POA: Diagnosis not present

## 2023-02-18 ENCOUNTER — Emergency Department: Payer: Medicaid Other

## 2023-02-18 ENCOUNTER — Other Ambulatory Visit: Payer: Self-pay

## 2023-02-18 ENCOUNTER — Emergency Department
Admission: EM | Admit: 2023-02-18 | Discharge: 2023-02-18 | Disposition: A | Discharge status: Home / Self Care | Payer: Medicaid Other | Attending: Emergency Medicine | Admitting: Emergency Medicine

## 2023-02-18 DIAGNOSIS — R197 Diarrhea, unspecified: Secondary | ICD-10-CM | POA: Diagnosis not present

## 2023-02-18 DIAGNOSIS — K760 Fatty (change of) liver, not elsewhere classified: Secondary | ICD-10-CM | POA: Diagnosis not present

## 2023-02-18 DIAGNOSIS — R1084 Generalized abdominal pain: Secondary | ICD-10-CM | POA: Diagnosis not present

## 2023-02-18 DIAGNOSIS — R112 Nausea with vomiting, unspecified: Secondary | ICD-10-CM | POA: Diagnosis not present

## 2023-02-18 DIAGNOSIS — N3000 Acute cystitis without hematuria: Secondary | ICD-10-CM | POA: Diagnosis not present

## 2023-02-18 DIAGNOSIS — R1031 Right lower quadrant pain: Secondary | ICD-10-CM | POA: Diagnosis not present

## 2023-02-18 LAB — URINALYSIS, ROUTINE W REFLEX MICROSCOPIC
Bilirubin Urine: NEGATIVE
Glucose, UA: NEGATIVE mg/dL
Ketones, ur: 20 mg/dL — AB
Nitrite: NEGATIVE
Protein, ur: 100 mg/dL — AB
Specific Gravity, Urine: 1.026 (ref 1.005–1.030)
pH: 5 (ref 5.0–8.0)

## 2023-02-18 LAB — COMPREHENSIVE METABOLIC PANEL
ALT: 19 U/L (ref 0–44)
AST: 18 U/L (ref 15–41)
Albumin: 5.5 g/dL — ABNORMAL HIGH (ref 3.5–5.0)
Alkaline Phosphatase: 52 U/L (ref 38–126)
Anion gap: 15 (ref 5–15)
BUN: 11 mg/dL (ref 6–20)
CO2: 24 mmol/L (ref 22–32)
Calcium: 10 mg/dL (ref 8.9–10.3)
Chloride: 100 mmol/L (ref 98–111)
Creatinine, Ser: 0.8 mg/dL (ref 0.44–1.00)
GFR, Estimated: >60 mL/min (ref >60)
Glucose, Bld: 94 mg/dL (ref 70–99)
Potassium: 3.6 mmol/L (ref 3.5–5.1)
Sodium: 139 mmol/L (ref 135–145)
Total Bilirubin: 1.3 mg/dL — ABNORMAL HIGH (ref 0.0–1.2)
Total Protein: 8.3 g/dL — ABNORMAL HIGH (ref 6.5–8.1)

## 2023-02-18 LAB — CBC
HCT: 42.9 % (ref 36.0–46.0)
Hemoglobin: 15.2 g/dL — ABNORMAL HIGH (ref 12.0–15.0)
MCH: 32.8 pg (ref 26.0–34.0)
MCHC: 35.4 g/dL (ref 30.0–36.0)
MCV: 92.7 fL (ref 80.0–100.0)
Platelets: 330 10*3/uL (ref 150–400)
RBC: 4.63 MIL/uL (ref 3.87–5.11)
RDW: 11.9 % (ref 11.5–15.5)
WBC: 15.4 10*3/uL — ABNORMAL HIGH (ref 4.0–10.5)
nRBC: 0 % (ref 0.0–0.2)

## 2023-02-18 LAB — LIPASE, BLOOD: Lipase: 26 U/L (ref 11–51)

## 2023-02-18 LAB — POC URINE PREG, ED: Preg Test, Ur: NEGATIVE

## 2023-02-18 MED ORDER — CEPHALEXIN 500 MG PO CAPS: 500.0000 mg | ORAL_CAPSULE | Freq: Four times a day (QID) | ORAL | 0 refills | Status: AC

## 2023-02-18 MED ORDER — ONDANSETRON HCL 4 MG/2ML IJ SOLN
4.0000 mg | Freq: Once | INTRAMUSCULAR | Status: AC
  Administered 2023-02-18: 4 mg via INTRAVENOUS
  Filled 2023-02-18: qty 2

## 2023-02-18 MED ORDER — KETOROLAC TROMETHAMINE 15 MG/ML IJ SOLN
15.0000 mg | Freq: Once | INTRAMUSCULAR | Status: AC
  Administered 2023-02-18: 15 mg via INTRAVENOUS
  Filled 2023-02-18: qty 1

## 2023-02-18 MED ORDER — SODIUM CHLORIDE 0.9 % IV BOLUS
500.0000 mL | Freq: Once | INTRAVENOUS | Status: AC
  Administered 2023-02-18: 500 mL via INTRAVENOUS

## 2023-02-18 MED ORDER — IOHEXOL 300 MG/ML  SOLN
100.0000 mL | Freq: Once | INTRAMUSCULAR | Status: AC | PRN
  Administered 2023-02-18: 100 mL via INTRAVENOUS

## 2023-02-18 NOTE — ED Provider Notes (Signed)
 [facility] Provider Note    Event Date/Time   First MD Initiated Contact with Patient 02/18/23 1337     (approximate)   History   Abdominal Pain and Emesis   HPI  Colleen Lang is a 28 y.o. female who presents today for evaluation of right lower quadrant pain and left lower quadrant pain.  Patient reports that the pain began this morning and is sharp and cramping.  She reports that she has had 2 episodes of vomiting.  She has also had diarrhea that began yesterday.  No fevers or chills.  No vaginal discharge or bleeding.  No burning with urination.  Her pain is nonradiating.  No history of abdominal surgery.  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  Patient Active Problem List   Diagnosis Date Noted   Encounter for annual health examination 11/01/2022   AFI (amniotic fluid index) increased 08/03/2018   Non-reassuring electronic fetal monitoring tracing 08/02/2018   Anemia of pregnancy in third trimester 08/02/2018   Prior fetal macrosomia, antepartum 07/16/2018   History of twin pregnancy in prior pregnancy 12/21/2017   Sinus tachycardia 01/12/2015   Menometrorrhagia 08/19/2014   History of ovarian cyst 08/19/2014          Physical Exam   Triage Vital Signs: ED Triage Vitals  Encounter Vitals Group     BP 02/18/23 1251 (!) 117/101     Systolic BP Percentile --      Diastolic BP Percentile --      Pulse Rate 02/18/23 1251 (!) 136     Resp 02/18/23 1251 20     Temp 02/18/23 1251 98.4 F (36.9 C)     Temp Source 02/18/23 1251 Oral     SpO2 02/18/23 1251 100 %     Weight 02/18/23 1253 185 lb (83.9 kg)     Height 02/18/23 1253 5' 6 (1.676 m)     Head Circumference --      Peak Flow --      Pain Score 02/18/23 1248 5     Pain Loc --      Pain Education --      Exclude from Growth Chart --     Most recent vital signs: Vitals:   02/18/23 1251 02/18/23 1456  BP: (!) 117/101 114/89  Pulse:  (!) 136 96  Resp: 20 20  Temp: 98.4 F (36.9 C) 98.4 F (36.9 C)  SpO2: 100% 100%    Physical Exam Vitals and nursing note reviewed.  Constitutional:      General: Awake and alert. Uncomfortable appearing    Appearance: Normal appearance. The patient is normal weight.  HENT:     Head: Normocephalic and atraumatic.     Mouth: Mucous membranes are moist.  Eyes:     General: PERRL. Normal EOMs        Right eye: No discharge.        Left eye: No discharge.     Conjunctiva/sclera: Conjunctivae normal.  Cardiovascular:     Rate and Rhythm: Normal rate and regular rhythm.     Pulses: Normal pulses.  Pulmonary:     Effort: Pulmonary effort is normal. No respiratory distress.     Breath sounds: Normal breath sounds.  Abdominal:     Abdomen is soft. There is RLQ abdominal tenderness. No rebound or guarding. No distention. +rovsing Musculoskeletal:        General: No swelling. Normal range of  motion.     Cervical back: Normal range of motion and neck supple.  Skin:    General: Skin is warm and dry.     Capillary Refill: Capillary refill takes less than 2 seconds.     Findings: No rash.  Neurological:     Mental Status: The patient is awake and alert.      ED Results / Procedures / Treatments   Labs (all labs ordered are listed, but only abnormal results are displayed) Labs Reviewed  COMPREHENSIVE METABOLIC PANEL - Abnormal; Notable for the following components:      Result Value   Total Protein 8.3 (*)    Albumin 5.5 (*)    Total Bilirubin 1.3 (*)    All other components within normal limits  CBC - Abnormal; Notable for the following components:   WBC 15.4 (*)    Hemoglobin 15.2 (*)    All other components within normal limits  URINALYSIS, ROUTINE W REFLEX MICROSCOPIC - Abnormal; Notable for the following components:   Color, Urine AMBER (*)    APPearance HAZY (*)    Hgb urine dipstick SMALL (*)    Ketones, ur 20 (*)    Protein, ur 100 (*)    Leukocytes,Ua SMALL (*)     Bacteria, UA RARE (*)    All other components within normal limits  LIPASE, BLOOD  POC URINE PREG, ED     EKG     RADIOLOGY I independently reviewed and interpreted imaging and agree with radiologists findings.     PROCEDURES:  Critical Care performed:   Procedures   MEDICATIONS ORDERED IN ED: Medications  sodium chloride  0.9 % bolus 500 mL (0 mLs Intravenous Stopped 02/18/23 1349)  ondansetron  (ZOFRAN ) injection 4 mg (4 mg Intravenous Given 02/18/23 1345)  ketorolac  (TORADOL ) 15 MG/ML injection 15 mg (15 mg Intravenous Given 02/18/23 1345)  iohexol  (OMNIPAQUE ) 300 MG/ML solution 100 mL (100 mLs Intravenous Contrast Given 02/18/23 1401)     IMPRESSION / MDM / ASSESSMENT AND PLAN / ED COURSE  I reviewed the triage vital signs and the nursing notes.   Differential diagnosis includes, but is not limited to, gastroenteritis, appendicitis, diverticulitis, dehydration, UTI, ovarian cyst, ovarian torsion.  Patient presented to the emergency department tachycardic and anxious appearing, though normotensive and afebrile.  Upon prompt recheck of her vital signs, her heart rate had improved.  She was treated symptomatically with Toradol , Zofran , and fluids while awaiting workup.  Labs are obtained in triage and reveal no leukocytosis.  LFTs and lipase are within normal limits.  Her urinalysis is mildly suggestive of urinary tract infection, urine pregnancy is negative.  CT scan obtained given concern for possible appendicitis, though this was negative.  Upon reevaluation, patient reports that she feels improved, though not resolved, and therefore we discussed the option of pelvic ultrasound which patient agreed with, this was negative.  Discussed with her multiple times the possibility of STD/vaginal discharge and she adamantly declines concern for STD.  She was started on antibiotics for possible UTI.  She was reevaluated multiple times and continued to have improvement of her symptoms.   Given her nausea, vomiting, diarrhea, and abdominal cramping, gastroenteritis is quite possible.  We discussed bland diet, hydration, and strict return precautions.  Patient understands and agrees with plan.  She was discharged in stable condition.  She was given a work note per her request.   Patient's presentation is most consistent with acute complicated illness / injury requiring diagnostic workup.   Clinical  Course as of 02/18/23 1731  Sun Feb 18, 2023  1706 Upon reevaluation, patient reports that she feels improved. [JP]    Clinical Course User Index [JP] Kiera Hussey E, PA-C     FINAL CLINICAL IMPRESSION(S) / ED DIAGNOSES   Final diagnoses:  Generalized abdominal pain  Nausea vomiting and diarrhea  Acute cystitis without hematuria     Rx / DC Orders   ED Discharge Orders          Ordered    cephALEXin  (KEFLEX ) 500 MG capsule  4 times daily        02/18/23 1708             Note:  This document was prepared using Dragon voice recognition software and may include unintentional dictation errors.   Chara Marquard E, PA-C 02/18/23 1731    Dorothyann Drivers, MD 02/19/23 1513

## 2023-02-18 NOTE — Discharge Instructions (Signed)
 Your CT scan and ultrasound were normal today.  You may take the antibiotic for possible urinary tract infection.  It is also possibility of a gastric Tritus given your loose stools and vomiting.  Please have a very bland diet and stay hydrated.  Please return to the emergency department immediately for any new, worsening, or change in symptoms or other concerns including worsening pain, fever, or any other concerns or changes in your symptoms.  It was a pleasure caring for you today.

## 2023-02-18 NOTE — ED Triage Notes (Signed)
 Pt to ED for vomiting (2 episodes) and RLQ pain since this AM. Does have appendix. LMP 01/24/23 and was "12 days late" and lasted 2 days but had negative preg test by PCP 1 week ago. Denies urinary symptoms.

## 2023-02-20 DIAGNOSIS — N739 Female pelvic inflammatory disease, unspecified: Secondary | ICD-10-CM | POA: Diagnosis not present

## 2023-02-23 ENCOUNTER — Encounter: Payer: Self-pay | Admitting: Obstetrics and Gynecology

## 2023-03-22 DIAGNOSIS — J309 Allergic rhinitis, unspecified: Secondary | ICD-10-CM | POA: Diagnosis not present

## 2023-03-22 DIAGNOSIS — E6609 Other obesity due to excess calories: Secondary | ICD-10-CM | POA: Diagnosis not present

## 2023-03-22 DIAGNOSIS — Z6831 Body mass index (BMI) 31.0-31.9, adult: Secondary | ICD-10-CM | POA: Diagnosis not present

## 2023-04-06 DIAGNOSIS — S61239A Puncture wound without foreign body of unspecified finger without damage to nail, initial encounter: Secondary | ICD-10-CM | POA: Diagnosis not present

## 2023-04-25 ENCOUNTER — Encounter: Payer: Self-pay | Admitting: Obstetrics and Gynecology

## 2023-04-25 ENCOUNTER — Ambulatory Visit: Admitting: Obstetrics and Gynecology

## 2023-04-25 VITALS — BP 119/84 | HR 107 | Temp 98.4°F | Ht 67.0 in | Wt 166.0 lb

## 2023-04-25 DIAGNOSIS — M6289 Other specified disorders of muscle: Secondary | ICD-10-CM

## 2023-04-25 DIAGNOSIS — R102 Pelvic and perineal pain: Secondary | ICD-10-CM | POA: Diagnosis not present

## 2023-04-25 NOTE — Progress Notes (Signed)
    GYNECOLOGY PROGRESS NOTE  Subjective:    Patient ID: Colleen Lang, female    DOB: 01/05/1996, 28 y.o.   MRN: [id number]  HPI  Patient is a 28 y.o. [phone] female who presents for evaluation for pelvic pain. She reports that she was seen in the Emergency Room at [facility] on 02/20/2023, was diagnosed with  PID,  she was treated with several antibiotic , however review of chart notes cultures were negative except for bacterial vaginosis. Patient states she took the Metronidazole but did take any of the other prescribed antibiotics.  She did not feel much improvement after completing antibiotics.  While she is not experiencing pain at the moment, she does report significant pain when using her abdominal muscles and also ~ 1 day after intercourse.  She does report new onset difficulty urinating and passing stool.  Feels that she can't empty her bladder completely (although this has been going on for much longer).  Also feels like she has to go more frequently when exercising. Has to sit in certain positions in order to empty her bladder or have a BM.   The following portions of the patient's history were reviewed and updated as appropriate: allergies, current medications, past family history, past medical history, past social history, past surgical history, and problem list.  Review of Systems Pertinent items are noted in HPI.   Objective:   Blood pressure 119/84, pulse (!) 107, temperature 98.4 F (36.9 C), temperature source Oral, height 5\' 7"  (1.702 m), weight 166 lb (75.3 kg), last menstrual period 04/06/2023. Body mass index is 26 kg/m. General appearance: alert, cooperative, and no distress Abdomen: soft, non-tender; bowel sounds normal; no masses,  no organomegaly Pelvic: external genitalia normal, rectovaginal septum normal.  Vagina with small amount of watery clear discharge.  Some pelvic relaxation noted but no obvious descensus present. Cervix normal appearing, no lesions and  no motion tenderness.  Uterus mobile, nontender, normal shape and size.  Adnexae non-palpable, nontender bilaterally.  Extremities: extremities normal, atraumatic, no cyanosis or edema Neurologic: Grossly normal   Assessment:   1. Pelvic pain   2. Pelvic floor dysfunction in female      Plan:   - Reviewed chart from ER admission at [facility].  - Discussion had with patient regarding likely needed therapy with pelvic floor physical therapist. May also need to consider chiropractor, as she is also reporting that her ribs seem slightly off balance, and was told a long time ago after the birth of one of her children that 1 leg was slightly shorter than the other.  Referral placed.    Call or return to clinic prn if these symptoms worsen or fail to improve as anticipated.   A total of 22 minutes were spent during this encounter, including review of previous progress notes, recent imaging and labs, face-to-face with time with patient involving counseling and coordination of care, as well as documentation for current visit.   Teresa Fender, MD [facility]

## 2023-04-30 ENCOUNTER — Encounter: Payer: Self-pay | Admitting: Obstetrics and Gynecology

## 2023-05-01 MED ORDER — VALACYCLOVIR HCL 1 G PO TABS: 2000.0000 mg | ORAL_TABLET | Freq: Two times a day (BID) | ORAL | 2 refills | Status: AC

## 2023-07-05 ENCOUNTER — Encounter: Payer: Self-pay | Admitting: Certified Nurse Midwife

## 2023-07-05 ENCOUNTER — Ambulatory Visit: Admitting: Certified Nurse Midwife

## 2023-07-05 VITALS — BP 124/88 | HR 101 | Ht 67.0 in | Wt 169.1 lb

## 2023-07-05 DIAGNOSIS — R102 Pelvic and perineal pain: Secondary | ICD-10-CM

## 2023-07-05 DIAGNOSIS — N941 Unspecified dyspareunia: Secondary | ICD-10-CM

## 2023-07-05 DIAGNOSIS — R300 Dysuria: Secondary | ICD-10-CM | POA: Diagnosis not present

## 2023-07-05 DIAGNOSIS — N926 Irregular menstruation, unspecified: Secondary | ICD-10-CM | POA: Diagnosis not present

## 2023-07-05 DIAGNOSIS — K5900 Constipation, unspecified: Secondary | ICD-10-CM

## 2023-07-05 MED ORDER — POLYETHYLENE GLYCOL 3350 17 G PO PACK
17.0000 g | PACK | Freq: Every day | ORAL | 11 refills | Status: AC
Start: 2023-07-05 — End: ?

## 2023-07-05 MED ORDER — DOCUSATE SODIUM 100 MG PO CAPS: 100.0000 mg | ORAL_CAPSULE | Freq: Two times a day (BID) | ORAL | 11 refills | Status: AC

## 2023-07-06 ENCOUNTER — Encounter: Payer: Self-pay | Admitting: Certified Nurse Midwife

## 2023-07-06 LAB — URINALYSIS, ROUTINE W REFLEX MICROSCOPIC
Bilirubin, UA: NEGATIVE
Glucose, UA: NEGATIVE
Ketones, UA: NEGATIVE
Nitrite, UA: NEGATIVE
Protein,UA: NEGATIVE
RBC, UA: NEGATIVE
Specific Gravity, UA: 1.011 (ref 1.005–1.030)
Urobilinogen, Ur: 0.2 mg/dL (ref 0.2–1.0)
pH, UA: 6 (ref 5.0–7.5)

## 2023-07-06 LAB — MICROSCOPIC EXAMINATION: Casts: NONE SEEN /LPF

## 2023-07-06 NOTE — Progress Notes (Signed)
 [facility]   Chief Complaint  Patient presents with   Vaginal Pain   Vaginal Bleeding    HPI:      Colleen Lang is a 28 y.o. [id number] whose LMP was Patient's last menstrual period was 06/12/2023 (exact date)., presents today for pelvic pain, irregular and heavy bleeding, pain with intercourse, painful urination. Her most recent period lasted longer & was heavier, 5-7d with golf ball size clots, cycle usually 3-5d. She has continued to have cramping, pelvic and abdominal pain. She was seen at [facility] earlier this year and diagnosed with PID, however all tests for STI were negative and while she did test positive for BV and was treated these symptoms have not resolved. This pain has been ongoing for at least 6 months. She reports a family history of endometriosis. Her bowel movements are irregular, firm & difficult to pass as well.    Patient Active Problem List   Diagnosis Date Noted   History of twin pregnancy in prior pregnancy 12/21/2017   Sinus tachycardia 01/12/2015   Menometrorrhagia 08/19/2014   History of ovarian cyst 08/19/2014    Past Surgical History:  Procedure Laterality Date   CYST REMOVAL TRUNK Left 06/2021   KNEE SURGERY     x2   TONSILLECTOMY     WISDOM TOOTH EXTRACTION      Family History  Problem Relation Age of Onset   Diabetes Paternal Grandmother    Diabetes Paternal Grandfather    Thyroid  disease Mother    Healthy Father    Cancer Neg Hx    Ovarian cancer Neg Hx    Breast cancer Neg Hx    Heart disease Neg Hx     Social History   Socioeconomic History   Marital status: Married    Spouse name: Not on file   Number of children: Not on file   Years of education: Not on file   Highest education level: Not on file  Occupational History   Not on file  Tobacco Use   Smoking status: Former    Current packs/day: 0.00    Types: Cigarettes    Quit date: 04/01/2019    Years since quitting: 4.2   Smokeless tobacco: Former   Building services engineer status: Every Day   Substances: Nicotine   Substance and Sexual Activity   Alcohol use: Yes    Comment: rare   Drug use: No   Sexual activity: Yes    Birth control/protection: None  Other Topics Concern   Not on file  Social History Narrative   Not on file   Social Drivers of Health   Financial Resource Strain: Not on file  Food Insecurity: Not on file  Transportation Needs: Not on file  Physical Activity: Not on file  Stress: Not on file  Social Connections: Not on file  Intimate Partner Violence: Not on file    Outpatient Medications Prior to Visit  Medication Sig Dispense Refill   ibuprofen  (ADVIL ) 800 MG tablet Take 1 tablet (800 mg total) by mouth every 8 (eight) hours as needed for mild pain (pain score 1-3), moderate pain (pain score 4-6) or cramping. 60 tablet 1   ondansetron  (ZOFRAN -ODT) 4 MG disintegrating tablet Take 1 tablet (4 mg total) by mouth every 8 (eight) hours as needed for nausea or vomiting. (Patient not taking: Reported on 07/05/2023) 20 tablet 0   phentermine (ADIPEX-P) 37.5 MG tablet Take 37.5 mg by mouth daily before breakfast. (Patient not taking: Reported  on 07/05/2023)     WEGOVY 0.25 MG/0.5ML SOAJ SMARTSIG:0.25 Milligram(s) SUB-Q Once a Week (Patient not taking: Reported on 07/05/2023)     No facility-administered medications prior to visit.      ROS:  Review of Systems  Constitutional:  Positive for fatigue.  Respiratory: Negative.    Cardiovascular: Negative.   Gastrointestinal:  Positive for abdominal pain and constipation.  Genitourinary:  Positive for dyspareunia, dysuria, menstrual problem, pelvic pain and vaginal bleeding.     OBJECTIVE:   Vitals:  BP 124/88   Pulse (!) 101   Ht 5' 7 (1.702 m)   Wt 169 lb 1.6 oz (76.7 kg)   BMI 26.48 kg/m   Physical Exam Vitals reviewed.  Constitutional:      General: She is not in acute distress.    Appearance: Normal appearance. She is not ill-appearing.    Cardiovascular:     Rate and Rhythm: Normal rate.  Pulmonary:     Effort: Pulmonary effort is normal.  Abdominal:     General: Abdomen is flat. There is no distension.     Palpations: Abdomen is soft.  Genitourinary:    General: Normal vulva.     Vagina: Normal.     Cervix: No cervical motion tenderness.     Uterus: Tender. Not enlarged.      Adnexa:        Right: Tenderness present. No mass.         Left: No mass or tenderness.       Comments: Speculum exam reveals pink well rugated vagina, tissue intact. Multiparous cervical os, NuSwab collected. On bimanual tender to RLQ and at symphysis pubis, however no fundal tenderness & no cervical motion tenderness  Skin:    General: Skin is warm and dry.   Neurological:     General: No focal deficit present.     Mental Status: She is alert and oriented to person, place, and time.   Psychiatric:        Mood and Affect: Mood normal.        Behavior: Behavior normal.     Results: Results for orders placed or performed in visit on 07/05/23 (from the past 24 hours)  Urinalysis, Routine w reflex microscopic     Status: Abnormal   Collection Time: 07/05/23  4:40 PM  Result Value Ref Range   Specific Gravity, UA 1.011 1.005 - 1.030   pH, UA 6.0 5.0 - 7.5   Color, UA Yellow Yellow   Appearance Ur Clear Clear   Leukocytes,UA Trace (A) Negative   Protein,UA Negative Negative/Trace   Glucose, UA Negative Negative   Ketones, UA Negative Negative   RBC, UA Negative Negative   Bilirubin, UA Negative Negative   Urobilinogen, Ur 0.2 0.2 - 1.0 mg/dL   Nitrite, UA Negative Negative   Microscopic Examination See below:    Narrative   Performed at:  [address] [employer] [facility] [address], [place], [name]  [id number] Lab Director: Frankey Sas MD, Phone:  [phone]  Microscopic Examination     Status: Abnormal   Collection Time: 07/05/23  4:40 PM  Result Value Ref Range   WBC, UA 6-10 (A) 0 - 5 /hpf   RBC, Urine 0-2 0 - 2 /hpf    Epithelial Cells (non renal) 0-10 0 - 10 /hpf   Casts None seen None seen /lpf   Bacteria, UA Few None seen/Few   Narrative   Performed at:  01 - [facility] [address], [place],  [name]  [id number] Lab Director: Frankey Sas MD, Phone:  [phone]     Assessment/Plan: Pelvic pain - Plan: NuSwab VG Plus+Mycoplasmas,NAA, Urinalysis, Routine w reflex microscopic, Urine Culture  Irregular menses - Plan: NuSwab VG Plus+Mycoplasmas,NAA  Dysuria - Plan: Urinalysis, Routine w reflex microscopic, Urine Culture  No evidence of PID on exam and no history of STI. NuSwab collected to assess for ureaplasma or mycoplasma that could contribute to postcoital bleeding or pain. Recent ultrasound (2/25) without evidence of fibroids. Recommend use of magnesium or miralax  for laxative-continue daily for at least 3-5d until BM regular & pass without difficulty, hold for loose stool/diarrhea. Start bid docusate. If no relief of constipation with bowel regimen, recommend GI referral for further assessment.  Discussed if pain & heavy bleeding are due to endometriosis definitive diagnosis is surgical. That hormonal management to suppress menses and growth of endometriomas is first recommendation, would like to avoid medication/hormones if possible due to history of side effects. Discussed referral to pelvic pain specialist if unable to improve symptoms with conservative measures.  Meds ordered this encounter  Medications   polyethylene glycol (MIRALAX ) 17 g packet    Sig: Take 17 g by mouth daily.    Dispense:  30 each    Refill:  11   docusate sodium  (COLACE) 100 MG capsule    Sig: Take 1 capsule (100 mg total) by mouth 2 (two) times daily.    Dispense:  60 capsule    Refill:  11     Samariya Rockhold L Milinda Sweeney, CNM

## 2023-07-07 DIAGNOSIS — M25561 Pain in right knee: Secondary | ICD-10-CM | POA: Diagnosis not present

## 2023-07-07 DIAGNOSIS — S86911A Strain of unspecified muscle(s) and tendon(s) at lower leg level, right leg, initial encounter: Secondary | ICD-10-CM | POA: Diagnosis not present

## 2023-07-08 ENCOUNTER — Ambulatory Visit: Payer: Self-pay | Admitting: Certified Nurse Midwife

## 2023-07-08 LAB — NUSWAB VG PLUS+MYCOPLASMAS,NAA
Candida albicans, NAA: NEGATIVE
Candida glabrata, NAA: NEGATIVE
Chlamydia trachomatis, NAA: NEGATIVE
Mycoplasma genitalium NAA: NEGATIVE
Mycoplasma hominis NAA: NEGATIVE
Neisseria gonorrhoeae, NAA: NEGATIVE
Trich vag by NAA: NEGATIVE
Ureaplasma spp NAA: NEGATIVE

## 2023-07-10 LAB — URINE CULTURE

## 2023-07-10 MED ORDER — AMOXICILLIN-POT CLAVULANATE 875-125 MG PO TABS: 1.0000 | ORAL_TABLET | Freq: Two times a day (BID) | ORAL | 0 refills | Status: AC

## 2023-08-03 ENCOUNTER — Telehealth: Payer: Self-pay

## 2023-08-03 NOTE — Telephone Encounter (Signed)
 There was a message saved on my phone but not sure the date the message was left about a TB test and signing a paper. I didn't see the results  in her chart nor a paper, I will call the patient to get more information on this

## 2023-08-03 NOTE — Telephone Encounter (Signed)
 LM for pt to call back, will send mychart message as well

## 2023-09-05 ENCOUNTER — Ambulatory Visit: Attending: Certified Nurse Midwife | Admitting: Physical Therapy

## 2023-09-12 ENCOUNTER — Ambulatory Visit: Admitting: Physical Therapy

## 2023-09-19 ENCOUNTER — Encounter: Admitting: Physical Therapy

## 2023-09-19 DIAGNOSIS — Z20822 Contact with and (suspected) exposure to covid-19: Secondary | ICD-10-CM | POA: Diagnosis not present

## 2023-09-19 DIAGNOSIS — U071 COVID-19: Secondary | ICD-10-CM | POA: Diagnosis not present

## 2023-09-26 ENCOUNTER — Encounter: Admitting: Physical Therapy

## 2023-10-03 ENCOUNTER — Encounter: Admitting: Physical Therapy

## 2023-10-10 ENCOUNTER — Encounter: Admitting: Physical Therapy

## 2023-10-17 ENCOUNTER — Encounter: Admitting: Physical Therapy

## 2023-10-24 ENCOUNTER — Encounter: Admitting: Physical Therapy

## 2023-10-31 ENCOUNTER — Encounter: Admitting: Physical Therapy

## 2023-11-01 ENCOUNTER — Encounter: Payer: Medicaid Other | Admitting: Cardiology

## 2023-11-05 ENCOUNTER — Encounter: Payer: Medicaid Other | Admitting: Cardiology

## 2023-11-07 ENCOUNTER — Encounter: Admitting: Physical Therapy

## 2023-11-14 ENCOUNTER — Encounter: Admitting: Physical Therapy

## 2024-01-17 ENCOUNTER — Encounter: Payer: Self-pay | Admitting: Cardiology

## 2024-01-17 ENCOUNTER — Encounter: Payer: Self-pay | Admitting: Gastroenterology

## 2024-01-17 ENCOUNTER — Ambulatory Visit: Admitting: Cardiology

## 2024-01-17 ENCOUNTER — Other Ambulatory Visit (INDEPENDENT_AMBULATORY_CARE_PROVIDER_SITE_OTHER): Payer: Self-pay

## 2024-01-17 ENCOUNTER — Ambulatory Visit (INDEPENDENT_AMBULATORY_CARE_PROVIDER_SITE_OTHER): Admitting: Cardiology

## 2024-01-17 VITALS — BP 112/78 | HR 131 | Ht 67.0 in | Wt 176.4 lb

## 2024-01-17 DIAGNOSIS — R000 Tachycardia, unspecified: Secondary | ICD-10-CM

## 2024-01-17 DIAGNOSIS — L308 Other specified dermatitis: Secondary | ICD-10-CM

## 2024-01-17 DIAGNOSIS — Z0130 Encounter for examination of blood pressure without abnormal findings: Secondary | ICD-10-CM

## 2024-01-17 DIAGNOSIS — L309 Dermatitis, unspecified: Secondary | ICD-10-CM | POA: Insufficient documentation

## 2024-01-17 MED ORDER — TRIAMCINOLONE ACETONIDE 0.5 % EX OINT: 1.0000 | TOPICAL_OINTMENT | Freq: Two times a day (BID) | CUTANEOUS | 0 refills | Status: DC

## 2024-01-17 MED ORDER — METHYLPREDNISOLONE ACETATE 40 MG/ML IJ SUSP
40.0000 mg | Freq: Once | INTRAMUSCULAR | Status: AC
  Administered 2024-01-17: 40 mg via INTRAMUSCULAR

## 2024-01-17 NOTE — Progress Notes (Signed)
 "  Established Patient Office Visit  Subjective:  Patient ID: Colleen Lang, female    DOB: Aug 01, 1995  Age: 29 y.o. MRN: [id number]  Chief Complaint  Patient presents with   Acute Visit    Rash x 3 weeks     Patient in office for an acute visit, complaining of a rash that started about 3 weeks ago. Patient reports rash is itchy. Rash is diffuse, dry, scaly, erythematous patches. Will give a solu medrol  injection today. Kenalog  ointment sent to the pharmacy. Will send referral to dermatology. Patient heart rate elevated today. Patient reports intermittent episodes of fast heart rate. EKG done in office today, HR improved, HR 95 bpm, NSR. Will refer to cardiology for further evaluation.     No other concerns at this time.   Past Medical History:  Diagnosis Date   AR (allergic rhinitis)    History of chlamydia infection 2014   Ovarian cyst    seen on u/s 6 months ago- per pt they were small   Pelvic pain in female     Past Surgical History:  Procedure Laterality Date   CYST REMOVAL TRUNK Left 06/2021   KNEE SURGERY     x2   TONSILLECTOMY     WISDOM TOOTH EXTRACTION      Social History   Socioeconomic History   Marital status: Married    Spouse name: Not on file   Number of children: Not on file   Years of education: Not on file   Highest education level: Not on file  Occupational History   Not on file  Tobacco Use   Smoking status: Former    Current packs/day: 0.00    Average packs/day: 0.3 packs/day    Types: Cigarettes    Quit date: 04/01/2019    Years since quitting: 4.8   Smokeless tobacco: Former  Building Services Engineer status: Every Day   Substances: Nicotine   Substance and Sexual Activity   Alcohol use: Yes    Comment: rare   Drug use: No   Sexual activity: Yes    Birth control/protection: None  Other Topics Concern   Not on file  Social History Narrative   Not on file   Social Drivers of Health   Tobacco Use: Medium Risk (01/17/2024)   Patient  History    Smoking Tobacco Use: Former    Smokeless Tobacco Use: Former    Passive Exposure: Not on Stage Manager: Not on [employer] Insecurity: Not on file  Transportation Needs: Not on file  Physical Activity: Not on file  Stress: Not on file  Social Connections: Not on file  Intimate Partner Violence: Not on file  Depression (PHQ2-9): Low Risk (09/14/2022)   Depression (PHQ2-9)    PHQ-2 Score: 0  Alcohol Screen: Not on file  Housing: Not on file  Utilities: Not on file  Health Literacy: Not on file    Family History  Problem Relation Age of Onset   Diabetes Paternal Grandmother    Diabetes Paternal Grandfather    Thyroid  disease Mother    Healthy Father    Cancer Neg Hx    Ovarian cancer Neg Hx    Breast cancer Neg Hx    Heart disease Neg Hx     Allergies[1]  Show/hide medication list[2]  Review of Systems  Constitutional: Negative.   HENT: Negative.    Eyes: Negative.   Respiratory: Negative.  Negative for shortness of breath.   Cardiovascular: Negative.  Negative for chest pain.  Gastrointestinal: Negative.  Negative for abdominal pain, constipation and diarrhea.  Genitourinary: Negative.   Musculoskeletal:  Negative for joint pain and myalgias.  Skin:  Positive for itching and rash.  Neurological: Negative.  Negative for dizziness and headaches.  Endo/Heme/Allergies: Negative.   All other systems reviewed and are negative.      Objective:   BP 112/78   Pulse (!) 131   Ht 5' 7 (1.702 m)   Wt 176 lb 6.4 oz (80 kg)   SpO2 99%   BMI 27.63 kg/m   Vitals:   01/17/24 1034  BP: 112/78  Pulse: (!) 131  Height: 5' 7 (1.702 m)  Weight: 176 lb 6.4 oz (80 kg)  SpO2: 99%  BMI (Calculated): 27.62    Physical Exam Vitals and nursing note reviewed.  Constitutional:      Appearance: Normal appearance. She is normal weight.   HENT:     Head: Normocephalic and atraumatic.     Nose: Nose normal.     Mouth/Throat:     Mouth: Mucous  membranes are moist.  Eyes:     Extraocular Movements: Extraocular movements intact.     Conjunctiva/sclera: Conjunctivae normal.     Pupils: Pupils are equal, round, and reactive to light.  Cardiovascular:     Rate and Rhythm: Regular rhythm. Tachycardia present.     Pulses: Normal pulses.     Heart sounds: Normal heart sounds.  Pulmonary:     Effort: Pulmonary effort is normal.     Breath sounds: Normal breath sounds.  Abdominal:     General: Abdomen is flat. Bowel sounds are normal.     Palpations: Abdomen is soft.  Musculoskeletal:        General: Normal range of motion.     Cervical back: Normal range of motion.  Skin:    General: Skin is warm and dry.  Neurological:     General: No focal deficit present.     Mental Status: She is alert and oriented to person, place, and time.  Psychiatric:        Mood and Affect: Mood normal.        Behavior: Behavior normal.        Thought Content: Thought content normal.        Judgment: Judgment normal.      No results found for any visits on 01/17/24.  No results found for this or any previous visit (from the past 2160 hours).    Assessment & Plan:  Solu medrol  injection today Kenalog  cream Referral sent to dermatology Referral sent to cardiology  Problem List Items Addressed This Visit       Musculoskeletal and Integument   Eczema - Primary   Relevant Orders   Ambulatory referral to Dermatology     Other   Sinus tachycardia   Relevant Orders   Ambulatory referral to Cardiology    Return if symptoms worsen or fail to improve, for as scheduled.   Total time spent: 25 minutes. This time includes review of previous notes and results and patient face to face interaction during today's visit.    Jeoffrey Pollen, NP  01/17/2024   This document may have been prepared by [facility] Voice Recognition software and as such may include unintentional dictation errors.     [1]  Allergies Allergen Reactions   Tramadol  Anaphylaxis   Ultram [Tramadol Hcl] Anaphylaxis    Hard to breathe   Other Rash    honey  [  2]  Outpatient Medications Prior to Visit  Medication Sig   ibuprofen  (ADVIL ) 800 MG tablet Take 1 tablet (800 mg total) by mouth every 8 (eight) hours as needed for mild pain (pain score 1-3), moderate pain (pain score 4-6) or cramping.   docusate sodium  (COLACE) 100 MG capsule Take 1 capsule (100 mg total) by mouth 2 (two) times daily.   phentermine (ADIPEX-P) 37.5 MG tablet Take 37.5 mg by mouth daily before breakfast. (Patient not taking: Reported on 01/17/2024)   polyethylene glycol (MIRALAX ) 17 g packet Take 17 g by mouth daily.   WEGOVY 0.25 MG/0.5ML SOAJ SMARTSIG:0.25 Milligram(s) SUB-Q Once a Week (Patient not taking: Reported on 07/05/2023)   [DISCONTINUED] ondansetron  (ZOFRAN -ODT) 4 MG disintegrating tablet Take 1 tablet (4 mg total) by mouth every 8 (eight) hours as needed for nausea or vomiting. (Patient not taking: Reported on 07/05/2023)   No facility-administered medications prior to visit.   "

## 2024-01-22 ENCOUNTER — Ambulatory Visit

## 2024-01-22 DIAGNOSIS — R21 Rash and other nonspecific skin eruption: Secondary | ICD-10-CM

## 2024-01-22 DIAGNOSIS — L408 Other psoriasis: Secondary | ICD-10-CM | POA: Diagnosis not present

## 2024-01-22 DIAGNOSIS — L409 Psoriasis, unspecified: Secondary | ICD-10-CM

## 2024-01-22 MED ORDER — TACROLIMUS 0.1 % EX OINT: TOPICAL_OINTMENT | CUTANEOUS | 0 refills | Status: AC

## 2024-01-22 MED ORDER — TRIAMCINOLONE ACETONIDE 0.1 % EX OINT: TOPICAL_OINTMENT | CUTANEOUS | 0 refills | Status: AC

## 2024-01-22 MED ORDER — CLOBETASOL PROPIONATE 0.05 % EX SOLN: CUTANEOUS | 2 refills | Status: AC

## 2024-01-22 NOTE — Progress Notes (Signed)
" °  °  Subjective   Colleen Lang is a 29 y.o. female who presents for the following: Rash. Patient is new patient  Today patient reports: Rash x 3 weeks - no illness/surgeries, has experienced stress, no new or changing laundry detergents/shampoos/body washes or medications, pt has a dog the mostly indoors, no one else in the home has a rash, no recent travels. PCP gave IM kenalog  and TMC 0.5% ointment currently using BID, fhx of psoriasis and eczema in grandmother. Rash is very itchy and medications haven't helped with symptoms  Review of Systems:    No other skin or systemic complaints except as noted in HPI or Assessment and Plan.  The following portions of the chart were reviewed this encounter and updated as appropriate: medications, allergies, medical history  Relevant Medical History:  n/a   Objective  (SKPE) Well appearing patient in no apparent distress; mood and affect are within normal limits. 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 Condition is symptomatic and bothersome to patient. Patient is flaring and not currently at treatment goal.  Start clobetasol  0.05% solution daily to scalp Discussed side effect of super potent topical steroids including atrophy, dyspigmentation, striae, telangectasia, folliculitis, loss of skin pigment, hair growth, tachyphylaxis, risk of systemic absorption with missuse. Recommend T-Sal shampoo daily.  Pruritic eruption of neck, axilla - favor contact dermatitis vs eczema - Discussed seems like different process than above - advised can consider bx pending resposne to topicals  - Diagnosis, treatment options,  prognosis, risk/ benefit, and side effects of treatment were discussed with the patient.  - Reviewed benign but chronic nature of disease. - Discussed dry skin care at length, recommended avoidance of fragrances, short showers with luke- warm water, no scrubbing, an unscented moisturizing soap (e.g. Dove sensitive skin) limited to the groin and axillae, and frequent emollient use (Eucerin, Aquaphor, Cerave, Vanicream, Vaseline). - Reviewed proper use of topical steroids to minimize the risk of steroid-induced skin changes.  - Also discussed appropriate dry skin care including daily warm baths with gentle soap, followed by liberal bland moisturizer application.  - A patient education handout reiterating this information was provided. - For mild areas: start triamcinolone  0.1% ointment twice daily x 2 weeks - For maintenance: start tacrolimus  0.1% ointment twice daily PRN.  Was sun protection counseling provided?: No   Level of service outlined above   Patient instructions (SKPI)   Procedures, orders, diagnosis for this visit:    There are no diagnoses linked to this encounter.  Return to clinic: Return in about 6 weeks (around 03/04/2024) for rash follow up.  LILLETTE Rosina Mayans, CMA, am acting as scribe for Lauraine JAYSON Kanaris, MD .   Documentation: I have reviewed the above documentation for accuracy and completeness, and I agree with the above.  Lauraine JAYSON Kanaris, MD  "

## 2024-01-22 NOTE — Patient Instructions (Addendum)
 - For neck and axilla: start triamcinolone  0.1% ointment twice daily x 2 weeks then transition to tacrolimus  ointment bid for maintenance    Start clobetasol  0.05% solution daily to scalp  Due to recent changes in healthcare laws, you may see results of your pathology and/or laboratory studies on MyChart before the doctors have had a chance to review them. We understand that in some cases there may be results that are confusing or concerning to you. Please understand that not all results are received at the same time and often the doctors may need to interpret multiple results in order to provide you with the best plan of care or course of treatment. Therefore, we ask that you please give us  2 business days to thoroughly review all your results before contacting the office for clarification. Should we see a critical lab result, you will be contacted sooner.   If You Need Anything After Your Visit  If you have any questions or concerns for your doctor, please call our main line at [phone] and press option 4 to reach your doctor's medical assistant. If no one answers, please leave a voicemail as directed and we will return your call as soon as possible. Messages left after 4 pm will be answered the following business day.   You may also send us  a message via MyChart. We typically respond to MyChart messages within 1-2 business days.  For prescription refills, please ask your pharmacy to contact our office. Our fax number is [phone].  If you have an urgent issue when the clinic is closed that cannot wait until the next business day, you can page your doctor at the number below.    Please note that while we do our best to be available for urgent issues outside of office hours, we are not available 24/7.   If you have an urgent issue and are unable to reach us , you may choose to seek medical care at your doctor's office, retail clinic, urgent care center, or emergency room.  If you have a  medical emergency, please immediately call 911 or go to the emergency department.  Pager Numbers  - Dr. Hester: [phone]  - Dr. Jackquline: [phone]  - Dr. Claudene: [phone]   - Dr. Raymund: [phone]  In the event of inclement weather, please call our main line at [phone] for an update on the status of any delays or closures.  Dermatology Medication Tips: Please keep the boxes that topical medications come in in order to help keep track of the instructions about where and how to use these. Pharmacies typically print the medication instructions only on the boxes and not directly on the medication tubes.   If your medication is too expensive, please contact our office at [phone] option 4 or send us  a message through MyChart.   We are unable to tell what your co-pay for medications will be in advance as this is different depending on your insurance coverage. However, we may be able to find a substitute medication at lower cost or fill out paperwork to get insurance to cover a needed medication.   If a prior authorization is required to get your medication covered by your insurance company, please allow us  1-2 business days to complete this process.  Drug prices often vary depending on where the prescription is filled and some pharmacies may offer cheaper prices.  The website www.goodrx.com contains coupons for medications through different pharmacies. The prices here do not account for what the cost may  be with help from insurance (it may be cheaper with your insurance), but the website can give you the price if you did not use any insurance.  - You can print the associated coupon and take it with your prescription to the pharmacy.  - You may also stop by our office during regular business hours and pick up a GoodRx coupon card.  - If you need your prescription sent electronically to a different pharmacy, notify our office through [facility] or by phone at  [phone] option 4.     Si Usted Necesita Algo Despus de Su Visita  Tambin puede enviarnos un mensaje a travs de Clinical Cytogeneticist. Por lo general respondemos a los mensajes de MyChart en el transcurso de 1 a 2 das hbiles.  Para renovar recetas, por favor pida a su farmacia que se ponga en contacto con nuestra oficina. Randi lakes de fax es [place] [phone].  Si tiene un asunto urgente cuando la clnica est cerrada y que no puede esperar hasta el siguiente da hbil, puede llamar/localizar a su doctor(a) al nmero que aparece a continuacin.   Por favor, tenga en cuenta que aunque hacemos todo lo posible para estar disponibles para asuntos urgentes fuera del horario de [place], no estamos disponibles las 24 horas del da, los 7 [address]  [address] de la [place].   Si tiene un problema urgente y no puede comunicarse con nosotros, puede optar por buscar atencin mdica  en el consultorio de su doctor(a), en una clnica privada, en un centro de atencin urgente o en una sala de emergencias.  Si tiene engineer, drilling, por favor llame inmediatamente al 911 o vaya a la sala de emergencias.  Nmeros de bper  - Dr. Hester: [phone]  - Dra. Jackquline: [phone]  - Dr. Claudene: [phone]  - Dra. Yelitza Reach: [phone]  En caso de inclemencias del [place], por favor llame a nuestra lnea principal al [phone] para una actualizacin sobre el estado de cualquier retraso o cierre.  Consejos para la medicacin en dermatologa: Por favor, guarde las cajas en las que vienen los medicamentos de uso tpico para ayudarle a seguir las instrucciones sobre dnde y cmo usarlos. Las farmacias generalmente imprimen las instrucciones del medicamento slo en las cajas y no directamente en los tubos del [place].   Si su medicamento es muy caro, por favor, pngase en contacto con landry rieger llamando al [phone] y presione la opcin 4 o envenos un mensaje a travs de Clinical Cytogeneticist.   No podemos decirle  cul ser su copago por los medicamentos por adelantado ya que esto es diferente dependiendo de la cobertura de su seguro. Sin embargo, es posible que podamos encontrar un medicamento sustituto a audiological scientist un formulario para que el seguro cubra el medicamento que se considera necesario.   Si se requiere una autorizacin previa para que su compaa de seguros cubra su medicamento, por favor permtanos de 1 a 2 das hbiles para completar este proceso.  Los precios de los medicamentos varan con frecuencia dependiendo del environmental consultant de dnde se surte la receta y alguna farmacias pueden ofrecer precios ms baratos.  El sitio web www.goodrx.com tiene cupones para medicamentos de health and safety inspector. Los precios aqu no tienen en cuenta lo que podra costar con la ayuda del seguro (puede ser ms barato con su seguro), pero el sitio web puede darle el precio si no utiliz tourist information centre manager.  - Puede imprimir el cupn correspondiente y llevarlo con su receta a la farmacia.  - Tambin  puede pasar por nuestra oficina durante el horario de atencin regular y education officer, museum una tarjeta de cupones de GoodRx.  - Si necesita que su receta se enve electrnicamente a una farmacia diferente, informe a nuestra oficina a travs de MyChart de [place] o por telfono llamando al [phone] y presione la opcin 4.

## 2024-03-12 ENCOUNTER — Ambulatory Visit
